# Patient Record
Sex: Male | Born: 1940 | Race: White | Hispanic: No | Marital: Married | Smoking: Former smoker
Health system: Southern US, Community
[De-identification: ages and names within clinical notes are randomized; demographics above are authoritative.]

## PROBLEM LIST (undated history)

## (undated) DIAGNOSIS — I469 Cardiac arrest, cause unspecified: Secondary | ICD-10-CM

## (undated) DIAGNOSIS — M1990 Unspecified osteoarthritis, unspecified site: Secondary | ICD-10-CM

## (undated) DIAGNOSIS — I10 Essential (primary) hypertension: Secondary | ICD-10-CM

## (undated) DIAGNOSIS — M25561 Pain in right knee: Secondary | ICD-10-CM

## (undated) DIAGNOSIS — B1920 Unspecified viral hepatitis C without hepatic coma: Secondary | ICD-10-CM

## (undated) DIAGNOSIS — Z205 Contact with and (suspected) exposure to viral hepatitis: Secondary | ICD-10-CM

## (undated) DIAGNOSIS — E119 Type 2 diabetes mellitus without complications: Secondary | ICD-10-CM

## (undated) DIAGNOSIS — Z9189 Other specified personal risk factors, not elsewhere classified: Secondary | ICD-10-CM

## (undated) DIAGNOSIS — R339 Retention of urine, unspecified: Secondary | ICD-10-CM

## (undated) DIAGNOSIS — K219 Gastro-esophageal reflux disease without esophagitis: Secondary | ICD-10-CM

## (undated) DIAGNOSIS — I2699 Other pulmonary embolism without acute cor pulmonale: Secondary | ICD-10-CM

## (undated) DIAGNOSIS — K7469 Other cirrhosis of liver: Secondary | ICD-10-CM

## (undated) DIAGNOSIS — K759 Inflammatory liver disease, unspecified: Secondary | ICD-10-CM

## (undated) DIAGNOSIS — M1712 Unilateral primary osteoarthritis, left knee: Secondary | ICD-10-CM

## (undated) DIAGNOSIS — I421 Obstructive hypertrophic cardiomyopathy: Secondary | ICD-10-CM

## (undated) DIAGNOSIS — M25562 Pain in left knee: Secondary | ICD-10-CM

## (undated) DIAGNOSIS — Z96659 Presence of unspecified artificial knee joint: Secondary | ICD-10-CM

## (undated) DIAGNOSIS — Z7901 Long term (current) use of anticoagulants: Secondary | ICD-10-CM

## (undated) DIAGNOSIS — I2510 Atherosclerotic heart disease of native coronary artery without angina pectoris: Secondary | ICD-10-CM

## (undated) DIAGNOSIS — N179 Acute kidney failure, unspecified: Secondary | ICD-10-CM

## (undated) HISTORY — DX: Contact with and (suspected) exposure to viral hepatitis: Z20.5

## (undated) HISTORY — PX: TONSILLECTOMY: SUR1361

## (undated) HISTORY — DX: Pain in right knee: M25.561

## (undated) HISTORY — DX: Pain in left knee: M25.562

## (undated) HISTORY — DX: Other specified personal risk factors, not elsewhere classified: Z91.89

## (undated) HISTORY — DX: Essential (primary) hypertension: I10

## (undated) HISTORY — DX: Unilateral primary osteoarthritis, left knee: M17.12

## (undated) HISTORY — DX: Atherosclerotic heart disease of native coronary artery without angina pectoris: I25.10

## (undated) HISTORY — DX: Presence of unspecified artificial knee joint: Z96.659

## (undated) HISTORY — DX: Other cirrhosis of liver: K74.69

## (undated) HISTORY — DX: Obstructive hypertrophic cardiomyopathy: I42.1

## (undated) HISTORY — PX: CARDIAC CATHETERIZATION: SHX172

## (undated) HISTORY — DX: Unspecified viral hepatitis C without hepatic coma: B19.20

## (undated) HISTORY — DX: Acute kidney failure, unspecified: N17.9

## (undated) HISTORY — DX: Cardiac arrest, cause unspecified: I46.9

## (undated) HISTORY — DX: Long term (current) use of anticoagulants: Z79.01

## (undated) HISTORY — DX: Other pulmonary embolism without acute cor pulmonale: I26.99

---

## 2011-02-19 DIAGNOSIS — K2270 Barrett's esophagus without dysplasia: Secondary | ICD-10-CM | POA: Diagnosis not present

## 2011-03-06 DIAGNOSIS — B182 Chronic viral hepatitis C: Secondary | ICD-10-CM | POA: Diagnosis not present

## 2011-03-06 DIAGNOSIS — K449 Diaphragmatic hernia without obstruction or gangrene: Secondary | ICD-10-CM | POA: Diagnosis not present

## 2011-03-06 DIAGNOSIS — Z79899 Other long term (current) drug therapy: Secondary | ICD-10-CM | POA: Diagnosis not present

## 2011-03-06 DIAGNOSIS — Z7982 Long term (current) use of aspirin: Secondary | ICD-10-CM | POA: Diagnosis not present

## 2011-03-06 DIAGNOSIS — K219 Gastro-esophageal reflux disease without esophagitis: Secondary | ICD-10-CM | POA: Diagnosis not present

## 2011-03-06 DIAGNOSIS — Z9861 Coronary angioplasty status: Secondary | ICD-10-CM | POA: Diagnosis not present

## 2011-03-06 DIAGNOSIS — Z7902 Long term (current) use of antithrombotics/antiplatelets: Secondary | ICD-10-CM | POA: Diagnosis not present

## 2011-03-06 DIAGNOSIS — E119 Type 2 diabetes mellitus without complications: Secondary | ICD-10-CM | POA: Diagnosis not present

## 2011-03-06 DIAGNOSIS — I10 Essential (primary) hypertension: Secondary | ICD-10-CM | POA: Diagnosis not present

## 2011-03-06 DIAGNOSIS — K2970 Gastritis, unspecified, without bleeding: Secondary | ICD-10-CM | POA: Diagnosis not present

## 2011-05-07 DIAGNOSIS — E1159 Type 2 diabetes mellitus with other circulatory complications: Secondary | ICD-10-CM | POA: Diagnosis not present

## 2011-05-07 DIAGNOSIS — Z1212 Encounter for screening for malignant neoplasm of rectum: Secondary | ICD-10-CM | POA: Diagnosis not present

## 2011-05-07 DIAGNOSIS — Z125 Encounter for screening for malignant neoplasm of prostate: Secondary | ICD-10-CM | POA: Diagnosis not present

## 2011-05-07 DIAGNOSIS — Z0000 Encounter for general adult medical examination without abnormal findings: Secondary | ICD-10-CM | POA: Diagnosis not present

## 2011-05-07 DIAGNOSIS — Z23 Encounter for immunization: Secondary | ICD-10-CM | POA: Diagnosis not present

## 2011-05-07 DIAGNOSIS — E785 Hyperlipidemia, unspecified: Secondary | ICD-10-CM | POA: Diagnosis not present

## 2011-06-22 DIAGNOSIS — R5383 Other fatigue: Secondary | ICD-10-CM | POA: Diagnosis not present

## 2011-06-22 DIAGNOSIS — R3919 Other difficulties with micturition: Secondary | ICD-10-CM | POA: Diagnosis not present

## 2011-06-22 DIAGNOSIS — E7800 Pure hypercholesterolemia, unspecified: Secondary | ICD-10-CM | POA: Diagnosis not present

## 2011-06-22 DIAGNOSIS — I10 Essential (primary) hypertension: Secondary | ICD-10-CM | POA: Diagnosis not present

## 2011-06-22 DIAGNOSIS — R5381 Other malaise: Secondary | ICD-10-CM | POA: Diagnosis not present

## 2011-06-22 DIAGNOSIS — E119 Type 2 diabetes mellitus without complications: Secondary | ICD-10-CM | POA: Diagnosis not present

## 2011-06-22 DIAGNOSIS — I6789 Other cerebrovascular disease: Secondary | ICD-10-CM | POA: Diagnosis not present

## 2011-06-22 DIAGNOSIS — I2510 Atherosclerotic heart disease of native coronary artery without angina pectoris: Secondary | ICD-10-CM | POA: Diagnosis not present

## 2011-06-22 DIAGNOSIS — R55 Syncope and collapse: Secondary | ICD-10-CM | POA: Diagnosis not present

## 2011-06-22 DIAGNOSIS — R6889 Other general symptoms and signs: Secondary | ICD-10-CM | POA: Diagnosis not present

## 2011-06-22 DIAGNOSIS — I252 Old myocardial infarction: Secondary | ICD-10-CM | POA: Diagnosis not present

## 2011-06-23 DIAGNOSIS — Z6827 Body mass index (BMI) 27.0-27.9, adult: Secondary | ICD-10-CM | POA: Diagnosis not present

## 2011-06-23 DIAGNOSIS — E119 Type 2 diabetes mellitus without complications: Secondary | ICD-10-CM | POA: Diagnosis not present

## 2011-06-23 DIAGNOSIS — R55 Syncope and collapse: Secondary | ICD-10-CM | POA: Diagnosis not present

## 2011-06-23 DIAGNOSIS — I10 Essential (primary) hypertension: Secondary | ICD-10-CM | POA: Diagnosis not present

## 2011-06-25 DIAGNOSIS — B1920 Unspecified viral hepatitis C without hepatic coma: Secondary | ICD-10-CM | POA: Diagnosis not present

## 2011-06-25 DIAGNOSIS — B182 Chronic viral hepatitis C: Secondary | ICD-10-CM | POA: Diagnosis not present

## 2011-06-26 DIAGNOSIS — D539 Nutritional anemia, unspecified: Secondary | ICD-10-CM | POA: Diagnosis not present

## 2011-06-26 DIAGNOSIS — R0609 Other forms of dyspnea: Secondary | ICD-10-CM | POA: Diagnosis not present

## 2011-06-26 DIAGNOSIS — R55 Syncope and collapse: Secondary | ICD-10-CM | POA: Diagnosis not present

## 2011-06-27 DIAGNOSIS — I421 Obstructive hypertrophic cardiomyopathy: Secondary | ICD-10-CM | POA: Diagnosis not present

## 2011-06-27 DIAGNOSIS — R55 Syncope and collapse: Secondary | ICD-10-CM | POA: Diagnosis not present

## 2011-06-27 DIAGNOSIS — I209 Angina pectoris, unspecified: Secondary | ICD-10-CM | POA: Diagnosis not present

## 2011-06-27 DIAGNOSIS — E119 Type 2 diabetes mellitus without complications: Secondary | ICD-10-CM | POA: Diagnosis not present

## 2011-06-30 DIAGNOSIS — R55 Syncope and collapse: Secondary | ICD-10-CM | POA: Diagnosis not present

## 2011-06-30 DIAGNOSIS — R0989 Other specified symptoms and signs involving the circulatory and respiratory systems: Secondary | ICD-10-CM | POA: Diagnosis not present

## 2011-06-30 DIAGNOSIS — I059 Rheumatic mitral valve disease, unspecified: Secondary | ICD-10-CM | POA: Diagnosis not present

## 2011-06-30 DIAGNOSIS — R0609 Other forms of dyspnea: Secondary | ICD-10-CM | POA: Diagnosis not present

## 2011-06-30 DIAGNOSIS — Z8249 Family history of ischemic heart disease and other diseases of the circulatory system: Secondary | ICD-10-CM | POA: Diagnosis not present

## 2011-06-30 DIAGNOSIS — E119 Type 2 diabetes mellitus without complications: Secondary | ICD-10-CM | POA: Diagnosis not present

## 2011-06-30 DIAGNOSIS — I421 Obstructive hypertrophic cardiomyopathy: Secondary | ICD-10-CM | POA: Diagnosis not present

## 2011-06-30 DIAGNOSIS — I10 Essential (primary) hypertension: Secondary | ICD-10-CM | POA: Diagnosis not present

## 2011-06-30 DIAGNOSIS — Z9861 Coronary angioplasty status: Secondary | ICD-10-CM | POA: Diagnosis not present

## 2011-06-30 DIAGNOSIS — Z79899 Other long term (current) drug therapy: Secondary | ICD-10-CM | POA: Diagnosis not present

## 2011-06-30 DIAGNOSIS — D649 Anemia, unspecified: Secondary | ICD-10-CM | POA: Diagnosis not present

## 2011-06-30 DIAGNOSIS — E785 Hyperlipidemia, unspecified: Secondary | ICD-10-CM | POA: Diagnosis not present

## 2011-06-30 DIAGNOSIS — Z7982 Long term (current) use of aspirin: Secondary | ICD-10-CM | POA: Diagnosis not present

## 2011-06-30 DIAGNOSIS — I2510 Atherosclerotic heart disease of native coronary artery without angina pectoris: Secondary | ICD-10-CM | POA: Diagnosis not present

## 2011-06-30 DIAGNOSIS — I209 Angina pectoris, unspecified: Secondary | ICD-10-CM | POA: Diagnosis not present

## 2011-07-02 DIAGNOSIS — R55 Syncope and collapse: Secondary | ICD-10-CM | POA: Diagnosis not present

## 2011-07-03 DIAGNOSIS — R55 Syncope and collapse: Secondary | ICD-10-CM | POA: Diagnosis not present

## 2011-07-03 DIAGNOSIS — I2510 Atherosclerotic heart disease of native coronary artery without angina pectoris: Secondary | ICD-10-CM | POA: Diagnosis not present

## 2011-07-03 DIAGNOSIS — I421 Obstructive hypertrophic cardiomyopathy: Secondary | ICD-10-CM | POA: Diagnosis not present

## 2011-07-03 DIAGNOSIS — E785 Hyperlipidemia, unspecified: Secondary | ICD-10-CM | POA: Diagnosis not present

## 2011-07-08 DIAGNOSIS — Z6826 Body mass index (BMI) 26.0-26.9, adult: Secondary | ICD-10-CM | POA: Diagnosis not present

## 2011-07-08 DIAGNOSIS — I10 Essential (primary) hypertension: Secondary | ICD-10-CM | POA: Diagnosis not present

## 2011-07-18 DIAGNOSIS — A419 Sepsis, unspecified organism: Secondary | ICD-10-CM | POA: Diagnosis not present

## 2011-07-18 DIAGNOSIS — R6520 Severe sepsis without septic shock: Secondary | ICD-10-CM | POA: Diagnosis not present

## 2011-07-18 DIAGNOSIS — D649 Anemia, unspecified: Secondary | ICD-10-CM | POA: Diagnosis not present

## 2011-07-18 DIAGNOSIS — I369 Nonrheumatic tricuspid valve disorder, unspecified: Secondary | ICD-10-CM | POA: Diagnosis not present

## 2011-07-18 DIAGNOSIS — D61818 Other pancytopenia: Secondary | ICD-10-CM | POA: Diagnosis not present

## 2011-07-18 DIAGNOSIS — E1159 Type 2 diabetes mellitus with other circulatory complications: Secondary | ICD-10-CM | POA: Diagnosis not present

## 2011-07-18 DIAGNOSIS — I059 Rheumatic mitral valve disease, unspecified: Secondary | ICD-10-CM | POA: Diagnosis not present

## 2011-07-18 DIAGNOSIS — E785 Hyperlipidemia, unspecified: Secondary | ICD-10-CM | POA: Diagnosis present

## 2011-07-18 DIAGNOSIS — J9620 Acute and chronic respiratory failure, unspecified whether with hypoxia or hypercapnia: Secondary | ICD-10-CM | POA: Diagnosis not present

## 2011-07-18 DIAGNOSIS — B182 Chronic viral hepatitis C: Secondary | ICD-10-CM | POA: Diagnosis not present

## 2011-07-18 DIAGNOSIS — E119 Type 2 diabetes mellitus without complications: Secondary | ICD-10-CM | POA: Diagnosis not present

## 2011-07-18 DIAGNOSIS — N289 Disorder of kidney and ureter, unspecified: Secondary | ICD-10-CM | POA: Diagnosis not present

## 2011-07-18 DIAGNOSIS — R079 Chest pain, unspecified: Secondary | ICD-10-CM | POA: Diagnosis not present

## 2011-07-18 DIAGNOSIS — J9610 Chronic respiratory failure, unspecified whether with hypoxia or hypercapnia: Secondary | ICD-10-CM | POA: Diagnosis not present

## 2011-07-18 DIAGNOSIS — J449 Chronic obstructive pulmonary disease, unspecified: Secondary | ICD-10-CM | POA: Diagnosis not present

## 2011-07-18 DIAGNOSIS — D509 Iron deficiency anemia, unspecified: Secondary | ICD-10-CM | POA: Diagnosis not present

## 2011-07-18 DIAGNOSIS — I359 Nonrheumatic aortic valve disorder, unspecified: Secondary | ICD-10-CM | POA: Diagnosis not present

## 2011-07-18 DIAGNOSIS — I10 Essential (primary) hypertension: Secondary | ICD-10-CM | POA: Diagnosis present

## 2011-07-18 DIAGNOSIS — A779 Spotted fever, unspecified: Secondary | ICD-10-CM | POA: Diagnosis not present

## 2011-07-18 DIAGNOSIS — Z9861 Coronary angioplasty status: Secondary | ICD-10-CM | POA: Diagnosis not present

## 2011-07-18 DIAGNOSIS — I2510 Atherosclerotic heart disease of native coronary artery without angina pectoris: Secondary | ICD-10-CM | POA: Diagnosis not present

## 2011-07-18 DIAGNOSIS — D61810 Antineoplastic chemotherapy induced pancytopenia: Secondary | ICD-10-CM | POA: Diagnosis not present

## 2011-07-18 DIAGNOSIS — R509 Fever, unspecified: Secondary | ICD-10-CM | POA: Diagnosis not present

## 2011-07-18 DIAGNOSIS — J4489 Other specified chronic obstructive pulmonary disease: Secondary | ICD-10-CM | POA: Diagnosis not present

## 2011-07-18 DIAGNOSIS — D696 Thrombocytopenia, unspecified: Secondary | ICD-10-CM | POA: Diagnosis not present

## 2011-07-18 DIAGNOSIS — D469 Myelodysplastic syndrome, unspecified: Secondary | ICD-10-CM | POA: Diagnosis not present

## 2011-07-21 ENCOUNTER — Other Ambulatory Visit (HOSPITAL_COMMUNITY): Admission: RE | Admit: 2011-07-21 | Payer: Self-pay | Source: Ambulatory Visit | Admitting: Oncology

## 2011-07-21 DIAGNOSIS — D649 Anemia, unspecified: Secondary | ICD-10-CM | POA: Diagnosis not present

## 2011-07-30 DIAGNOSIS — A779 Spotted fever, unspecified: Secondary | ICD-10-CM | POA: Diagnosis not present

## 2011-07-30 DIAGNOSIS — Z6825 Body mass index (BMI) 25.0-25.9, adult: Secondary | ICD-10-CM | POA: Diagnosis not present

## 2011-07-31 DIAGNOSIS — R55 Syncope and collapse: Secondary | ICD-10-CM | POA: Diagnosis not present

## 2011-08-05 DIAGNOSIS — I2510 Atherosclerotic heart disease of native coronary artery without angina pectoris: Secondary | ICD-10-CM | POA: Diagnosis not present

## 2011-08-05 DIAGNOSIS — Q068 Other specified congenital malformations of spinal cord: Secondary | ICD-10-CM | POA: Diagnosis not present

## 2011-08-05 DIAGNOSIS — D61818 Other pancytopenia: Secondary | ICD-10-CM | POA: Diagnosis not present

## 2011-08-05 DIAGNOSIS — I421 Obstructive hypertrophic cardiomyopathy: Secondary | ICD-10-CM | POA: Diagnosis not present

## 2011-08-07 DIAGNOSIS — D61818 Other pancytopenia: Secondary | ICD-10-CM | POA: Diagnosis not present

## 2011-08-07 DIAGNOSIS — D649 Anemia, unspecified: Secondary | ICD-10-CM | POA: Diagnosis not present

## 2011-08-22 DIAGNOSIS — E785 Hyperlipidemia, unspecified: Secondary | ICD-10-CM | POA: Diagnosis not present

## 2011-08-22 DIAGNOSIS — I10 Essential (primary) hypertension: Secondary | ICD-10-CM | POA: Diagnosis not present

## 2011-08-22 DIAGNOSIS — Z6825 Body mass index (BMI) 25.0-25.9, adult: Secondary | ICD-10-CM | POA: Diagnosis not present

## 2011-08-22 DIAGNOSIS — E119 Type 2 diabetes mellitus without complications: Secondary | ICD-10-CM | POA: Diagnosis not present

## 2011-10-16 DIAGNOSIS — D61818 Other pancytopenia: Secondary | ICD-10-CM | POA: Diagnosis not present

## 2011-10-16 DIAGNOSIS — B1920 Unspecified viral hepatitis C without hepatic coma: Secondary | ICD-10-CM | POA: Diagnosis not present

## 2011-10-28 DIAGNOSIS — H35379 Puckering of macula, unspecified eye: Secondary | ICD-10-CM | POA: Diagnosis not present

## 2011-10-28 DIAGNOSIS — H538 Other visual disturbances: Secondary | ICD-10-CM | POA: Diagnosis not present

## 2011-11-18 DIAGNOSIS — H35379 Puckering of macula, unspecified eye: Secondary | ICD-10-CM | POA: Diagnosis not present

## 2011-11-24 DIAGNOSIS — I10 Essential (primary) hypertension: Secondary | ICD-10-CM | POA: Diagnosis not present

## 2011-11-24 DIAGNOSIS — D539 Nutritional anemia, unspecified: Secondary | ICD-10-CM | POA: Diagnosis not present

## 2011-11-24 DIAGNOSIS — E785 Hyperlipidemia, unspecified: Secondary | ICD-10-CM | POA: Diagnosis not present

## 2011-11-24 DIAGNOSIS — E119 Type 2 diabetes mellitus without complications: Secondary | ICD-10-CM | POA: Diagnosis not present

## 2011-11-25 DIAGNOSIS — I2510 Atherosclerotic heart disease of native coronary artery without angina pectoris: Secondary | ICD-10-CM | POA: Diagnosis not present

## 2011-11-25 DIAGNOSIS — I10 Essential (primary) hypertension: Secondary | ICD-10-CM | POA: Diagnosis not present

## 2011-11-25 DIAGNOSIS — I421 Obstructive hypertrophic cardiomyopathy: Secondary | ICD-10-CM | POA: Diagnosis not present

## 2011-12-23 DIAGNOSIS — Z23 Encounter for immunization: Secondary | ICD-10-CM | POA: Diagnosis not present

## 2011-12-23 DIAGNOSIS — B1920 Unspecified viral hepatitis C without hepatic coma: Secondary | ICD-10-CM | POA: Diagnosis not present

## 2011-12-23 DIAGNOSIS — Z79899 Other long term (current) drug therapy: Secondary | ICD-10-CM | POA: Diagnosis not present

## 2012-01-06 DIAGNOSIS — H348392 Tributary (branch) retinal vein occlusion, unspecified eye, stable: Secondary | ICD-10-CM | POA: Diagnosis not present

## 2012-02-26 DIAGNOSIS — E119 Type 2 diabetes mellitus without complications: Secondary | ICD-10-CM | POA: Diagnosis not present

## 2012-02-26 DIAGNOSIS — I10 Essential (primary) hypertension: Secondary | ICD-10-CM | POA: Diagnosis not present

## 2012-02-26 DIAGNOSIS — E785 Hyperlipidemia, unspecified: Secondary | ICD-10-CM | POA: Diagnosis not present

## 2012-02-26 DIAGNOSIS — Z6826 Body mass index (BMI) 26.0-26.9, adult: Secondary | ICD-10-CM | POA: Diagnosis not present

## 2012-03-04 DIAGNOSIS — I10 Essential (primary) hypertension: Secondary | ICD-10-CM | POA: Diagnosis not present

## 2012-03-10 DIAGNOSIS — K219 Gastro-esophageal reflux disease without esophagitis: Secondary | ICD-10-CM | POA: Diagnosis not present

## 2012-04-07 DIAGNOSIS — H35379 Puckering of macula, unspecified eye: Secondary | ICD-10-CM | POA: Diagnosis not present

## 2012-05-10 DIAGNOSIS — T148 Other injury of unspecified body region: Secondary | ICD-10-CM | POA: Diagnosis not present

## 2012-05-10 DIAGNOSIS — Z6827 Body mass index (BMI) 27.0-27.9, adult: Secondary | ICD-10-CM | POA: Diagnosis not present

## 2012-05-10 DIAGNOSIS — W57XXXA Bitten or stung by nonvenomous insect and other nonvenomous arthropods, initial encounter: Secondary | ICD-10-CM | POA: Diagnosis not present

## 2012-06-02 DIAGNOSIS — I10 Essential (primary) hypertension: Secondary | ICD-10-CM | POA: Diagnosis not present

## 2012-06-02 DIAGNOSIS — E785 Hyperlipidemia, unspecified: Secondary | ICD-10-CM | POA: Diagnosis not present

## 2012-06-02 DIAGNOSIS — E119 Type 2 diabetes mellitus without complications: Secondary | ICD-10-CM | POA: Diagnosis not present

## 2012-06-02 DIAGNOSIS — Z1331 Encounter for screening for depression: Secondary | ICD-10-CM | POA: Diagnosis not present

## 2012-06-02 DIAGNOSIS — Z6827 Body mass index (BMI) 27.0-27.9, adult: Secondary | ICD-10-CM | POA: Diagnosis not present

## 2012-06-02 DIAGNOSIS — Z9181 History of falling: Secondary | ICD-10-CM | POA: Diagnosis not present

## 2012-06-15 DIAGNOSIS — L570 Actinic keratosis: Secondary | ICD-10-CM | POA: Diagnosis not present

## 2012-06-15 DIAGNOSIS — L821 Other seborrheic keratosis: Secondary | ICD-10-CM | POA: Diagnosis not present

## 2012-06-22 DIAGNOSIS — B182 Chronic viral hepatitis C: Secondary | ICD-10-CM | POA: Diagnosis not present

## 2012-06-22 DIAGNOSIS — Z79899 Other long term (current) drug therapy: Secondary | ICD-10-CM | POA: Diagnosis not present

## 2012-06-22 DIAGNOSIS — Z5181 Encounter for therapeutic drug level monitoring: Secondary | ICD-10-CM | POA: Diagnosis not present

## 2012-06-22 DIAGNOSIS — B1920 Unspecified viral hepatitis C without hepatic coma: Secondary | ICD-10-CM | POA: Diagnosis not present

## 2012-06-22 DIAGNOSIS — E119 Type 2 diabetes mellitus without complications: Secondary | ICD-10-CM | POA: Diagnosis not present

## 2012-06-22 DIAGNOSIS — I2510 Atherosclerotic heart disease of native coronary artery without angina pectoris: Secondary | ICD-10-CM | POA: Diagnosis not present

## 2012-07-06 DIAGNOSIS — H35379 Puckering of macula, unspecified eye: Secondary | ICD-10-CM | POA: Diagnosis not present

## 2012-09-10 DIAGNOSIS — I10 Essential (primary) hypertension: Secondary | ICD-10-CM | POA: Diagnosis not present

## 2012-09-10 DIAGNOSIS — D539 Nutritional anemia, unspecified: Secondary | ICD-10-CM | POA: Diagnosis not present

## 2012-09-10 DIAGNOSIS — E785 Hyperlipidemia, unspecified: Secondary | ICD-10-CM | POA: Diagnosis not present

## 2012-09-10 DIAGNOSIS — E119 Type 2 diabetes mellitus without complications: Secondary | ICD-10-CM | POA: Diagnosis not present

## 2012-10-13 DIAGNOSIS — H35379 Puckering of macula, unspecified eye: Secondary | ICD-10-CM | POA: Diagnosis not present

## 2012-11-23 DIAGNOSIS — I10 Essential (primary) hypertension: Secondary | ICD-10-CM | POA: Diagnosis not present

## 2012-11-23 DIAGNOSIS — E785 Hyperlipidemia, unspecified: Secondary | ICD-10-CM | POA: Diagnosis not present

## 2012-11-23 DIAGNOSIS — I2510 Atherosclerotic heart disease of native coronary artery without angina pectoris: Secondary | ICD-10-CM | POA: Diagnosis not present

## 2012-11-23 DIAGNOSIS — E119 Type 2 diabetes mellitus without complications: Secondary | ICD-10-CM | POA: Diagnosis not present

## 2012-11-23 DIAGNOSIS — I421 Obstructive hypertrophic cardiomyopathy: Secondary | ICD-10-CM | POA: Diagnosis not present

## 2012-12-16 DIAGNOSIS — I10 Essential (primary) hypertension: Secondary | ICD-10-CM | POA: Diagnosis not present

## 2012-12-16 DIAGNOSIS — E785 Hyperlipidemia, unspecified: Secondary | ICD-10-CM | POA: Diagnosis not present

## 2012-12-16 DIAGNOSIS — Z6831 Body mass index (BMI) 31.0-31.9, adult: Secondary | ICD-10-CM | POA: Diagnosis not present

## 2012-12-16 DIAGNOSIS — E119 Type 2 diabetes mellitus without complications: Secondary | ICD-10-CM | POA: Diagnosis not present

## 2012-12-16 DIAGNOSIS — D539 Nutritional anemia, unspecified: Secondary | ICD-10-CM | POA: Diagnosis not present

## 2013-01-11 DIAGNOSIS — B1920 Unspecified viral hepatitis C without hepatic coma: Secondary | ICD-10-CM | POA: Diagnosis not present

## 2013-01-11 DIAGNOSIS — B182 Chronic viral hepatitis C: Secondary | ICD-10-CM | POA: Diagnosis not present

## 2013-01-13 DIAGNOSIS — H35379 Puckering of macula, unspecified eye: Secondary | ICD-10-CM | POA: Diagnosis not present

## 2013-03-10 DIAGNOSIS — K219 Gastro-esophageal reflux disease without esophagitis: Secondary | ICD-10-CM | POA: Diagnosis not present

## 2013-03-10 DIAGNOSIS — B182 Chronic viral hepatitis C: Secondary | ICD-10-CM | POA: Diagnosis not present

## 2013-03-10 DIAGNOSIS — K2270 Barrett's esophagus without dysplasia: Secondary | ICD-10-CM | POA: Diagnosis not present

## 2013-03-24 DIAGNOSIS — D539 Nutritional anemia, unspecified: Secondary | ICD-10-CM | POA: Diagnosis not present

## 2013-03-24 DIAGNOSIS — E119 Type 2 diabetes mellitus without complications: Secondary | ICD-10-CM | POA: Diagnosis not present

## 2013-03-24 DIAGNOSIS — Z6832 Body mass index (BMI) 32.0-32.9, adult: Secondary | ICD-10-CM | POA: Diagnosis not present

## 2013-03-24 DIAGNOSIS — E785 Hyperlipidemia, unspecified: Secondary | ICD-10-CM | POA: Diagnosis not present

## 2013-03-24 DIAGNOSIS — I10 Essential (primary) hypertension: Secondary | ICD-10-CM | POA: Diagnosis not present

## 2013-05-18 DIAGNOSIS — H35379 Puckering of macula, unspecified eye: Secondary | ICD-10-CM | POA: Diagnosis not present

## 2013-05-18 DIAGNOSIS — H524 Presbyopia: Secondary | ICD-10-CM | POA: Diagnosis not present

## 2013-06-15 DIAGNOSIS — B182 Chronic viral hepatitis C: Secondary | ICD-10-CM | POA: Diagnosis not present

## 2013-06-22 DIAGNOSIS — T148 Other injury of unspecified body region: Secondary | ICD-10-CM | POA: Diagnosis not present

## 2013-06-22 DIAGNOSIS — Z6831 Body mass index (BMI) 31.0-31.9, adult: Secondary | ICD-10-CM | POA: Diagnosis not present

## 2013-06-22 DIAGNOSIS — W57XXXA Bitten or stung by nonvenomous insect and other nonvenomous arthropods, initial encounter: Secondary | ICD-10-CM | POA: Diagnosis not present

## 2013-07-05 DIAGNOSIS — I2510 Atherosclerotic heart disease of native coronary artery without angina pectoris: Secondary | ICD-10-CM | POA: Diagnosis not present

## 2013-07-05 DIAGNOSIS — E119 Type 2 diabetes mellitus without complications: Secondary | ICD-10-CM | POA: Diagnosis not present

## 2013-07-05 DIAGNOSIS — F17200 Nicotine dependence, unspecified, uncomplicated: Secondary | ICD-10-CM | POA: Diagnosis not present

## 2013-07-05 DIAGNOSIS — Z7982 Long term (current) use of aspirin: Secondary | ICD-10-CM | POA: Diagnosis not present

## 2013-07-05 DIAGNOSIS — I10 Essential (primary) hypertension: Secondary | ICD-10-CM | POA: Diagnosis not present

## 2013-07-05 DIAGNOSIS — Z79899 Other long term (current) drug therapy: Secondary | ICD-10-CM | POA: Diagnosis not present

## 2013-07-05 DIAGNOSIS — Z7902 Long term (current) use of antithrombotics/antiplatelets: Secondary | ICD-10-CM | POA: Diagnosis not present

## 2013-07-05 DIAGNOSIS — K219 Gastro-esophageal reflux disease without esophagitis: Secondary | ICD-10-CM | POA: Diagnosis not present

## 2013-07-05 DIAGNOSIS — R040 Epistaxis: Secondary | ICD-10-CM | POA: Diagnosis not present

## 2013-07-05 DIAGNOSIS — E7800 Pure hypercholesterolemia, unspecified: Secondary | ICD-10-CM | POA: Diagnosis not present

## 2013-07-06 DIAGNOSIS — D649 Anemia, unspecified: Secondary | ICD-10-CM | POA: Diagnosis not present

## 2013-07-06 DIAGNOSIS — I2510 Atherosclerotic heart disease of native coronary artery without angina pectoris: Secondary | ICD-10-CM | POA: Diagnosis not present

## 2013-07-06 DIAGNOSIS — R040 Epistaxis: Secondary | ICD-10-CM | POA: Diagnosis not present

## 2013-07-08 DIAGNOSIS — J342 Deviated nasal septum: Secondary | ICD-10-CM | POA: Diagnosis not present

## 2013-07-08 DIAGNOSIS — Z7982 Long term (current) use of aspirin: Secondary | ICD-10-CM | POA: Diagnosis not present

## 2013-07-08 DIAGNOSIS — R040 Epistaxis: Secondary | ICD-10-CM | POA: Diagnosis not present

## 2013-07-08 DIAGNOSIS — Z7901 Long term (current) use of anticoagulants: Secondary | ICD-10-CM | POA: Diagnosis not present

## 2013-07-11 DIAGNOSIS — Z8709 Personal history of other diseases of the respiratory system: Secondary | ICD-10-CM | POA: Diagnosis not present

## 2013-07-11 DIAGNOSIS — I10 Essential (primary) hypertension: Secondary | ICD-10-CM | POA: Diagnosis not present

## 2013-07-11 DIAGNOSIS — R040 Epistaxis: Secondary | ICD-10-CM | POA: Diagnosis not present

## 2013-07-11 DIAGNOSIS — I2510 Atherosclerotic heart disease of native coronary artery without angina pectoris: Secondary | ICD-10-CM | POA: Diagnosis not present

## 2013-07-11 DIAGNOSIS — D649 Anemia, unspecified: Secondary | ICD-10-CM | POA: Diagnosis not present

## 2013-07-13 DIAGNOSIS — B182 Chronic viral hepatitis C: Secondary | ICD-10-CM | POA: Diagnosis not present

## 2013-07-14 DIAGNOSIS — I359 Nonrheumatic aortic valve disorder, unspecified: Secondary | ICD-10-CM | POA: Diagnosis not present

## 2013-07-14 DIAGNOSIS — I421 Obstructive hypertrophic cardiomyopathy: Secondary | ICD-10-CM | POA: Diagnosis not present

## 2013-07-14 DIAGNOSIS — I2510 Atherosclerotic heart disease of native coronary artery without angina pectoris: Secondary | ICD-10-CM | POA: Diagnosis not present

## 2013-09-01 DIAGNOSIS — I10 Essential (primary) hypertension: Secondary | ICD-10-CM | POA: Diagnosis not present

## 2013-09-01 DIAGNOSIS — D539 Nutritional anemia, unspecified: Secondary | ICD-10-CM | POA: Diagnosis not present

## 2013-09-01 DIAGNOSIS — E785 Hyperlipidemia, unspecified: Secondary | ICD-10-CM | POA: Diagnosis not present

## 2013-09-01 DIAGNOSIS — E119 Type 2 diabetes mellitus without complications: Secondary | ICD-10-CM | POA: Diagnosis not present

## 2013-09-01 DIAGNOSIS — Z6831 Body mass index (BMI) 31.0-31.9, adult: Secondary | ICD-10-CM | POA: Diagnosis not present

## 2013-09-07 DIAGNOSIS — B182 Chronic viral hepatitis C: Secondary | ICD-10-CM | POA: Diagnosis not present

## 2013-09-26 DIAGNOSIS — I421 Obstructive hypertrophic cardiomyopathy: Secondary | ICD-10-CM | POA: Diagnosis not present

## 2013-10-10 DIAGNOSIS — Z6832 Body mass index (BMI) 32.0-32.9, adult: Secondary | ICD-10-CM | POA: Diagnosis not present

## 2013-10-10 DIAGNOSIS — W57XXXA Bitten or stung by nonvenomous insect and other nonvenomous arthropods, initial encounter: Secondary | ICD-10-CM | POA: Diagnosis not present

## 2013-10-10 DIAGNOSIS — T148 Other injury of unspecified body region: Secondary | ICD-10-CM | POA: Diagnosis not present

## 2013-10-27 DIAGNOSIS — L570 Actinic keratosis: Secondary | ICD-10-CM | POA: Diagnosis not present

## 2013-10-27 DIAGNOSIS — L821 Other seborrheic keratosis: Secondary | ICD-10-CM | POA: Diagnosis not present

## 2013-11-02 DIAGNOSIS — Z23 Encounter for immunization: Secondary | ICD-10-CM | POA: Diagnosis not present

## 2013-11-02 DIAGNOSIS — B182 Chronic viral hepatitis C: Secondary | ICD-10-CM | POA: Diagnosis not present

## 2013-11-24 DIAGNOSIS — E119 Type 2 diabetes mellitus without complications: Secondary | ICD-10-CM | POA: Diagnosis not present

## 2013-11-24 DIAGNOSIS — H35372 Puckering of macula, left eye: Secondary | ICD-10-CM | POA: Diagnosis not present

## 2013-11-24 DIAGNOSIS — H25813 Combined forms of age-related cataract, bilateral: Secondary | ICD-10-CM | POA: Diagnosis not present

## 2014-01-12 DIAGNOSIS — I10 Essential (primary) hypertension: Secondary | ICD-10-CM | POA: Diagnosis not present

## 2014-01-12 DIAGNOSIS — E119 Type 2 diabetes mellitus without complications: Secondary | ICD-10-CM | POA: Diagnosis not present

## 2014-01-12 DIAGNOSIS — Z6832 Body mass index (BMI) 32.0-32.9, adult: Secondary | ICD-10-CM | POA: Diagnosis not present

## 2014-01-12 DIAGNOSIS — D539 Nutritional anemia, unspecified: Secondary | ICD-10-CM | POA: Diagnosis not present

## 2014-01-12 DIAGNOSIS — E785 Hyperlipidemia, unspecified: Secondary | ICD-10-CM | POA: Diagnosis not present

## 2014-01-12 DIAGNOSIS — Z1389 Encounter for screening for other disorder: Secondary | ICD-10-CM | POA: Diagnosis not present

## 2014-01-12 DIAGNOSIS — Z9181 History of falling: Secondary | ICD-10-CM | POA: Diagnosis not present

## 2014-01-24 DIAGNOSIS — B182 Chronic viral hepatitis C: Secondary | ICD-10-CM | POA: Diagnosis not present

## 2014-01-26 DIAGNOSIS — L300 Nummular dermatitis: Secondary | ICD-10-CM | POA: Diagnosis not present

## 2014-01-26 DIAGNOSIS — L570 Actinic keratosis: Secondary | ICD-10-CM | POA: Diagnosis not present

## 2014-02-21 DIAGNOSIS — L570 Actinic keratosis: Secondary | ICD-10-CM | POA: Diagnosis not present

## 2014-03-02 DIAGNOSIS — B182 Chronic viral hepatitis C: Secondary | ICD-10-CM | POA: Diagnosis not present

## 2014-03-02 DIAGNOSIS — R161 Splenomegaly, not elsewhere classified: Secondary | ICD-10-CM | POA: Diagnosis not present

## 2014-03-02 DIAGNOSIS — K7460 Unspecified cirrhosis of liver: Secondary | ICD-10-CM | POA: Diagnosis not present

## 2014-03-06 DIAGNOSIS — H01003 Unspecified blepharitis right eye, unspecified eyelid: Secondary | ICD-10-CM | POA: Diagnosis not present

## 2014-03-09 DIAGNOSIS — K219 Gastro-esophageal reflux disease without esophagitis: Secondary | ICD-10-CM | POA: Diagnosis not present

## 2014-03-09 DIAGNOSIS — K5730 Diverticulosis of large intestine without perforation or abscess without bleeding: Secondary | ICD-10-CM | POA: Diagnosis not present

## 2014-03-09 DIAGNOSIS — B182 Chronic viral hepatitis C: Secondary | ICD-10-CM | POA: Diagnosis not present

## 2014-03-09 DIAGNOSIS — K2270 Barrett's esophagus without dysplasia: Secondary | ICD-10-CM | POA: Diagnosis not present

## 2014-03-21 DIAGNOSIS — K209 Esophagitis, unspecified: Secondary | ICD-10-CM | POA: Diagnosis not present

## 2014-03-21 DIAGNOSIS — K449 Diaphragmatic hernia without obstruction or gangrene: Secondary | ICD-10-CM | POA: Diagnosis not present

## 2014-03-21 DIAGNOSIS — I10 Essential (primary) hypertension: Secondary | ICD-10-CM | POA: Diagnosis not present

## 2014-03-21 DIAGNOSIS — B1920 Unspecified viral hepatitis C without hepatic coma: Secondary | ICD-10-CM | POA: Diagnosis not present

## 2014-03-21 DIAGNOSIS — K769 Liver disease, unspecified: Secondary | ICD-10-CM | POA: Diagnosis not present

## 2014-03-21 DIAGNOSIS — Z9861 Coronary angioplasty status: Secondary | ICD-10-CM | POA: Diagnosis not present

## 2014-03-21 DIAGNOSIS — I519 Heart disease, unspecified: Secondary | ICD-10-CM | POA: Diagnosis not present

## 2014-03-21 DIAGNOSIS — E119 Type 2 diabetes mellitus without complications: Secondary | ICD-10-CM | POA: Diagnosis not present

## 2014-03-21 DIAGNOSIS — K2270 Barrett's esophagus without dysplasia: Secondary | ICD-10-CM | POA: Diagnosis not present

## 2014-03-21 DIAGNOSIS — K219 Gastro-esophageal reflux disease without esophagitis: Secondary | ICD-10-CM | POA: Diagnosis not present

## 2014-04-14 DIAGNOSIS — H9313 Tinnitus, bilateral: Secondary | ICD-10-CM | POA: Diagnosis not present

## 2014-04-14 DIAGNOSIS — J342 Deviated nasal septum: Secondary | ICD-10-CM | POA: Diagnosis not present

## 2014-04-14 DIAGNOSIS — H6123 Impacted cerumen, bilateral: Secondary | ICD-10-CM | POA: Diagnosis not present

## 2014-05-18 DIAGNOSIS — Z125 Encounter for screening for malignant neoplasm of prostate: Secondary | ICD-10-CM | POA: Diagnosis not present

## 2014-05-18 DIAGNOSIS — Z6831 Body mass index (BMI) 31.0-31.9, adult: Secondary | ICD-10-CM | POA: Diagnosis not present

## 2014-05-18 DIAGNOSIS — E785 Hyperlipidemia, unspecified: Secondary | ICD-10-CM | POA: Diagnosis not present

## 2014-05-18 DIAGNOSIS — D539 Nutritional anemia, unspecified: Secondary | ICD-10-CM | POA: Diagnosis not present

## 2014-05-18 DIAGNOSIS — E119 Type 2 diabetes mellitus without complications: Secondary | ICD-10-CM | POA: Diagnosis not present

## 2014-05-18 DIAGNOSIS — R413 Other amnesia: Secondary | ICD-10-CM | POA: Diagnosis not present

## 2014-05-25 DIAGNOSIS — R413 Other amnesia: Secondary | ICD-10-CM | POA: Diagnosis not present

## 2014-06-07 DIAGNOSIS — I10 Essential (primary) hypertension: Secondary | ICD-10-CM | POA: Diagnosis not present

## 2014-06-07 DIAGNOSIS — I2510 Atherosclerotic heart disease of native coronary artery without angina pectoris: Secondary | ICD-10-CM | POA: Diagnosis not present

## 2014-06-07 DIAGNOSIS — E785 Hyperlipidemia, unspecified: Secondary | ICD-10-CM | POA: Diagnosis not present

## 2014-06-15 DIAGNOSIS — M1712 Unilateral primary osteoarthritis, left knee: Secondary | ICD-10-CM | POA: Diagnosis not present

## 2014-06-15 DIAGNOSIS — M1711 Unilateral primary osteoarthritis, right knee: Secondary | ICD-10-CM | POA: Diagnosis not present

## 2014-06-15 DIAGNOSIS — M25561 Pain in right knee: Secondary | ICD-10-CM

## 2014-06-15 DIAGNOSIS — M25461 Effusion, right knee: Secondary | ICD-10-CM | POA: Diagnosis not present

## 2014-06-15 DIAGNOSIS — M25462 Effusion, left knee: Secondary | ICD-10-CM | POA: Diagnosis not present

## 2014-06-15 DIAGNOSIS — M25562 Pain in left knee: Secondary | ICD-10-CM

## 2014-06-15 HISTORY — DX: Pain in right knee: M25.562

## 2014-06-15 HISTORY — DX: Pain in right knee: M25.561

## 2014-06-19 DIAGNOSIS — Z0000 Encounter for general adult medical examination without abnormal findings: Secondary | ICD-10-CM | POA: Diagnosis not present

## 2014-06-19 DIAGNOSIS — Z1212 Encounter for screening for malignant neoplasm of rectum: Secondary | ICD-10-CM | POA: Diagnosis not present

## 2014-06-19 DIAGNOSIS — M179 Osteoarthritis of knee, unspecified: Secondary | ICD-10-CM | POA: Diagnosis not present

## 2014-06-19 DIAGNOSIS — Z6831 Body mass index (BMI) 31.0-31.9, adult: Secondary | ICD-10-CM | POA: Diagnosis not present

## 2014-06-19 DIAGNOSIS — Z9181 History of falling: Secondary | ICD-10-CM | POA: Diagnosis not present

## 2014-06-19 DIAGNOSIS — E119 Type 2 diabetes mellitus without complications: Secondary | ICD-10-CM | POA: Diagnosis not present

## 2014-06-19 DIAGNOSIS — Z1389 Encounter for screening for other disorder: Secondary | ICD-10-CM | POA: Diagnosis not present

## 2014-06-23 ENCOUNTER — Other Ambulatory Visit: Payer: Self-pay | Admitting: Orthopedic Surgery

## 2014-06-23 ENCOUNTER — Encounter (HOSPITAL_COMMUNITY): Payer: Self-pay

## 2014-06-23 ENCOUNTER — Encounter (HOSPITAL_COMMUNITY)
Admission: RE | Admit: 2014-06-23 | Discharge: 2014-06-23 | Disposition: A | Discharge status: Home / Self Care | Payer: Medicare Other | Source: Ambulatory Visit | Attending: Orthopedic Surgery | Admitting: Orthopedic Surgery

## 2014-06-23 ENCOUNTER — Ambulatory Visit (HOSPITAL_COMMUNITY)
Admission: RE | Admit: 2014-06-23 | Discharge: 2014-06-23 | Disposition: A | Discharge status: Home / Self Care | Payer: Medicare Other | Source: Ambulatory Visit | Attending: Orthopedic Surgery | Admitting: Orthopedic Surgery

## 2014-06-23 DIAGNOSIS — Z01818 Encounter for other preprocedural examination: Secondary | ICD-10-CM

## 2014-06-23 DIAGNOSIS — M1712 Unilateral primary osteoarthritis, left knee: Secondary | ICD-10-CM

## 2014-06-23 HISTORY — DX: Type 2 diabetes mellitus without complications: E11.9

## 2014-06-23 HISTORY — DX: Gastro-esophageal reflux disease without esophagitis: K21.9

## 2014-06-23 HISTORY — DX: Unspecified osteoarthritis, unspecified site: M19.90

## 2014-06-23 HISTORY — DX: Atherosclerotic heart disease of native coronary artery without angina pectoris: I25.10

## 2014-06-23 HISTORY — DX: Unilateral primary osteoarthritis, left knee: M17.12

## 2014-06-23 HISTORY — DX: Inflammatory liver disease, unspecified: K75.9

## 2014-06-23 LAB — COMPREHENSIVE METABOLIC PANEL
ALBUMIN: 4.3 g/dL (ref 3.5–5.0)
ALK PHOS: 50 U/L (ref 38–126)
ALT: 19 U/L (ref 17–63)
ANION GAP: 10 (ref 5–15)
AST: 24 U/L (ref 15–41)
BILIRUBIN TOTAL: 0.4 mg/dL (ref 0.3–1.2)
BUN: 19 mg/dL (ref 6–20)
CO2: 25 mmol/L (ref 22–32)
Calcium: 9.4 mg/dL (ref 8.9–10.3)
Chloride: 103 mmol/L (ref 101–111)
Creatinine, Ser: 1.17 mg/dL (ref 0.61–1.24)
GFR calc Af Amer: >60 mL/min (ref >60)
Glucose, Bld: 108 mg/dL — ABNORMAL HIGH (ref 65–99)
Potassium: 3.8 mmol/L (ref 3.5–5.1)
SODIUM: 138 mmol/L (ref 135–145)
Total Protein: 7.3 g/dL (ref 6.5–8.1)

## 2014-06-23 LAB — URINALYSIS, ROUTINE W REFLEX MICROSCOPIC
BILIRUBIN URINE: NEGATIVE
GLUCOSE, UA: NEGATIVE mg/dL
HGB URINE DIPSTICK: NEGATIVE
Ketones, ur: NEGATIVE mg/dL
Leukocytes, UA: NEGATIVE
NITRITE: NEGATIVE
Protein, ur: NEGATIVE mg/dL
SPECIFIC GRAVITY, URINE: 1.01 (ref 1.005–1.030)
Urobilinogen, UA: 0.2 mg/dL (ref 0.0–1.0)
pH: 5.5 (ref 5.0–8.0)

## 2014-06-23 LAB — CBC WITH DIFFERENTIAL/PLATELET
BASOS ABS: 0 10*3/uL (ref 0.0–0.1)
BASOS PCT: 0 % (ref 0–1)
EOS ABS: 0.3 10*3/uL (ref 0.0–0.7)
Eosinophils Relative: 4 % (ref 0–5)
HCT: 41.3 % (ref 39.0–52.0)
Hemoglobin: 14.6 g/dL (ref 13.0–17.0)
Lymphocytes Relative: 29 % (ref 12–46)
Lymphs Abs: 2.6 10*3/uL (ref 0.7–4.0)
MCH: 29.9 pg (ref 26.0–34.0)
MCHC: 35.4 g/dL (ref 30.0–36.0)
MCV: 84.5 fL (ref 78.0–100.0)
Monocytes Absolute: 0.7 10*3/uL (ref 0.1–1.0)
Monocytes Relative: 8 % (ref 3–12)
NEUTROS PCT: 59 % (ref 43–77)
Neutro Abs: 5.3 10*3/uL (ref 1.7–7.7)
Platelets: 154 10*3/uL (ref 150–400)
RBC: 4.89 MIL/uL (ref 4.22–5.81)
RDW: 13.5 % (ref 11.5–15.5)
WBC: 8.9 10*3/uL (ref 4.0–10.5)

## 2014-06-23 LAB — PROTIME-INR
INR: 1.14 (ref 0.00–1.49)
Prothrombin Time: 14.7 seconds (ref 11.6–15.2)

## 2014-06-23 LAB — TYPE AND SCREEN
ABO/RH(D): O POS
Antibody Screen: NEGATIVE

## 2014-06-23 LAB — SURGICAL PCR SCREEN
MRSA, PCR: NEGATIVE
Staphylococcus aureus: POSITIVE — AB

## 2014-06-23 LAB — ABO/RH: ABO/RH(D): O POS

## 2014-06-23 LAB — APTT: APTT: 35 s (ref 24–37)

## 2014-06-23 NOTE — Pre-Procedure Instructions (Signed)
Christian Patton  06/23/2014   Your procedure is scheduled on:  May 23rd, Monday   Report to [facility] Admitting at 6:45 AM.  Call this number if you have problems the morning of surgery: [phone]   Remember:   Do not eat food or drink liquids after midnight Sunday.   Take these medicines the morning of surgery with A SIP OF WATER: Metoprolol, Protonix.             Do NOT take any diabetic medications the morning of surgery!   Do not wear jewelry - no rings or watches.  Do not wear lotions or colognes. You may NOT wear deodorant the day of surgery.  Men may shave face and neck.   Do not bring valuables to the hospital.  [facility] is not responsible for any belongings or valuables.               Contacts, dentures or bridgework may not be worn into surgery.  Leave suitcase in the car. After surgery it may be brought to your room.  For patients admitted to the hospital, discharge time is determined by your treatment team.    Name and phone number of your driver:    Special Instructions: "Preparing for Surgery" instruction sheet.   Please read over the following fact sheets that you were given: Pain Booklet, Coughing and Deep Breathing, Blood Transfusion Information, MRSA Information and Surgical Site Infection Prevention

## 2014-06-23 NOTE — Progress Notes (Signed)
I called a prescription for Mupirocin ointment to [facility], [address], [place], [place]

## 2014-06-23 NOTE — Progress Notes (Signed)
06/23/14 1529  OBSTRUCTIVE SLEEP APNEA  Have you ever been diagnosed with sleep apnea through a sleep study? No  Do you snore loudly (loud enough to be heard through closed doors)?  0  Do you often feel tired, fatigued, or sleepy during the daytime? 0  Has anyone observed you stop breathing during your sleep? 0  Do you have, or are you being treated for high blood pressure? 1  BMI more than 35 kg/m2? 0  Age over 74 years old? 1  Neck circumference greater than 40 cm/16 inches? 1  Gender: 1

## 2014-06-23 NOTE — Progress Notes (Addendum)
PCP is Dr. Nelda Bucks @ [facility] in [place].[phone]) Cardio is Dr. Shirlee More from [place]. [phone])  Went ahead and requested older EKG for comparison @ 1615. Mr. Gonyea has had 3 2-3 caths over at [facility] ( have requested that info).  The last cath he had to have stents placed.   A cardiac clearance note was supposedly faxed to Dr. Ruel Favors office--I requested that from their office. Patient states he's not had an echo or stress test.  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  Medical & Cardiac clearance notes inside chart.

## 2014-06-24 LAB — URINE CULTURE
COLONY COUNT: NO GROWTH
CULTURE: NO GROWTH

## 2014-06-30 MED ORDER — BUPIVACAINE LIPOSOME 1.3 % IJ SUSP
20.0000 mL | INTRAMUSCULAR | Status: DC
  Filled 2014-06-30: qty 20

## 2014-06-30 MED ORDER — TRANEXAMIC ACID 1000 MG/10ML IV SOLN
1000.0000 mg | INTRAVENOUS | Status: DC
  Filled 2014-06-30: qty 10

## 2014-07-02 MED ORDER — DEXTROSE 5 % IV SOLN
3.0000 g | INTRAVENOUS | Status: AC
  Administered 2014-07-03: 3 g via INTRAVENOUS
  Filled 2014-07-02: qty 3000

## 2014-07-03 ENCOUNTER — Encounter (HOSPITAL_COMMUNITY): Payer: Self-pay | Admitting: Certified Registered Nurse Anesthetist

## 2014-07-03 ENCOUNTER — Inpatient Hospital Stay (HOSPITAL_COMMUNITY): Payer: Medicare Other | Admitting: Certified Registered Nurse Anesthetist

## 2014-07-03 ENCOUNTER — Encounter (HOSPITAL_COMMUNITY)
Admission: RE | Disposition: A | Discharge status: Home Health | Payer: Self-pay | Source: Ambulatory Visit | Attending: Orthopedic Surgery

## 2014-07-03 ENCOUNTER — Inpatient Hospital Stay (HOSPITAL_COMMUNITY)
Admission: RE | Admit: 2014-07-03 | Discharge: 2014-07-04 | DRG: 470 | Disposition: A | Discharge status: Home Health | Payer: Medicare Other | Source: Ambulatory Visit | Attending: Orthopedic Surgery | Admitting: Orthopedic Surgery

## 2014-07-03 DIAGNOSIS — Z87891 Personal history of nicotine dependence: Secondary | ICD-10-CM | POA: Diagnosis not present

## 2014-07-03 DIAGNOSIS — I10 Essential (primary) hypertension: Secondary | ICD-10-CM | POA: Diagnosis present

## 2014-07-03 DIAGNOSIS — Z7982 Long term (current) use of aspirin: Secondary | ICD-10-CM | POA: Diagnosis not present

## 2014-07-03 DIAGNOSIS — K219 Gastro-esophageal reflux disease without esophagitis: Secondary | ICD-10-CM | POA: Diagnosis present

## 2014-07-03 DIAGNOSIS — M1712 Unilateral primary osteoarthritis, left knee: Secondary | ICD-10-CM | POA: Diagnosis not present

## 2014-07-03 DIAGNOSIS — Z96659 Presence of unspecified artificial knee joint: Secondary | ICD-10-CM

## 2014-07-03 DIAGNOSIS — Z79899 Other long term (current) drug therapy: Secondary | ICD-10-CM | POA: Diagnosis not present

## 2014-07-03 DIAGNOSIS — E119 Type 2 diabetes mellitus without complications: Secondary | ICD-10-CM | POA: Diagnosis not present

## 2014-07-03 DIAGNOSIS — I2510 Atherosclerotic heart disease of native coronary artery without angina pectoris: Secondary | ICD-10-CM | POA: Diagnosis present

## 2014-07-03 DIAGNOSIS — M179 Osteoarthritis of knee, unspecified: Secondary | ICD-10-CM | POA: Diagnosis not present

## 2014-07-03 DIAGNOSIS — G8918 Other acute postprocedural pain: Secondary | ICD-10-CM | POA: Diagnosis not present

## 2014-07-03 DIAGNOSIS — D62 Acute posthemorrhagic anemia: Secondary | ICD-10-CM | POA: Diagnosis not present

## 2014-07-03 DIAGNOSIS — M25562 Pain in left knee: Secondary | ICD-10-CM | POA: Diagnosis not present

## 2014-07-03 HISTORY — DX: Presence of unspecified artificial knee joint: Z96.659

## 2014-07-03 HISTORY — PX: TOTAL KNEE ARTHROPLASTY: SHX125

## 2014-07-03 LAB — GLUCOSE, CAPILLARY
GLUCOSE-CAPILLARY: 209 mg/dL — AB (ref 65–99)
GLUCOSE-CAPILLARY: 177 mg/dL — AB (ref 65–99)
Glucose-Capillary: 126 mg/dL — ABNORMAL HIGH (ref 65–99)
Glucose-Capillary: 201 mg/dL — ABNORMAL HIGH (ref 65–99)

## 2014-07-03 LAB — CBC
HCT: 40.9 % (ref 39.0–52.0)
Hemoglobin: 14.6 g/dL (ref 13.0–17.0)
MCH: 29.9 pg (ref 26.0–34.0)
MCHC: 35.7 g/dL (ref 30.0–36.0)
MCV: 83.6 fL (ref 78.0–100.0)
Platelets: 190 10*3/uL (ref 150–400)
RBC: 4.89 MIL/uL (ref 4.22–5.81)
RDW: 13.2 % (ref 11.5–15.5)
WBC: 15.5 10*3/uL — ABNORMAL HIGH (ref 4.0–10.5)

## 2014-07-03 LAB — CREATININE, SERUM
Creatinine, Ser: 1.21 mg/dL (ref 0.61–1.24)
GFR, EST NON AFRICAN AMERICAN: 58 mL/min — AB (ref >60)

## 2014-07-03 SURGERY — ARTHROPLASTY, KNEE, TOTAL
Anesthesia: Choice | Site: Knee | Laterality: Left

## 2014-07-03 MED ORDER — EPHEDRINE SULFATE 50 MG/ML IJ SOLN
INTRAMUSCULAR | Status: DC | PRN
  Administered 2014-07-03 (×4): 10 mg via INTRAVENOUS

## 2014-07-03 MED ORDER — GLYCOPYRROLATE 0.2 MG/ML IJ SOLN
INTRAMUSCULAR | Status: DC | PRN
  Administered 2014-07-03: 0.6 mg via INTRAVENOUS

## 2014-07-03 MED ORDER — FENTANYL CITRATE (PF) 100 MCG/2ML IJ SOLN
INTRAMUSCULAR | Status: AC
  Administered 2014-07-03: 100 ug
  Filled 2014-07-03: qty 2

## 2014-07-03 MED ORDER — ROCURONIUM BROMIDE 50 MG/5ML IV SOLN
INTRAVENOUS | Status: AC
  Filled 2014-07-03: qty 1

## 2014-07-03 MED ORDER — SODIUM CHLORIDE 0.9 % IV SOLN
INTRAVENOUS | Status: DC
  Administered 2014-07-03 – 2014-07-04 (×2): via INTRAVENOUS

## 2014-07-03 MED ORDER — HYDROCHLOROTHIAZIDE 25 MG PO TABS
25.0000 mg | ORAL_TABLET | Freq: Every day | ORAL | Status: DC
  Administered 2014-07-03 – 2014-07-04 (×2): 25 mg via ORAL
  Filled 2014-07-03 (×2): qty 1

## 2014-07-03 MED ORDER — 0.9 % SODIUM CHLORIDE (POUR BTL) OPTIME
TOPICAL | Status: DC | PRN
  Administered 2014-07-03: 1000 mL

## 2014-07-03 MED ORDER — OXYCODONE HCL ER 10 MG PO T12A
10.0000 mg | EXTENDED_RELEASE_TABLET | Freq: Two times a day (BID) | ORAL | Status: DC
  Administered 2014-07-03 – 2014-07-04 (×2): 10 mg via ORAL
  Filled 2014-07-03 (×2): qty 1

## 2014-07-03 MED ORDER — CHLORHEXIDINE GLUCONATE 4 % EX LIQD
60.0000 mL | Freq: Once | CUTANEOUS | Status: DC
  Filled 2014-07-03: qty 60

## 2014-07-03 MED ORDER — DOCUSATE SODIUM 100 MG PO CAPS
100.0000 mg | ORAL_CAPSULE | Freq: Two times a day (BID) | ORAL | Status: DC
  Administered 2014-07-03 – 2014-07-04 (×2): 100 mg via ORAL
  Filled 2014-07-03 (×2): qty 1

## 2014-07-03 MED ORDER — ONDANSETRON HCL 4 MG/2ML IJ SOLN
4.0000 mg | Freq: Four times a day (QID) | INTRAMUSCULAR | Status: DC | PRN
  Filled 2014-07-03: qty 2

## 2014-07-03 MED ORDER — BUPIVACAINE-EPINEPHRINE 0.5% -1:200000 IJ SOLN
INTRAMUSCULAR | Status: DC | PRN
  Administered 2014-07-03: 30 mL

## 2014-07-03 MED ORDER — ROCURONIUM BROMIDE 100 MG/10ML IV SOLN
INTRAVENOUS | Status: DC | PRN
  Administered 2014-07-03: 25 mg via INTRAVENOUS

## 2014-07-03 MED ORDER — ZOLPIDEM TARTRATE 5 MG PO TABS: 5.0000 mg | ORAL_TABLET | Freq: Every evening | ORAL | Status: DC | PRN

## 2014-07-03 MED ORDER — ONDANSETRON HCL 4 MG PO TABS
4.0000 mg | ORAL_TABLET | Freq: Four times a day (QID) | ORAL | Status: DC | PRN
  Filled 2014-07-03: qty 1

## 2014-07-03 MED ORDER — HYDROMORPHONE HCL 1 MG/ML IJ SOLN
1.0000 mg | INTRAMUSCULAR | Status: DC | PRN
  Administered 2014-07-03 – 2014-07-04 (×4): 1 mg via INTRAVENOUS
  Filled 2014-07-03 (×4): qty 1

## 2014-07-03 MED ORDER — METOCLOPRAMIDE HCL 5 MG PO TABS
5.0000 mg | ORAL_TABLET | Freq: Three times a day (TID) | ORAL | Status: DC | PRN
  Filled 2014-07-03: qty 2

## 2014-07-03 MED ORDER — PANTOPRAZOLE SODIUM 40 MG PO TBEC
40.0000 mg | DELAYED_RELEASE_TABLET | ORAL | Status: DC
  Administered 2014-07-03: 40 mg via ORAL
  Filled 2014-07-03: qty 1

## 2014-07-03 MED ORDER — SUCCINYLCHOLINE CHLORIDE 20 MG/ML IJ SOLN
INTRAMUSCULAR | Status: AC
  Filled 2014-07-03: qty 1

## 2014-07-03 MED ORDER — ALUM & MAG HYDROXIDE-SIMETH 200-200-20 MG/5ML PO SUSP: 30.0000 mL | ORAL | Status: DC | PRN

## 2014-07-03 MED ORDER — MENTHOL 3 MG MT LOZG
1.0000 | LOZENGE | OROMUCOSAL | Status: DC | PRN
  Filled 2014-07-03: qty 9

## 2014-07-03 MED ORDER — METOCLOPRAMIDE HCL 5 MG/ML IJ SOLN
5.0000 mg | Freq: Three times a day (TID) | INTRAMUSCULAR | Status: DC | PRN
  Filled 2014-07-03: qty 2

## 2014-07-03 MED ORDER — SUCCINYLCHOLINE CHLORIDE 20 MG/ML IJ SOLN
INTRAMUSCULAR | Status: DC | PRN
  Administered 2014-07-03: 100 mg via INTRAVENOUS

## 2014-07-03 MED ORDER — FENTANYL CITRATE (PF) 250 MCG/5ML IJ SOLN
INTRAMUSCULAR | Status: AC
  Filled 2014-07-03: qty 5

## 2014-07-03 MED ORDER — LACTATED RINGERS IV SOLN
INTRAVENOUS | Status: DC
  Administered 2014-07-03 (×2): via INTRAVENOUS

## 2014-07-03 MED ORDER — GLIPIZIDE ER 5 MG PO TB24
5.0000 mg | ORAL_TABLET | Freq: Every day | ORAL | Status: DC
  Administered 2014-07-04: 5 mg via ORAL
  Filled 2014-07-03 (×2): qty 1

## 2014-07-03 MED ORDER — DEXAMETHASONE SODIUM PHOSPHATE 4 MG/ML IJ SOLN
INTRAMUSCULAR | Status: DC | PRN
  Administered 2014-07-03: 4 mg via INTRAVENOUS

## 2014-07-03 MED ORDER — METHOCARBAMOL 1000 MG/10ML IJ SOLN
500.0000 mg | Freq: Four times a day (QID) | INTRAVENOUS | Status: DC | PRN
  Filled 2014-07-03: qty 5

## 2014-07-03 MED ORDER — PROPOFOL 10 MG/ML IV BOLUS
INTRAVENOUS | Status: DC | PRN
  Administered 2014-07-03: 160 mg via INTRAVENOUS

## 2014-07-03 MED ORDER — STERILE WATER FOR INJECTION IJ SOLN
INTRAMUSCULAR | Status: AC
  Filled 2014-07-03: qty 10

## 2014-07-03 MED ORDER — METOPROLOL SUCCINATE ER 25 MG PO TB24
25.0000 mg | ORAL_TABLET | Freq: Every day | ORAL | Status: DC
  Administered 2014-07-04: 25 mg via ORAL
  Filled 2014-07-03: qty 1

## 2014-07-03 MED ORDER — CELECOXIB 200 MG PO CAPS
200.0000 mg | ORAL_CAPSULE | Freq: Two times a day (BID) | ORAL | Status: DC
  Administered 2014-07-03 – 2014-07-04 (×2): 200 mg via ORAL
  Filled 2014-07-03 (×2): qty 1

## 2014-07-03 MED ORDER — OXYCODONE HCL 5 MG PO TABS
5.0000 mg | ORAL_TABLET | ORAL | Status: DC | PRN
  Administered 2014-07-03 – 2014-07-04 (×4): 10 mg via ORAL
  Filled 2014-07-03 (×4): qty 2

## 2014-07-03 MED ORDER — SODIUM CHLORIDE 0.9 % IV SOLN: INTRAVENOUS | Status: DC

## 2014-07-03 MED ORDER — INSULIN ASPART 100 UNIT/ML ~~LOC~~ SOLN
0.0000 [IU] | Freq: Three times a day (TID) | SUBCUTANEOUS | Status: DC
  Administered 2014-07-03: 5 [IU] via SUBCUTANEOUS

## 2014-07-03 MED ORDER — SENNOSIDES-DOCUSATE SODIUM 8.6-50 MG PO TABS: 1.0000 | ORAL_TABLET | Freq: Every evening | ORAL | Status: DC | PRN

## 2014-07-03 MED ORDER — NEOSTIGMINE METHYLSULFATE 10 MG/10ML IV SOLN
INTRAVENOUS | Status: DC | PRN
  Administered 2014-07-03: 5 mg via INTRAVENOUS

## 2014-07-03 MED ORDER — ONDANSETRON HCL 4 MG/2ML IJ SOLN
INTRAMUSCULAR | Status: AC
  Filled 2014-07-03: qty 2

## 2014-07-03 MED ORDER — VERAPAMIL HCL 120 MG PO TABS
120.0000 mg | ORAL_TABLET | Freq: Every day | ORAL | Status: DC
  Administered 2014-07-03 – 2014-07-04 (×2): 120 mg via ORAL
  Filled 2014-07-03 (×2): qty 1

## 2014-07-03 MED ORDER — LIDOCAINE HCL (CARDIAC) 20 MG/ML IV SOLN
INTRAVENOUS | Status: DC | PRN
  Administered 2014-07-03: 60 mg via INTRAVENOUS

## 2014-07-03 MED ORDER — BUPIVACAINE LIPOSOME 1.3 % IJ SUSP
INTRAMUSCULAR | Status: DC | PRN
  Administered 2014-07-03: 20 mL

## 2014-07-03 MED ORDER — TRANEXAMIC ACID 1000 MG/10ML IV SOLN: 1000.0000 mg | Freq: Once | INTRAVENOUS | Status: DC

## 2014-07-03 MED ORDER — PROPOFOL 10 MG/ML IV BOLUS
INTRAVENOUS | Status: AC
  Filled 2014-07-03: qty 20

## 2014-07-03 MED ORDER — QUETIAPINE FUMARATE 50 MG PO TABS
50.0000 mg | ORAL_TABLET | Freq: Every day | ORAL | Status: DC
  Administered 2014-07-03: 50 mg via ORAL
  Filled 2014-07-03 (×2): qty 1

## 2014-07-03 MED ORDER — DIPHENHYDRAMINE HCL 12.5 MG/5ML PO ELIX: 12.5000 mg | ORAL_SOLUTION | ORAL | Status: DC | PRN

## 2014-07-03 MED ORDER — ENOXAPARIN SODIUM 30 MG/0.3ML ~~LOC~~ SOLN
30.0000 mg | Freq: Two times a day (BID) | SUBCUTANEOUS | Status: DC
  Administered 2014-07-04: 30 mg via SUBCUTANEOUS
  Filled 2014-07-03: qty 0.3

## 2014-07-03 MED ORDER — FENTANYL CITRATE (PF) 100 MCG/2ML IJ SOLN
INTRAMUSCULAR | Status: DC | PRN
  Administered 2014-07-03: 50 ug via INTRAVENOUS

## 2014-07-03 MED ORDER — ONDANSETRON HCL 4 MG/2ML IJ SOLN
INTRAMUSCULAR | Status: DC | PRN
  Administered 2014-07-03: 4 mg via INTRAVENOUS

## 2014-07-03 MED ORDER — SODIUM CHLORIDE 0.9 % IR SOLN
Status: DC | PRN
  Administered 2014-07-03: 1000 mL

## 2014-07-03 MED ORDER — MIDAZOLAM HCL 2 MG/2ML IJ SOLN
INTRAMUSCULAR | Status: AC
  Filled 2014-07-03: qty 2

## 2014-07-03 MED ORDER — BISACODYL 5 MG PO TBEC: 5.0000 mg | DELAYED_RELEASE_TABLET | Freq: Every day | ORAL | Status: DC | PRN

## 2014-07-03 MED ORDER — EPHEDRINE SULFATE 50 MG/ML IJ SOLN
INTRAMUSCULAR | Status: AC
  Filled 2014-07-03: qty 1

## 2014-07-03 MED ORDER — ONDANSETRON HCL 4 MG/2ML IJ SOLN
4.0000 mg | Freq: Once | INTRAMUSCULAR | Status: AC | PRN
  Administered 2014-07-03: 4 mg via INTRAVENOUS

## 2014-07-03 MED ORDER — HYDROMORPHONE HCL 1 MG/ML IJ SOLN
INTRAMUSCULAR | Status: AC
  Filled 2014-07-03: qty 1

## 2014-07-03 MED ORDER — ROSUVASTATIN CALCIUM 5 MG PO TABS
5.0000 mg | ORAL_TABLET | Freq: Every day | ORAL | Status: DC
  Administered 2014-07-03 – 2014-07-04 (×2): 5 mg via ORAL
  Filled 2014-07-03 (×2): qty 1

## 2014-07-03 MED ORDER — PHENOL 1.4 % MT LIQD: 1.0000 | OROMUCOSAL | Status: DC | PRN

## 2014-07-03 MED ORDER — TRANEXAMIC ACID 1000 MG/10ML IV SOLN
2000.0000 mg | INTRAVENOUS | Status: DC | PRN
  Administered 2014-07-03: 2000 mg via TOPICAL

## 2014-07-03 MED ORDER — ACETAMINOPHEN 325 MG PO TABS: 650.0000 mg | ORAL_TABLET | Freq: Four times a day (QID) | ORAL | Status: DC | PRN

## 2014-07-03 MED ORDER — METHOCARBAMOL 500 MG PO TABS
500.0000 mg | ORAL_TABLET | Freq: Four times a day (QID) | ORAL | Status: DC | PRN
  Administered 2014-07-03 – 2014-07-04 (×2): 500 mg via ORAL
  Filled 2014-07-03 (×3): qty 1

## 2014-07-03 MED ORDER — FLEET ENEMA 7-19 GM/118ML RE ENEM: 1.0000 | ENEMA | Freq: Once | RECTAL | Status: AC | PRN

## 2014-07-03 MED ORDER — HYDROMORPHONE HCL 1 MG/ML IJ SOLN
0.5000 mg | INTRAMUSCULAR | Status: DC | PRN
  Administered 2014-07-03 (×2): 0.5 mg via INTRAVENOUS

## 2014-07-03 MED ORDER — ACETAMINOPHEN 650 MG RE SUPP: 650.0000 mg | Freq: Four times a day (QID) | RECTAL | Status: DC | PRN

## 2014-07-03 MED ORDER — TRANEXAMIC ACID 1000 MG/10ML IV SOLN
2000.0000 mg | Freq: Once | INTRAVENOUS | Status: DC
  Filled 2014-07-03 (×2): qty 20

## 2014-07-03 MED ORDER — CEFAZOLIN SODIUM 1-5 GM-% IV SOLN
1.0000 g | Freq: Four times a day (QID) | INTRAVENOUS | Status: AC
  Administered 2014-07-03 (×2): 1 g via INTRAVENOUS
  Filled 2014-07-03 (×2): qty 50

## 2014-07-03 SURGICAL SUPPLY — 58 items
BANDAGE ESMARK 6X9 LF (GAUZE/BANDAGES/DRESSINGS) ×1 IMPLANT
BLADE SAGITTAL 13X1.27X60 (BLADE) ×2 IMPLANT
BLADE SAGITTAL 13X1.27X60MM (BLADE) ×1
BLADE SAW SGTL 83.5X18.5 (BLADE) ×3 IMPLANT
BLADE SURG 10 STRL SS (BLADE) ×3 IMPLANT
BNDG ESMARK 6X9 LF (GAUZE/BANDAGES/DRESSINGS) ×3
BOWL SMART MIX CTS (DISPOSABLE) ×3 IMPLANT
CAPT KNEE TOTAL 3 ×3 IMPLANT
CEMENT BONE SIMPLEX SPEEDSET (Cement) ×6 IMPLANT
COVER SURGICAL LIGHT HANDLE (MISCELLANEOUS) ×3 IMPLANT
CUFF TOURNIQUET SINGLE 34IN LL (TOURNIQUET CUFF) ×3 IMPLANT
DRAPE EXTREMITY T 121X128X90 (DRAPE) ×3 IMPLANT
DRAPE IMP U-DRAPE 54X76 (DRAPES) ×3 IMPLANT
DRAPE INCISE IOBAN 66X45 STRL (DRAPES) ×6 IMPLANT
DRAPE PROXIMA HALF (DRAPES) IMPLANT
DRAPE U-SHAPE 47X51 STRL (DRAPES) ×3 IMPLANT
DRSG ADAPTIC 3X8 NADH LF (GAUZE/BANDAGES/DRESSINGS) ×3 IMPLANT
DRSG PAD ABDOMINAL 8X10 ST (GAUZE/BANDAGES/DRESSINGS) ×3 IMPLANT
DURAPREP 26ML APPLICATOR (WOUND CARE) ×6 IMPLANT
ELECT REM PT RETURN 9FT ADLT (ELECTROSURGICAL) ×3
ELECTRODE REM PT RTRN 9FT ADLT (ELECTROSURGICAL) ×1 IMPLANT
GAUZE SPONGE 4X4 12PLY STRL (GAUZE/BANDAGES/DRESSINGS) ×3 IMPLANT
GLOVE BIOGEL M 7.0 STRL (GLOVE) IMPLANT
GLOVE BIOGEL PI IND STRL 7.5 (GLOVE) IMPLANT
GLOVE BIOGEL PI IND STRL 8.5 (GLOVE) ×2 IMPLANT
GLOVE BIOGEL PI INDICATOR 7.5 (GLOVE)
GLOVE BIOGEL PI INDICATOR 8.5 (GLOVE) ×4
GLOVE SURG ORTHO 8.0 STRL STRW (GLOVE) ×6 IMPLANT
GOWN STRL REUS W/ TWL LRG LVL3 (GOWN DISPOSABLE) ×1 IMPLANT
GOWN STRL REUS W/ TWL XL LVL3 (GOWN DISPOSABLE) ×2 IMPLANT
GOWN STRL REUS W/TWL LRG LVL3 (GOWN DISPOSABLE) ×2
GOWN STRL REUS W/TWL XL LVL3 (GOWN DISPOSABLE) ×4
HANDPIECE INTERPULSE COAX TIP (DISPOSABLE) ×2
HOOD PEEL AWAY FACE SHEILD DIS (HOOD) ×9 IMPLANT
KIT BASIN OR (CUSTOM PROCEDURE TRAY) ×3 IMPLANT
KIT ROOM TURNOVER OR (KITS) ×3 IMPLANT
KNEE CAPITATED TOTAL 3 ×1 IMPLANT
MANIFOLD NEPTUNE II (INSTRUMENTS) ×3 IMPLANT
NEEDLE 22X1 1/2 (OR ONLY) (NEEDLE) ×6 IMPLANT
NS IRRIG 1000ML POUR BTL (IV SOLUTION) ×3 IMPLANT
PACK TOTAL JOINT (CUSTOM PROCEDURE TRAY) ×3 IMPLANT
PACK UNIVERSAL I (CUSTOM PROCEDURE TRAY) ×3 IMPLANT
PAD ARMBOARD 7.5X6 YLW CONV (MISCELLANEOUS) ×6 IMPLANT
PADDING CAST COTTON 6X4 STRL (CAST SUPPLIES) ×3 IMPLANT
SET HNDPC FAN SPRY TIP SCT (DISPOSABLE) ×1 IMPLANT
SPONGE GAUZE 4X4 12PLY STER LF (GAUZE/BANDAGES/DRESSINGS) ×3 IMPLANT
STAPLER VISISTAT 35W (STAPLE) ×3 IMPLANT
SUCTION FRAZIER TIP 10 FR DISP (SUCTIONS) ×3 IMPLANT
SUT BONE WAX W31G (SUTURE) ×3 IMPLANT
SUT VIC AB 0 CTB1 27 (SUTURE) ×6 IMPLANT
SUT VIC AB 1 CT1 27 (SUTURE) ×4
SUT VIC AB 1 CT1 27XBRD ANBCTR (SUTURE) ×2 IMPLANT
SUT VIC AB 2-0 CT1 27 (SUTURE) ×6
SUT VIC AB 2-0 CT1 TAPERPNT 27 (SUTURE) ×3 IMPLANT
SYR 20CC LL (SYRINGE) ×6 IMPLANT
TOWEL OR 17X24 6PK STRL BLUE (TOWEL DISPOSABLE) ×3 IMPLANT
TOWEL OR 17X26 10 PK STRL BLUE (TOWEL DISPOSABLE) ×3 IMPLANT
WATER STERILE IRR 1000ML POUR (IV SOLUTION) ×6 IMPLANT

## 2014-07-03 NOTE — Progress Notes (Signed)
Orthopedic Tech Progress Note Patient Details:  Christian Patton 1940/06/02 [id number]  CPM Left Knee CPM Left Knee: On Left Knee Flexion (Degrees): 90 Left Knee Extension (Degrees): 0 Additional Comments: trapeze bar patient helper Viewed order from doctor's order list  Hildred Priest 07/03/2014, 11:11 AM

## 2014-07-03 NOTE — H&P (Signed)
Christian Patton MRN:  [id number] DOB/SEX:  November 14, 1940/male  CHIEF COMPLAINT:  Painful left Knee  HISTORY: Patient is a 74 y.o. male presented with a history of pain in the left knee. Onset of symptoms was gradual starting several years ago with gradually worsening course since that time. Prior procedures on the knee include arthroscopy. Patient has been treated conservatively with over-the-counter NSAIDs and activity modification. Patient currently rates pain in the knee at 10 out of 10 with activity. There is pain at night.  PAST MEDICAL HISTORY: There are no active problems to display for this patient.  Past Medical History  Diagnosis Date  . Coronary artery disease   . Diabetes mellitus without complication   . 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 Hepatitis     C       "Has taken Harvoni"  . Arthritis    Past Surgical History  Procedure Laterality Date  . Cardiac catheterization      x 3    Last one was in 2011 @ [facility]  . Tonsillectomy       MEDICATIONS:   Prescriptions prior to admission  Medication Sig Dispense Refill Last Dose  . aspirin 81 MG chewable tablet Chew 81 mg by mouth daily.   Past Month at Unknown time  . CALCIUM PO Take 800 mg by mouth daily.   Past Week at Unknown time  . glipiZIDE (GLUCOTROL XL) 5 MG 24 hr tablet Take 5 mg by mouth daily as needed. If BS >120  0 07/02/2014 at Unknown time  . hydrochlorothiazide (HYDRODIURIL) 25 MG tablet Take 25 mg by mouth daily.  0 07/02/2014 at Unknown time  . MAGNESIUM GLYCINATE PLUS PO Take 1 tablet by mouth daily.     . metoprolol succinate (TOPROL-XL) 25 MG 24 hr tablet Take 25 mg by mouth daily.   07/03/2014 at Unknown time  . Multiple Vitamin (MULTIVITAMIN) tablet Take 1 tablet by mouth daily.   Past Week at Unknown time  . pantoprazole (PROTONIX) 40 MG tablet Take 40 mg by mouth 3 (three) times a week.   07/03/2014 at Unknown time  . QUEtiapine (SEROQUEL) 25 MG tablet Take 50  mg by mouth at bedtime.   07/02/2014 at Unknown time  . rosuvastatin (CRESTOR) 5 MG tablet Take 5 mg by mouth daily.   07/02/2014 at Unknown time  . verapamil (CALAN) 120 MG tablet Take 120 mg by mouth daily.   07/02/2014 at Unknown time  . zaleplon (SONATA) 10 MG capsule Take 10 mg by mouth at bedtime as needed for sleep.   07/02/2014 at Unknown time    ALLERGIES:  Not on File  REVIEW OF SYSTEMS:  A comprehensive review of systems was negative.   FAMILY HISTORY:  No family history on file.  SOCIAL HISTORY:   History  Substance Use Topics  . Smoking status: Former Smoker -- 1.00 packs/day for 50 years    Types: Cigarettes  . Smokeless tobacco: Not on file     Comment: quit 30 yrs  . Alcohol Use: No     Comment: quit 30 yrs ago     EXAMINATION:  Vital signs in last 24 hours:    General appearance: alert, cooperative and no distress Lungs: clear to auscultation bilaterally Heart: regular rate and rhythm, S1, S2 normal, no murmur, click, rub or gallop Abdomen: soft, non-tender; bowel sounds normal; no masses,  no organomegaly Extremities: extremities normal, atraumatic, no cyanosis or  edema and Homans sign is negative, no sign of DVT Pulses: 2+ and symmetric Skin: Skin color, texture, turgor normal. No rashes or lesions Neurologic: Alert and oriented X 3, normal strength and tone. Normal symmetric reflexes. Normal coordination and gait  Musculoskeletal:  ROM 0-100, Ligaments intact,  Imaging Review Plain radiographs demonstrate severe degenerative joint disease of the left knee. The overall alignment is significant varus. The bone quality appears to be good for age and reported activity level.  Assessment/Plan: Primary osteoarthritis, left knee   The patient history, physical examination and imaging studies are consistent with advanced degenerative joint disease of the left knee. The patient has failed conservative treatment.  The clearance notes were reviewed.  After  discussion with the patient it was felt that Total Knee Replacement was indicated. The procedure,  risks, and benefits of total knee arthroplasty were presented and reviewed. The risks including but not limited to aseptic loosening, infection, blood clots, vascular injury, stiffness, patella tracking problems complications among others were discussed. The patient acknowledged the explanation, agreed to proceed with the plan.  Christian Patton 07/03/2014, 6:56 AM

## 2014-07-03 NOTE — Anesthesia Postprocedure Evaluation (Signed)
  Anesthesia Post-op Note  Patient: Christian Patton  Procedure(s) Performed: Procedure(s): LEFT TOTAL KNEE ARTHOPLASTY (Left)  Patient Location: PACU  Anesthesia Type:Regional and Spinal  Level of Consciousness: awake, alert , oriented and patient cooperative  Airway and Oxygen Therapy: Patient Spontanous Breathing  Post-op Pain: none  Post-op Assessment: Post-op Vital signs reviewed, Patient's Cardiovascular Status Stable, Respiratory Function Stable, Patent Airway, No signs of Nausea or vomiting and Pain level controlled  Post-op Vital Signs: stable  Last Vitals:  Filed Vitals:   07/03/14 1216  BP: 170/87  Pulse:   Temp:   Resp:     Complications: No apparent anesthesia complications

## 2014-07-03 NOTE — Plan of Care (Signed)
Problem: Consults Goal: Diagnosis- Total Joint Replacement Primary Total Knee Left

## 2014-07-03 NOTE — Anesthesia Procedure Notes (Addendum)
Anesthesia Regional Block:  Adductor canal block  Pre-Anesthetic Checklist: ,, timeout performed, Correct Patient, Correct Site, Correct Laterality, Correct Procedure, Correct Position, site marked, Risks and benefits discussed,  Surgical consent,  Pre-op evaluation,  At surgeon's request and post-op pain management  Laterality: Left  Prep: Maximum Sterile Barrier Precautions used, chloraprep and alcohol swabs       Needles:  Injection technique: Single-shot  Needle Type: Stimulator Needle - 80          Additional Needles:  Procedures: Doppler guided Adductor canal block Narrative:  Start time: 07/03/2014 8:20 AM End time: 07/03/2014 8:30 AM Injection made incrementally with aspirations every 5 mL.  Performed by: Personally  Anesthesiologist: Kate Sable  Additional Notes: Pt accepts procedure w/ risks. 20cc 0.5% Marcane w/ epi w/o difficulty or discomfort  GES   Procedure Name: Intubation Date/Time: 07/03/2014 8:48 AM Performed by: Raphael Gibney T Pre-anesthesia Checklist: Patient identified, Emergency Drugs available, Suction available, Patient being monitored and Timeout performed Patient Re-evaluated:Patient Re-evaluated prior to inductionOxygen Delivery Method: Circle system utilized and Simple face mask Preoxygenation: Pre-oxygenation with 100% oxygen Intubation Type: IV induction Ventilation: Mask ventilation without difficulty Laryngoscope Size: Miller and 3 Grade View: Grade I Tube type: Oral Tube size: 7.5 mm Number of attempts: 1 Airway Equipment and Method: Patient positioned with wedge pillow and Stylet Placement Confirmation: ETT inserted through vocal cords under direct vision,  positive ETCO2 and breath sounds checked- equal and bilateral Secured at: 22 cm Tube secured with: Tape Dental Injury: Teeth and Oropharynx as per pre-operative assessment

## 2014-07-03 NOTE — Anesthesia Preprocedure Evaluation (Addendum)
Anesthesia Evaluation  Patient identified by MRN, date of birth, ID band Patient awake    Reviewed: Allergy & Precautions, NPO status , Patient's Chart, lab work & pertinent test results  Airway Mallampati: I       Dental  (+) Edentulous Upper, Edentulous Lower, Dental Advisory Given   Pulmonary former smoker,    Pulmonary exam normal       Cardiovascular + CAD and + Cardiac Stents Rhythm:Regular Rate:Normal     Neuro/Psych    GI/Hepatic (+) Hepatitis -  Endo/Other  diabetes, Type 2, Oral Hypoglycemic Agents  Renal/GU      Musculoskeletal  (+) Arthritis -,   Abdominal   Peds  Hematology   Anesthesia Other Findings   Reproductive/Obstetrics                            Anesthesia Physical Anesthesia Plan  ASA: III  Anesthesia Plan: General   Post-op Pain Management:    Induction: Intravenous  Airway Management Planned: LMA and Oral ETT  Additional Equipment:   Intra-op Plan:   Post-operative Plan: Extubation in OR  Informed Consent: I have reviewed the patients History and Physical, chart, labs and discussed the procedure including the risks, benefits and alternatives for the proposed anesthesia with the patient or authorized representative who has indicated his/her understanding and acceptance.     Plan Discussed with: CRNA, Anesthesiologist and Surgeon  Anesthesia Plan Comments:         Anesthesia Quick Evaluation

## 2014-07-03 NOTE — Progress Notes (Signed)
Dr. Tamala Julian notified of BP. Orders received to monitor pt for another 15-30 min to see if BP decreases w/o treatment and if not may give Labetalol IV PRN. Pt states pain is very tolerable.

## 2014-07-03 NOTE — Transfer of Care (Signed)
Immediate Anesthesia Transfer of Care Note  Patient: Christian Patton  Procedure(s) Performed: Procedure(s): LEFT TOTAL KNEE ARTHOPLASTY (Left)  Patient Location: PACU  Anesthesia Type:GA combined with regional for post-op pain  Level of Consciousness: lethargic and responds to stimulation  Airway & Oxygen Therapy: Patient Spontanous Breathing and Patient connected to nasal cannula oxygen  Post-op Assessment: Report given to RN, Post -op Vital signs reviewed and stable and Patient moving all extremities X 4  Post vital signs: stable  Last Vitals:  Filed Vitals:   07/03/14 0834  BP: 192/84  Pulse: 62  Temp:   Resp: 17    Complications: No apparent anesthesia complications

## 2014-07-03 NOTE — Plan of Care (Deleted)
Problem: Consults Goal: Diagnosis- Total Joint Replacement Primary Total Knee Left     

## 2014-07-04 ENCOUNTER — Encounter (HOSPITAL_COMMUNITY): Payer: Self-pay | Admitting: Orthopedic Surgery

## 2014-07-04 LAB — BASIC METABOLIC PANEL
ANION GAP: 8 (ref 5–15)
BUN: 14 mg/dL (ref 6–20)
CHLORIDE: 100 mmol/L — AB (ref 101–111)
CO2: 25 mmol/L (ref 22–32)
Calcium: 8.4 mg/dL — ABNORMAL LOW (ref 8.9–10.3)
Creatinine, Ser: 1.03 mg/dL (ref 0.61–1.24)
GFR calc Af Amer: >60 mL/min (ref >60)
GFR calc non Af Amer: >60 mL/min (ref >60)
Glucose, Bld: 163 mg/dL — ABNORMAL HIGH (ref 65–99)
POTASSIUM: 3.6 mmol/L (ref 3.5–5.1)
Sodium: 133 mmol/L — ABNORMAL LOW (ref 135–145)

## 2014-07-04 LAB — GLUCOSE, CAPILLARY
GLUCOSE-CAPILLARY: 168 mg/dL — AB (ref 65–99)
GLUCOSE-CAPILLARY: 174 mg/dL — AB (ref 65–99)
Glucose-Capillary: 167 mg/dL — ABNORMAL HIGH (ref 65–99)

## 2014-07-04 LAB — CBC
HCT: 32.2 % — ABNORMAL LOW (ref 39.0–52.0)
HEMOGLOBIN: 11.4 g/dL — AB (ref 13.0–17.0)
MCH: 29.5 pg (ref 26.0–34.0)
MCHC: 35.4 g/dL (ref 30.0–36.0)
MCV: 83.2 fL (ref 78.0–100.0)
PLATELETS: 150 10*3/uL (ref 150–400)
RBC: 3.87 MIL/uL — AB (ref 4.22–5.81)
RDW: 13.4 % (ref 11.5–15.5)
WBC: 14 10*3/uL — ABNORMAL HIGH (ref 4.0–10.5)

## 2014-07-04 LAB — HEMOGLOBIN A1C
Hgb A1c MFr Bld: 6.4 % — ABNORMAL HIGH (ref 4.8–5.6)
Mean Plasma Glucose: 137 mg/dL

## 2014-07-04 MED ORDER — ENOXAPARIN SODIUM 40 MG/0.4ML ~~LOC~~ SOLN: 40.0000 mg | SUBCUTANEOUS | Status: DC

## 2014-07-04 MED ORDER — METHOCARBAMOL 500 MG PO TABS: 500.0000 mg | ORAL_TABLET | Freq: Four times a day (QID) | ORAL | Status: DC | PRN

## 2014-07-04 MED ORDER — OXYCODONE HCL 5 MG PO TABS: 5.0000 mg | ORAL_TABLET | ORAL | Status: DC | PRN

## 2014-07-04 MED ORDER — OXYCODONE HCL ER 10 MG PO T12A: 10.0000 mg | EXTENDED_RELEASE_TABLET | Freq: Two times a day (BID) | ORAL | Status: DC

## 2014-07-04 NOTE — Progress Notes (Signed)
SPORTS MEDICINE AND JOINT REPLACEMENT  Lara Mulch, MD   Carlynn Spry, PA-C [address], [place], [place]  [address]                             [phone]   PROGRESS NOTE  Subjective:  negative for Chest Pain  negative for Shortness of Breath  negative for Nausea/Vomiting   negative for Calf Pain  negative for Bowel Movement   Tolerating Diet: yes         Patient reports pain as 5 on 0-10 scale.    Objective: Vital signs in last 24 hours:   Patient Vitals for the past 24 hrs:  BP Temp Temp src Pulse Resp SpO2  07/04/14 0500 135/65 mmHg 98.5 F (36.9 C) Oral 80 19 97 %  07/04/14 0022 (!) 121/58 mmHg 98 F (36.7 C) - 78 17 91 %  07/03/14 1943 139/75 mmHg 97.6 F (36.4 C) Oral 79 18 95 %  07/03/14 1413 (!) 163/79 mmHg 97.6 F (36.4 C) Oral 80 20 99 %  07/03/14 1322 - 97.8 F (36.6 C) - - - -  07/03/14 1316 (!) 170/86 mmHg - - 69 19 100 %  07/03/14 1301 (!) 171/79 mmHg - - 61 15 100 %  07/03/14 1246 (!) 168/77 mmHg - - (!) 59 15 100 %  07/03/14 1243 (!) 173/77 mmHg - - 66 17 99 %  07/03/14 1231 (!) 175/78 mmHg - - 62 16 100 %  07/03/14 1216 (!) 170/87 mmHg - - - - -  07/03/14 1212 (!) 181/86 mmHg - - 70 14 100 %    _0 {1959:LAST@   Intake/Output from previous day:   05/23 0701 - 05/24 0700 In: 2617.5 [P.O.:1280; I.V.:1337.5] Out: 1050 [Urine:1000]   Intake/Output this shift:   05/24 0701 - 05/24 1900 In: 480 [P.O.:480] Out: -    Intake/Output      05/23 0701 - 05/24 0700 05/24 0701 - 05/25 0700   P.O. 1280 480   I.V. 1337.5    Total Intake 2617.5 480   Urine 1000    Blood 50    Total Output 1050     Net +1567.5 +480           LABORATORY DATA:  Recent Labs  07/03/14 1635 07/04/14 0545  WBC 15.5* 14.0*  HGB 14.6 11.4*  HCT 40.9 32.2*  PLT 190 150    Recent Labs  07/03/14 1635 07/04/14 0545  NA  --  133*  K  --  3.6  CL  --  100*  CO2  --  25  BUN  --  14  CREATININE 1.21 1.03  GLUCOSE  --  163*  CALCIUM  --  8.4*    Lab Results  Component Value Date   INR 1.14 06/23/2014    Examination:  General appearance: alert, cooperative and no distress Extremities: Homans sign is negative, no sign of DVT  Wound Exam: clean, dry, intact   Drainage:  None: wound tissue dry  Motor Exam: EHL and FHL Intact  Sensory Exam: Deep Peroneal normal   Assessment:    1 Day Post-Op  Procedure(s) (LRB): LEFT TOTAL KNEE ARTHOPLASTY (Left)  ADDITIONAL DIAGNOSIS:  Active Problems:   S/P total knee arthroplasty  Acute Blood Loss Anemia   Plan: Physical Therapy as ordered Weight Bearing as Tolerated (WBAT)  DVT Prophylaxis:  Lovenox  DISCHARGE PLAN: Home  DISCHARGE NEEDS: HHPT, CPM, Walker and  3-in-1 comode seat         Briona Korpela 07/04/2014, 11:54 AM

## 2014-07-04 NOTE — Op Note (Signed)
TOTAL KNEE REPLACEMENT OPERATIVE NOTE:  07/03/2014  12:33 PM  PATIENT:  Christian Patton  74 y.o. male  PRE-OPERATIVE DIAGNOSIS:  Primary osteoarthritis left knee  POST-OPERATIVE DIAGNOSIS:  Primary osteoarthritis left knee  PROCEDURE:  Procedure(s): LEFT TOTAL KNEE ARTHOPLASTY  SURGEON:  Surgeon(s): Vickey Huger, MD  PHYSICIAN ASSISTANT: Carlynn Spry, [facility]  ANESTHESIA:   spinal  DRAINS: Hemovac  SPECIMEN: None  COUNTS:  Correct  TOURNIQUET:   Total Tourniquet Time Documented: Thigh (Left) - 47 minutes Total: Thigh (Left) - 47 minutes   DICTATION:  Indication for procedure:    The patient is a 74 y.o. male who has failed conservative treatment for Primary osteoarthritis left knee.  Informed consent was obtained prior to anesthesia. The risks versus benefits of the operation were explain and in a way the patient can, and did, understand.   On the implant demand matching protocol, this patient scored 10.  Therefore, this patient did" "did not receive a polyethylene insert with vitamin E which is a high demand implant.  Description of procedure:     The patient was taken to the operating room and placed under anesthesia.  The patient was positioned in the usual fashion taking care that all body parts were adequately padded and/or protected.  I foley catheter was not placed.  A tourniquet was applied and the leg prepped and draped in the usual sterile fashion.  The extremity was exsanguinated with the esmarch and tourniquet inflated to 350 mmHg.  Pre-operative range of motion was normal.  The knee was in 5 degree of mild varus.  A midline incision approximately 6-7 inches long was made with a #10 blade.  A new blade was used to make a parapatellar arthrotomy going 2-3 cm into the quadriceps tendon, over the patella, and alongside the medial aspect of the patellar tendon.  A synovectomy was then performed with the #10 blade and forceps. I then elevated the deep MCL off the medial  tibial metaphysis subperiosteally around to the semimembranosus attachment.    I everted the patella and used calipers to measure patellar thickness.  I used the reamer to ream down to appropriate thickness to recreate the native thickness.  I then removed excess bone with the rongeur and sagittal saw.  I used the appropriately sized template and drilled the three lug holes.  I then put the trial in place and measured the thickness with the calipers to ensure recreation of the native thickness.  The trial was then removed and the patella subluxed and the knee brought into flexion.  A homan retractor was place to retract and protect the patella and lateral structures.  A Z-retractor was place medially to protect the medial structures.  The extra-medullary alignment system was used to make cut the tibial articular surface perpendicular to the anamotic axis of the tibia and in 3 degrees of posterior slope.  The cut surface and alignment jig was removed.  I then used the intramedullary alignment guide to make a 6 valgus cut on the distal femur.  I then marked out the epicondylar axis on the distal femur.  The posterior condylar axis measured 3 degrees.  I then used the anterior referencing sizer and measured the femur to be a size 9.  The 4-In-1 cutting block was screwed into place in external rotation matching the posterior condylar angle, making our cuts perpendicular to the epicondylar axis.  Anterior, posterior and chamfer cuts were made with the sagittal saw.  The cutting block and cut  pieces were removed.  A lamina spreader was placed in 90 degrees of flexion.  The ACL, PCL, menisci, and posterior condylar osteophytes were removed.  A 10 mm spacer blocked was found to offer good flexion and extension gap balance after mild in degree releasing.   The scoop retractor was then placed and the femoral finishing block was pinned in place.  The small sagittal saw was used as well as the lug drill to finish the  femur.  The block and cut surfaces were removed and the medullary canal hole filled with autograft bone from the cut pieces.  The tibia was delivered forward in deep flexion and external rotation.  A size E tray was selected and pinned into place centered on the medial 1/3 of the tibial tubercle.  The reamer and keel was used to prepare the tibia through the tray.    I then trialed with the size 9 femur, size E tibia, a 10 mm insert and the 32 patella.  I had excellent flexion/extension gap balance, excellent patella tracking.  Flexion was full and beyond 120 degrees; extension was zero.  These components were chosen and the staff opened them to me on the back table while the knee was lavaged copiously and the cement mixed.  The soft tissue was infiltrated with 60cc of exparel 1.3% through a 21 gauge needle.  I cemented in the components and removed all excess cement.  The polyethylene tibial component was snapped into place and the knee placed in extension while cement was hardening.  The capsule was infilltrated with 30cc of .25% Marcaine with epinephrine.  A hemovac was place in the joint exiting superolaterally.  A pain pump was place superomedially superficial to the arthrotomy.  Once the cement was hard, the tourniquet was let down.  Hemostasis was obtained.  The arthrotomy was closed with figure-8 #1 vicryl sutures.  The deep soft tissues were closed with #0 vicryls and the subcuticular layer closed with a running #2-0 vicryl.  The skin was reapproximated and closed with skin staples.  The wound was dressed with xeroform, 4 x4's, 2 ABD sponges, a single layer of webril and a TED stocking.   The patient was then awakened, extubated, and taken to the recovery room in stable condition.  BLOOD LOSS:  300cc DRAINS: 1 hemovac, 1 pain catheter COMPLICATIONS:  None.  PLAN OF CARE: Admit to inpatient   PATIENT DISPOSITION:  PACU - hemodynamically stable.   Delay start of Pharmacological VTE agent  (>24hrs) due to surgical blood loss or risk of bleeding:  not applicable  Please fax a copy of this op note to my office at [phone] (please only include page 1 and 2 of the Case Information op note)

## 2014-07-04 NOTE — Evaluation (Signed)
Physical Therapy Evaluation Patient Details Name: Christian Patton MRN: [id number] DOB: 19-Mar-1940 Today's Date: 07/04/2014   History of Present Illness  Patient is a 74 y/o male s/p L TKA. PMH of CAD, DM, Hepatitis and arthritis.  Clinical Impression  Patient presents with post surgical deficits LLE s/p above surgery and pain impacting mobility. Pt with left foot drop putting pt at increased risk for falls. Pt very active and independent PTA and will have 24/7 S from wife at home. Would benefit from skilled PT to improve transfers, gait, balance and mobility so pt can maximize independence and return to PLOF.     Follow Up Recommendations Home health PT;Supervision/Assistance - 24 hour    Equipment Recommendations  None recommended by PT    Recommendations for Other Services       Precautions / Restrictions Precautions Precautions: Knee Precaution Booklet Issued: Yes (comment) Precaution Comments: Reviewed no pillow under knee and HEP. Restrictions Weight Bearing Restrictions: Yes LLE Weight Bearing: Weight bearing as tolerated      Mobility  Bed Mobility Overal bed mobility: Modified Independent             General bed mobility comments: HOB elevated, use of rails.   Transfers Overall transfer level: Needs assistance Equipment used: Rolling walker (2 wheeled) Transfers: Sit to/from Stand Sit to Stand: Min guard         General transfer comment: Min guard to stand from EOB.   Ambulation/Gait Ambulation/Gait assistance: Min guard Ambulation Distance (Feet): 60 Feet Assistive device: Rolling walker (2 wheeled) Gait Pattern/deviations: Step-to pattern;Decreased stance time - left;Decreased step length - right;Decreased dorsiflexion - left;Trunk flexed;Steppage;Decreased stride length   Gait velocity interpretation: Below normal speed for age/gender General Gait Details: Steppage gait due to decreased DF LLE during swing phase. Dyspnea present. Not able to obtain  Sa02.   Stairs            Wheelchair Mobility    Modified Rankin (Stroke Patients Only)       Balance Overall balance assessment: Needs assistance Sitting-balance support: Feet supported;No upper extremity supported Sitting balance-Leahy Scale: Good     Standing balance support: During functional activity Standing balance-Leahy Scale: Fair                               Pertinent Vitals/Pain Pain Assessment: Faces Faces Pain Scale: Hurts a little bit Pain Location: left knee Pain Descriptors / Indicators: Sore;Aching Pain Intervention(s): Limited activity within patient's tolerance;Repositioned;Premedicated before session;Monitored during session    [place] expects to be discharged to:: Private residence Living Arrangements: Spouse/significant other Available Help at Discharge: Family;Available 24 hours/day Type of Home: House Home Access: Stairs to enter Entrance Stairs-Rails: Right Entrance Stairs-Number of Steps: 3 Home Layout: One level Home Equipment: Walker - 2 wheels;Cane - single point;Bedside commode      Prior Function Level of Independence: Independent         Comments: Pt used SPC PRN. 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     Hand Dominance        Extremity/Trunk Assessment   Upper Extremity Assessment: Defer to OT evaluation           Lower Extremity Assessment: LLE deficits/detail   LLE Deficits / Details: Limited AROM hip/knee due to surgery. 0/5 DF.     Communication   Communication: No difficulties  Cognition Arousal/Alertness: Awake/alert Behavior During Therapy: WFL for tasks assessed/performed Overall Cognitive Status: Within  Functional Limits for tasks assessed                      General Comments      Exercises Total Joint Exercises Ankle Circles/Pumps: Both;15 reps;Supine Quad Sets: Left;10 reps;Supine Heel Slides: Left;5 reps;Seated Goniometric ROM: -10-78 degrees knee AROM.       Assessment/Plan    PT Assessment Patient needs continued PT services  PT Diagnosis Difficulty walking;Abnormality of gait   PT Problem List Decreased strength;Cardiopulmonary status limiting activity;Decreased range of motion;Decreased activity tolerance;Decreased balance;Decreased mobility  PT Treatment Interventions Balance training;Gait training;Stair training;Functional mobility training;Therapeutic activities;Therapeutic exercise;Patient/family education   PT Goals (Current goals can be found in the Care Plan section) Acute Rehab PT Goals Patient Stated Goal: to go home later today PT Goal Formulation: With patient Time For Goal Achievement: 07/18/14 Potential to Achieve Goals: Good    Frequency 7X/week   Barriers to discharge        Co-evaluation               End of Session Equipment Utilized During Treatment: Gait belt Activity Tolerance: Patient tolerated treatment well Patient left: in chair;with call bell/phone within reach (with zero degress knee foam under ankle.) Nurse Communication: Mobility status         Time: 7900-9200 PT Time Calculation (min) (ACUTE ONLY): 23 min   Charges:   PT Evaluation $Initial PT Evaluation Tier I: 1 Procedure PT Treatments $Therapeutic Activity: 8-22 mins   PT G Codes:        Toribio Seiber A Orvile Corona 07/04/2014, 10:22 AM Wray Kearns, [place], DPT [phone]

## 2014-07-04 NOTE — Discharge Instructions (Signed)
INSTRUCTIONS AFTER JOINT REPLACEMENT   o Remove items at home which could result in a fall. This includes throw rugs or furniture in walking pathways o ICE to the affected joint every three hours while awake for 30 minutes at a time, for at least the first 3-5 days, and then as needed for pain and swelling.  Continue to use ice for pain and swelling. You may notice swelling that will progress down to the foot and ankle.  This is normal after surgery.  Elevate your leg when you are not up walking on it.   o Continue to use the breathing machine you got in the hospital (incentive spirometer) which will help keep your temperature down.  It is common for your temperature to cycle up and down following surgery, especially at night when you are not up moving around and exerting yourself.  The breathing machine keeps your lungs expanded and your temperature down.   DIET:  As you were doing prior to hospitalization, we recommend a well-balanced diet.  DRESSING / WOUND CARE / SHOWERING  May change dressing wednesday  ACTIVITY  o Increase activity slowly as tolerated, but follow the weight bearing instructions below.   o No driving for 6 weeks or until further direction given by your physician.  You cannot drive while taking narcotics.  o No lifting or carrying greater than 10 lbs. until further directed by your surgeon. o Avoid periods of inactivity such as sitting longer than an hour when not asleep. This helps prevent blood clots.  o You may return to work once you are authorized by your doctor.     WEIGHT BEARING   Weight bearing as tolerated with assist device (walker, cane, etc) as directed, use it as long as suggested by your surgeon or therapist, typically at least 4-6 weeks.   EXERCISES  Results after joint replacement surgery are often greatly improved when you follow the exercise, range of motion and muscle strengthening exercises prescribed by your doctor. Safety measures are also  important to protect the joint from further injury. Any time any of these exercises cause you to have increased pain or swelling, decrease what you are doing until you are comfortable again and then slowly increase them. If you have problems or questions, call your caregiver or physical therapist for advice.   Rehabilitation is important following a joint replacement. After just a few days of immobilization, the muscles of the leg can become weakened and shrink (atrophy).  These exercises are designed to build up the tone and strength of the thigh and leg muscles and to improve motion. Often times heat used for twenty to thirty minutes before working out will loosen up your tissues and help with improving the range of motion but do not use heat for the first two weeks following surgery (sometimes heat can increase post-operative swelling).   These exercises can be done on a training (exercise) mat, on the floor, on a table or on a bed. Use whatever works the best and is most comfortable for you.    Use music or television while you are exercising so that the exercises are a pleasant break in your day. This will make your life better with the exercises acting as a break in your routine that you can look forward to.   Perform all exercises about fifteen times, three times per day or as directed.  You should exercise both the operative leg and the other leg as well.  Exercises include:  Quad Sets - Tighten up the muscle on the front of the thigh (Quad) and hold for 5-10 seconds.    Straight Leg Raises - With your knee straight (if you were given a brace, keep it on), lift the leg to 60 degrees, hold for 3 seconds, and slowly lower the leg.  Perform this exercise against resistance later as your leg gets stronger.   Leg Slides: Lying on your back, slowly slide your foot toward your buttocks, bending your knee up off the floor (only go as far as is comfortable). Then slowly slide your foot back down until  your leg is flat on the floor again.   Angel Wings: Lying on your back spread your legs to the side as far apart as you can without causing discomfort.   Hamstring Strength:  Lying on your back, push your heel against the floor with your leg straight by tightening up the muscles of your buttocks.  Repeat, but this time bend your knee to a comfortable angle, and push your heel against the floor.  You may put a pillow under the heel to make it more comfortable if necessary.   A rehabilitation program following joint replacement surgery can speed recovery and prevent re-injury in the future due to weakened muscles. Contact your doctor or a physical therapist for more information on knee rehabilitation.    CONSTIPATION  Constipation is defined medically as fewer than three stools per week and severe constipation as less than one stool per week.  Even if you have a regular bowel pattern at home, your normal regimen is likely to be disrupted due to multiple reasons following surgery.  Combination of anesthesia, postoperative narcotics, change in appetite and fluid intake all can affect your bowels.   YOU MUST use at least one of the following options; they are listed in order of increasing strength to get the job done.  They are all available over the counter, and you may need to use some, POSSIBLY even all of these options:    Drink plenty of fluids (prune juice may be helpful) and high fiber foods Colace 100 mg by mouth twice a day  Senokot for constipation as directed and as needed Dulcolax (bisacodyl), take with full glass of water  Miralax (polyethylene glycol) once or twice a day as needed.  If you have tried all these things and are unable to have a bowel movement in the first 3-4 days after surgery call either your surgeon or your primary doctor.    If you experience loose stools or diarrhea, hold the medications until you stool forms back up.  If your symptoms do not get better within 1 week  or if they get worse, check with your doctor.  If you experience "the worst abdominal pain ever" or develop nausea or vomiting, please contact the office immediately for further recommendations for treatment.   ITCHING:  If you experience itching with your medications, try taking only a single pain pill, or even half a pain pill at a time.  You can also use Benadryl over the counter for itching or also to help with sleep.   TED HOSE STOCKINGS:  Use stockings on both legs until for at least 2 weeks or as directed by physician office. They may be removed at night for sleeping.  MEDICATIONS:  See your medication summary on the After Visit Summary that nursing will review with you.  You may have some home medications which will be placed on hold until  you complete the course of blood thinner medication.  It is important for you to complete the blood thinner medication as prescribed.  PRECAUTIONS:  If you experience chest pain or shortness of breath - call 911 immediately for transfer to the hospital emergency department.   If you develop a fever greater that 101 F, purulent drainage from wound, increased redness or drainage from wound, foul odor from the wound/dressing, or calf pain - CONTACT YOUR SURGEON.                                                   FOLLOW-UP APPOINTMENTS:  If you do not already have a post-op appointment, please call the office for an appointment to be seen by your surgeon.  Guidelines for how soon to be seen are listed in your After Visit Summary, but are typically between 1-4 weeks after surgery.  OTHER INSTRUCTIONS:   Knee Replacement:  Do not place pillow under knee, focus on keeping the knee straight while resting. CPM instructions: 0-90 degrees, 2 hours in the morning, 2 hours in the afternoon, and 2 hours in the evening. Place foam block, curve side up under heel at all times except when in CPM or when walking.  DO NOT modify, tear, cut, or change the foam block in any  way.  MAKE SURE YOU:   Understand these instructions.   Get help right away if you are not doing well or get worse.    Thank you for letting us be a part of your medical care team.  It is a privilege we respect greatly.  We hope these instructions will help you stay on track for a fast and full recovery!

## 2014-07-04 NOTE — Care Management Note (Signed)
Case Management Note  Patient Details  Name: Christian Patton MRN: [id number] Date of Birth: 02-May-1940  Subjective/Objective:  Patient admitted with left knee arthritis. Patient underwent a left total knee arthroplasty.                   Action/Plan: Patient was preoperatively setup with [facility], no changes.   Expected Discharge Date: 07/04/14                  Expected Discharge Plan:Home with [facility]     In-House Referral:  NA  Discharge planning Services  CM Consult  Post Acute Care Choice:  Durable Medical Equipment, Home Health Choice offered to:  Patient  DME Arranged:  3-N-1, CPM DME Agency:  [employer]  HH Arranged:  PT HH Agency:  [facility]  Status of Service:  Completed, signed off  Medicare Important Message Given:    Date Medicare IM Given:    Medicare IM give by:    Date Additional Medicare IM Given:    Additional Medicare Important Message give by:     If discussed at [place] of Stay Meetings, dates discussed:    Additional Comments:  Ninfa Meeker, RN 07/04/2014, 4:12 PM

## 2014-07-04 NOTE — Progress Notes (Signed)
Physical Therapy Treatment Patient Details Name: Christian Patton MRN: [id number] DOB: 05-24-1940 Today's Date: 07/04/2014    History of Present Illness Patient is a 74 y/o male s/p L TKA. PMH of CAD, DM, Hepatitis and arthritis.    PT Comments    Patient progressing well towards PT goals. Performed stair training with spouse assist to prepare pt for discharge home. Reviewed HEP with wife and pt and demonstrated exercises. Education provided on pursed lip breathing to improve ventilation during mobility/activity. Pt safe to discharge from a mobility standpoint. Will continue to follow if still in hospital tomorrow to maximize independence.   Follow Up Recommendations  Home health PT;Supervision/Assistance - 24 hour     Equipment Recommendations  None recommended by PT    Recommendations for Other Services       Precautions / Restrictions Precautions Precautions: Knee Precaution Booklet Issued: Yes (comment) Precaution Comments: Reviewed no pillow under knee and HEP. Restrictions Weight Bearing Restrictions: Yes LLE Weight Bearing: Weight bearing as tolerated    Mobility  Bed Mobility Overal bed mobility: Modified Independent;Needs Assistance Bed Mobility: Sit to Supine       Sit to supine: Modified independent (Device/Increase time)   General bed mobility comments: HOB flat, no use of rails.  Transfers Overall transfer level: Needs assistance Equipment used: Rolling walker (2 wheeled) Transfers: Sit to/from Stand Sit to Stand: Supervision         General transfer comment: Supervision to rise from chair x4.  Ambulation/Gait Ambulation/Gait assistance: Min guard Ambulation Distance (Feet): 150 Feet Assistive device: Rolling walker (2 wheeled) Gait Pattern/deviations: Step-to pattern;Decreased stance time - left;Decreased step length - right;Decreased dorsiflexion - left;Trunk flexed   Gait velocity interpretation: Below normal speed for age/gender General Gait  Details: Steppage gait due to decreased DF LLE during swing phase. Sa02 dropped to 87% but quickly resolved with cues for pursed lip breathing. Ambulated to/from bathroom at beginning and end of session.    Stairs Stairs: Yes Stairs assistance: Min assist Stair Management: One rail Right;Forwards;Two rails Number of Stairs: 2 (x2 bouts) General stair comments: First bout, using 2 HRs for support. ON second bout, used wife for hand rail on RUE. Cues for technique.   Wheelchair Mobility    Modified Rankin (Stroke Patients Only)       Balance Overall balance assessment: Needs assistance Sitting-balance support: Feet supported;No upper extremity supported Sitting balance-Leahy Scale: Good     Standing balance support: During functional activity Standing balance-Leahy Scale: Fair Standing balance comment: Able to perform hand washing at sink x2 without UE support for short periods.                     Cognition Arousal/Alertness: Awake/alert Behavior During Therapy: WFL for tasks assessed/performed Overall Cognitive Status: Within Functional Limits for tasks assessed                      Exercises Total Joint Exercises Ankle Circles/Pumps: Both;15 reps;Supine Quad Sets: Left;10 reps;Supine Towel Squeeze: Both;5 reps;Supine    General Comments General comments (skin integrity, edema, etc.): pt with some urinary retention since surgery. Some drop in Sa02 noted initially during PT session however improved to high 90s towards end.      Pertinent Vitals/Pain Pain Assessment: Faces Faces Pain Scale: Hurts a little bit Pain Location: left knee Pain Descriptors / Indicators: Sore Pain Intervention(s): Monitored during session;Repositioned    Home Living  Prior Function            PT Goals (current goals can now be found in the care plan section) Progress towards PT goals: Progressing toward goals    Frequency  7X/week     PT Plan Current plan remains appropriate    Co-evaluation             End of Session Equipment Utilized During Treatment: Gait belt Activity Tolerance: Patient tolerated treatment well Patient left: in bed;with call bell/phone within reach;with family/visitor present     Time: 0370-9643 PT Time Calculation (min) (ACUTE ONLY): 53 min  Charges:  $Gait Training: 23-37 mins $Therapeutic Exercise: 8-22 mins                    G Codes:      Casee Knepp A Sharifa Bucholz 07/04/2014, 3:34 PM  Wray Kearns, [place], DPT [phone]

## 2014-07-05 DIAGNOSIS — I2510 Atherosclerotic heart disease of native coronary artery without angina pectoris: Secondary | ICD-10-CM | POA: Diagnosis not present

## 2014-07-05 DIAGNOSIS — Z96652 Presence of left artificial knee joint: Secondary | ICD-10-CM | POA: Diagnosis not present

## 2014-07-05 DIAGNOSIS — Z471 Aftercare following joint replacement surgery: Secondary | ICD-10-CM | POA: Diagnosis not present

## 2014-07-05 DIAGNOSIS — Z87891 Personal history of nicotine dependence: Secondary | ICD-10-CM | POA: Diagnosis not present

## 2014-07-05 DIAGNOSIS — Z7901 Long term (current) use of anticoagulants: Secondary | ICD-10-CM | POA: Diagnosis not present

## 2014-07-05 DIAGNOSIS — E119 Type 2 diabetes mellitus without complications: Secondary | ICD-10-CM | POA: Diagnosis not present

## 2014-07-07 DIAGNOSIS — Z96652 Presence of left artificial knee joint: Secondary | ICD-10-CM | POA: Diagnosis not present

## 2014-07-07 DIAGNOSIS — Z87891 Personal history of nicotine dependence: Secondary | ICD-10-CM | POA: Diagnosis not present

## 2014-07-07 DIAGNOSIS — E119 Type 2 diabetes mellitus without complications: Secondary | ICD-10-CM | POA: Diagnosis not present

## 2014-07-07 DIAGNOSIS — I2510 Atherosclerotic heart disease of native coronary artery without angina pectoris: Secondary | ICD-10-CM | POA: Diagnosis not present

## 2014-07-07 DIAGNOSIS — Z471 Aftercare following joint replacement surgery: Secondary | ICD-10-CM | POA: Diagnosis not present

## 2014-07-07 DIAGNOSIS — Z7901 Long term (current) use of anticoagulants: Secondary | ICD-10-CM | POA: Diagnosis not present

## 2014-07-08 DIAGNOSIS — I2510 Atherosclerotic heart disease of native coronary artery without angina pectoris: Secondary | ICD-10-CM | POA: Diagnosis not present

## 2014-07-08 DIAGNOSIS — E119 Type 2 diabetes mellitus without complications: Secondary | ICD-10-CM | POA: Diagnosis not present

## 2014-07-08 DIAGNOSIS — Z87891 Personal history of nicotine dependence: Secondary | ICD-10-CM | POA: Diagnosis not present

## 2014-07-08 DIAGNOSIS — Z471 Aftercare following joint replacement surgery: Secondary | ICD-10-CM | POA: Diagnosis not present

## 2014-07-08 DIAGNOSIS — Z7901 Long term (current) use of anticoagulants: Secondary | ICD-10-CM | POA: Diagnosis not present

## 2014-07-08 DIAGNOSIS — Z96652 Presence of left artificial knee joint: Secondary | ICD-10-CM | POA: Diagnosis not present

## 2014-07-10 DIAGNOSIS — Z7901 Long term (current) use of anticoagulants: Secondary | ICD-10-CM | POA: Diagnosis not present

## 2014-07-10 DIAGNOSIS — I2510 Atherosclerotic heart disease of native coronary artery without angina pectoris: Secondary | ICD-10-CM | POA: Diagnosis not present

## 2014-07-10 DIAGNOSIS — E119 Type 2 diabetes mellitus without complications: Secondary | ICD-10-CM | POA: Diagnosis not present

## 2014-07-10 DIAGNOSIS — Z87891 Personal history of nicotine dependence: Secondary | ICD-10-CM | POA: Diagnosis not present

## 2014-07-10 DIAGNOSIS — Z96652 Presence of left artificial knee joint: Secondary | ICD-10-CM | POA: Diagnosis not present

## 2014-07-10 DIAGNOSIS — Z471 Aftercare following joint replacement surgery: Secondary | ICD-10-CM | POA: Diagnosis not present

## 2014-07-12 ENCOUNTER — Encounter (HOSPITAL_COMMUNITY): Payer: Self-pay | Admitting: Emergency Medicine

## 2014-07-12 ENCOUNTER — Inpatient Hospital Stay (HOSPITAL_COMMUNITY): Payer: Medicare Other

## 2014-07-12 ENCOUNTER — Emergency Department (HOSPITAL_COMMUNITY): Payer: Medicare Other

## 2014-07-12 ENCOUNTER — Inpatient Hospital Stay (HOSPITAL_COMMUNITY)
Admission: EM | Admit: 2014-07-12 | Discharge: 2014-07-14 | DRG: 176 | Disposition: A | Discharge status: Home / Self Care | Payer: Medicare Other | Attending: Internal Medicine | Admitting: Internal Medicine

## 2014-07-12 DIAGNOSIS — R0902 Hypoxemia: Secondary | ICD-10-CM | POA: Diagnosis present

## 2014-07-12 DIAGNOSIS — R072 Precordial pain: Secondary | ICD-10-CM | POA: Diagnosis not present

## 2014-07-12 DIAGNOSIS — R0602 Shortness of breath: Secondary | ICD-10-CM | POA: Diagnosis not present

## 2014-07-12 DIAGNOSIS — Z87891 Personal history of nicotine dependence: Secondary | ICD-10-CM

## 2014-07-12 DIAGNOSIS — R339 Retention of urine, unspecified: Secondary | ICD-10-CM | POA: Diagnosis not present

## 2014-07-12 DIAGNOSIS — Z96653 Presence of artificial knee joint, bilateral: Secondary | ICD-10-CM | POA: Diagnosis present

## 2014-07-12 DIAGNOSIS — I2510 Atherosclerotic heart disease of native coronary artery without angina pectoris: Secondary | ICD-10-CM | POA: Diagnosis present

## 2014-07-12 DIAGNOSIS — Z7902 Long term (current) use of antithrombotics/antiplatelets: Secondary | ICD-10-CM | POA: Diagnosis not present

## 2014-07-12 DIAGNOSIS — E119 Type 2 diabetes mellitus without complications: Secondary | ICD-10-CM | POA: Diagnosis present

## 2014-07-12 DIAGNOSIS — Z7901 Long term (current) use of anticoagulants: Secondary | ICD-10-CM

## 2014-07-12 DIAGNOSIS — E871 Hypo-osmolality and hyponatremia: Secondary | ICD-10-CM | POA: Diagnosis not present

## 2014-07-12 DIAGNOSIS — K2270 Barrett's esophagus without dysplasia: Secondary | ICD-10-CM | POA: Diagnosis present

## 2014-07-12 DIAGNOSIS — I2699 Other pulmonary embolism without acute cor pulmonale: Principal | ICD-10-CM | POA: Diagnosis present

## 2014-07-12 DIAGNOSIS — N179 Acute kidney failure, unspecified: Secondary | ICD-10-CM

## 2014-07-12 DIAGNOSIS — K219 Gastro-esophageal reflux disease without esophagitis: Secondary | ICD-10-CM | POA: Diagnosis present

## 2014-07-12 DIAGNOSIS — Z79899 Other long term (current) drug therapy: Secondary | ICD-10-CM

## 2014-07-12 DIAGNOSIS — R079 Chest pain, unspecified: Secondary | ICD-10-CM

## 2014-07-12 DIAGNOSIS — I519 Heart disease, unspecified: Secondary | ICD-10-CM | POA: Diagnosis not present

## 2014-07-12 DIAGNOSIS — R0600 Dyspnea, unspecified: Secondary | ICD-10-CM

## 2014-07-12 DIAGNOSIS — I10 Essential (primary) hypertension: Secondary | ICD-10-CM | POA: Diagnosis present

## 2014-07-12 DIAGNOSIS — J9811 Atelectasis: Secondary | ICD-10-CM | POA: Diagnosis not present

## 2014-07-12 DIAGNOSIS — N281 Cyst of kidney, acquired: Secondary | ICD-10-CM | POA: Diagnosis not present

## 2014-07-12 HISTORY — DX: Acute kidney failure, unspecified: N17.9

## 2014-07-12 HISTORY — DX: Retention of urine, unspecified: R33.9

## 2014-07-12 HISTORY — DX: Essential (primary) hypertension: I10

## 2014-07-12 LAB — GLUCOSE, CAPILLARY
GLUCOSE-CAPILLARY: 262 mg/dL — AB (ref 65–99)
Glucose-Capillary: 201 mg/dL — ABNORMAL HIGH (ref 65–99)

## 2014-07-12 LAB — BASIC METABOLIC PANEL
ANION GAP: 16 — AB (ref 5–15)
ANION GAP: 10 (ref 5–15)
ANION GAP: 11 (ref 5–15)
Anion gap: 8 (ref 5–15)
BUN: 37 mg/dL — AB (ref 6–20)
BUN: 33 mg/dL — ABNORMAL HIGH (ref 6–20)
BUN: 31 mg/dL — AB (ref 6–20)
BUN: 29 mg/dL — ABNORMAL HIGH (ref 6–20)
CALCIUM: 8.6 mg/dL — AB (ref 8.9–10.3)
CHLORIDE: 88 mmol/L — AB (ref 101–111)
CHLORIDE: 87 mmol/L — AB (ref 101–111)
CO2: 23 mmol/L (ref 22–32)
CO2: 27 mmol/L (ref 22–32)
CO2: 27 mmol/L (ref 22–32)
CO2: 27 mmol/L (ref 22–32)
CREATININE: 3.28 mg/dL — AB (ref 0.61–1.24)
CREATININE: 2.03 mg/dL — AB (ref 0.61–1.24)
Calcium: 8.4 mg/dL — ABNORMAL LOW (ref 8.9–10.3)
Calcium: 8.7 mg/dL — ABNORMAL LOW (ref 8.9–10.3)
Calcium: 8.7 mg/dL — ABNORMAL LOW (ref 8.9–10.3)
Chloride: 80 mmol/L — ABNORMAL LOW (ref 101–111)
Chloride: 87 mmol/L — ABNORMAL LOW (ref 101–111)
Creatinine, Ser: 2.26 mg/dL — ABNORMAL HIGH (ref 0.61–1.24)
Creatinine, Ser: 1.85 mg/dL — ABNORMAL HIGH (ref 0.61–1.24)
GFR calc Af Amer: 20 mL/min — ABNORMAL LOW (ref >60)
GFR calc Af Amer: 36 mL/min — ABNORMAL LOW (ref >60)
GFR calc non Af Amer: 17 mL/min — ABNORMAL LOW (ref >60)
GFR calc non Af Amer: 31 mL/min — ABNORMAL LOW (ref >60)
GFR calc non Af Amer: 34 mL/min — ABNORMAL LOW (ref >60)
GFR, EST AFRICAN AMERICAN: 31 mL/min — AB (ref >60)
GFR, EST AFRICAN AMERICAN: 40 mL/min — AB (ref >60)
GFR, EST NON AFRICAN AMERICAN: 27 mL/min — AB (ref >60)
GLUCOSE: 276 mg/dL — AB (ref 65–99)
GLUCOSE: 276 mg/dL — AB (ref 65–99)
Glucose, Bld: 262 mg/dL — ABNORMAL HIGH (ref 65–99)
Glucose, Bld: 227 mg/dL — ABNORMAL HIGH (ref 65–99)
POTASSIUM: 4.5 mmol/L (ref 3.5–5.1)
Potassium: 4.1 mmol/L (ref 3.5–5.1)
Potassium: 4.6 mmol/L (ref 3.5–5.1)
Potassium: 4.3 mmol/L (ref 3.5–5.1)
SODIUM: 123 mmol/L — AB (ref 135–145)
SODIUM: 125 mmol/L — AB (ref 135–145)
Sodium: 119 mmol/L — CL (ref 135–145)
Sodium: 124 mmol/L — ABNORMAL LOW (ref 135–145)

## 2014-07-12 LAB — URINALYSIS, ROUTINE W REFLEX MICROSCOPIC
Bilirubin Urine: NEGATIVE
Glucose, UA: NEGATIVE mg/dL
Ketones, ur: NEGATIVE mg/dL
Leukocytes, UA: NEGATIVE
NITRITE: NEGATIVE
PROTEIN: NEGATIVE mg/dL
SPECIFIC GRAVITY, URINE: 1.006 (ref 1.005–1.030)
UROBILINOGEN UA: 0.2 mg/dL (ref 0.0–1.0)
pH: 5.5 (ref 5.0–8.0)

## 2014-07-12 LAB — I-STAT TROPONIN, ED: TROPONIN I, POC: 0.03 ng/mL (ref 0.00–0.08)

## 2014-07-12 LAB — CBC WITH DIFFERENTIAL/PLATELET
BASOS PCT: 0 % (ref 0–1)
Basophils Absolute: 0 10*3/uL (ref 0.0–0.1)
EOS ABS: 0.2 10*3/uL (ref 0.0–0.7)
Eosinophils Relative: 1 % (ref 0–5)
HCT: 28.2 % — ABNORMAL LOW (ref 39.0–52.0)
Hemoglobin: 10.1 g/dL — ABNORMAL LOW (ref 13.0–17.0)
LYMPHS PCT: 8 % — AB (ref 12–46)
Lymphs Abs: 1.2 10*3/uL (ref 0.7–4.0)
MCH: 29.4 pg (ref 26.0–34.0)
MCHC: 35.8 g/dL (ref 30.0–36.0)
MCV: 82 fL (ref 78.0–100.0)
MONOS PCT: 10 % (ref 3–12)
Monocytes Absolute: 1.6 10*3/uL — ABNORMAL HIGH (ref 0.1–1.0)
Neutro Abs: 12.9 10*3/uL — ABNORMAL HIGH (ref 1.7–7.7)
Neutrophils Relative %: 81 % — ABNORMAL HIGH (ref 43–77)
PLATELETS: 242 10*3/uL (ref 150–400)
RBC: 3.44 MIL/uL — AB (ref 4.22–5.81)
RDW: 13.5 % (ref 11.5–15.5)
WBC: 16 10*3/uL — AB (ref 4.0–10.5)

## 2014-07-12 LAB — D-DIMER, QUANTITATIVE (NOT AT ARMC)

## 2014-07-12 LAB — URINE MICROSCOPIC-ADD ON

## 2014-07-12 LAB — OSMOLALITY: Osmolality: 277 mOsm/kg (ref 275–300)

## 2014-07-12 LAB — BRAIN NATRIURETIC PEPTIDE: B Natriuretic Peptide: 531.8 pg/mL — ABNORMAL HIGH (ref 0.0–100.0)

## 2014-07-12 LAB — CREATININE, URINE, RANDOM: Creatinine, Urine: 51.73 mg/dL

## 2014-07-12 MED ORDER — SODIUM CHLORIDE 0.9 % IJ SOLN
3.0000 mL | Freq: Two times a day (BID) | INTRAMUSCULAR | Status: DC
  Administered 2014-07-12 – 2014-07-14 (×5): 3 mL via INTRAVENOUS

## 2014-07-12 MED ORDER — ONDANSETRON HCL 4 MG/2ML IJ SOLN: 4.0000 mg | Freq: Four times a day (QID) | INTRAMUSCULAR | Status: DC | PRN

## 2014-07-12 MED ORDER — ASPIRIN EC 81 MG PO TBEC
81.0000 mg | DELAYED_RELEASE_TABLET | Freq: Every day | ORAL | Status: DC
  Administered 2014-07-12 – 2014-07-14 (×3): 81 mg via ORAL
  Filled 2014-07-12 (×3): qty 1

## 2014-07-12 MED ORDER — ENSURE ENLIVE PO LIQD
237.0000 mL | Freq: Two times a day (BID) | ORAL | Status: DC
  Administered 2014-07-12 – 2014-07-14 (×4): 237 mL via ORAL

## 2014-07-12 MED ORDER — QUETIAPINE FUMARATE 50 MG PO TABS
50.0000 mg | ORAL_TABLET | Freq: Every day | ORAL | Status: DC
  Administered 2014-07-12 – 2014-07-13 (×2): 50 mg via ORAL
  Filled 2014-07-12 (×2): qty 1

## 2014-07-12 MED ORDER — ENOXAPARIN SODIUM 80 MG/0.8ML ~~LOC~~ SOLN
70.0000 mg | Freq: Two times a day (BID) | SUBCUTANEOUS | Status: DC
  Administered 2014-07-12 – 2014-07-13 (×3): 70 mg via SUBCUTANEOUS
  Filled 2014-07-12 (×5): qty 0.8

## 2014-07-12 MED ORDER — INSULIN ASPART 100 UNIT/ML ~~LOC~~ SOLN
0.0000 [IU] | Freq: Three times a day (TID) | SUBCUTANEOUS | Status: DC
  Administered 2014-07-12: 5 [IU] via SUBCUTANEOUS
  Administered 2014-07-13 (×2): 2 [IU] via SUBCUTANEOUS
  Administered 2014-07-13: 1 [IU] via SUBCUTANEOUS
  Administered 2014-07-14: 5 [IU] via SUBCUTANEOUS
  Administered 2014-07-14: 1 [IU] via SUBCUTANEOUS

## 2014-07-12 MED ORDER — ONDANSETRON HCL 4 MG PO TABS: 4.0000 mg | ORAL_TABLET | Freq: Four times a day (QID) | ORAL | Status: DC | PRN

## 2014-07-12 NOTE — ED Notes (Signed)
Admitting phy at bedside

## 2014-07-12 NOTE — ED Provider Notes (Signed)
CSN: [id number]     Arrival date & time 07/12/14  0443 History   First MD Initiated Contact with Patient 07/12/14 0448     Chief Complaint  Patient presents with  . Chest Pain     (Consider location/radiation/quality/duration/timing/severity/associated sxs/prior Treatment) Patient is a 74 y.o. male presenting with chest pain. The history is provided by the patient. No language interpreter was used.  Chest Pain Pain location:  Substernal area Pain quality: pressure   Pain radiates to:  Does not radiate Pain radiates to the back: no   Pain severity:  Moderate Onset quality:  Sudden Duration:  1 hour Timing:  Constant Progression:  Resolved Chronicity:  New Context comment:  Walking to the bathroom Relieved by:  Rest Worsened by:  Nothing tried Ineffective treatments:  None tried Associated symptoms: abdominal pain, diaphoresis, lower extremity edema, nausea and shortness of breath   Associated symptoms: no back pain, no cough, no dizziness, no dysphagia, no fatigue, no fever, no headache, no numbness, not vomiting and no weakness   Abdominal pain:    Location:  Generalized   Quality:  Aching and pressure   Severity:  Severe   Onset quality:  Gradual   Timing:  Intermittent   Progression:  Waxing and waning   Chronicity:  New   Past Medical History  Diagnosis Date  . Coronary artery disease   . Diabetes mellitus without complication   . 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 Hepatitis     C       "Has taken Harvoni"  . Arthritis    Past Surgical History  Procedure Laterality Date  . Cardiac catheterization      x 3    Last one was in 2011 @ [facility]  . Tonsillectomy    . 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 History  Substance Use Topics  . Smoking status: Former  Smoker -- 1.00 packs/day for 50 years    Types: Cigarettes  . Smokeless tobacco: Not on file     Comment: quit 30 yrs  . Alcohol Use: No     Comment: quit 30 yrs ago    Review of Systems  Constitutional: Positive for diaphoresis. Negative for fever, activity change, appetite change and fatigue.  HENT: Negative for congestion, facial swelling, rhinorrhea and trouble swallowing.   Eyes: Negative for photophobia and pain.  Respiratory: Positive for shortness of breath. Negative for cough and chest tightness.   Cardiovascular: Positive for chest pain. Negative for leg swelling.  Gastrointestinal: Positive for nausea and abdominal pain. Negative for vomiting, diarrhea and constipation.  Endocrine: Negative for polydipsia and polyuria.  Genitourinary: Positive for decreased urine volume. Negative for dysuria, urgency and difficulty urinating.  Musculoskeletal: Negative for back pain and gait problem.  Skin: Negative for color change, rash and wound.  Allergic/Immunologic: Negative for immunocompromised state.  Neurological: Negative for dizziness, facial asymmetry, speech difficulty, weakness, numbness and headaches.  Psychiatric/Behavioral: Negative for confusion, decreased concentration and agitation.      Allergies  Review of patient's allergies indicates no known allergies.  Home Medications   Prior to Admission medications   Medication Sig Start Date End Date Taking? Authorizing Provider  CALCIUM PO Take 800 mg by mouth daily.    Historical Provider, MD  enoxaparin (LOVENOX)  40 MG/0.4ML injection Inject 0.4 mLs (40 mg total) into the skin daily. 07/04/14   Carlynn Spry, PA-C  glipiZIDE (GLUCOTROL XL) 5 MG 24 hr tablet Take 5 mg by mouth daily as needed. If BS >120 04/21/14   Historical Provider, MD  hydrochlorothiazide (HYDRODIURIL) 25 MG tablet Take 25 mg by mouth daily. 05/25/14   Historical Provider, MD  MAGNESIUM GLYCINATE PLUS PO Take 1 tablet by mouth daily.    Historical  Provider, MD  methocarbamol (ROBAXIN) 500 MG tablet Take 1-2 tablets (500-1,000 mg total) by mouth every 6 (six) hours as needed for muscle spasms. 07/04/14   Carlynn Spry, PA-C  metoprolol succinate (TOPROL-XL) 25 MG 24 hr tablet Take 25 mg by mouth daily.    Historical Provider, MD  Multiple Vitamin (MULTIVITAMIN) tablet Take 1 tablet by mouth daily.    Historical Provider, MD  oxyCODONE (OXY IR/ROXICODONE) 5 MG immediate release tablet Take 1-2 tablets (5-10 mg total) by mouth every 4 (four) hours as needed for breakthrough pain. 07/04/14   Carlynn Spry, PA-C  OxyCODONE (OXYCONTIN) 10 mg T12A 12 hr tablet Take 1 tablet (10 mg total) by mouth every 12 (twelve) hours. 07/04/14   Carlynn Spry, PA-C  pantoprazole (PROTONIX) 40 MG tablet Take 40 mg by mouth 3 (three) times a week.    Historical Provider, MD  QUEtiapine (SEROQUEL) 25 MG tablet Take 50 mg by mouth at bedtime.    Historical Provider, MD  rosuvastatin (CRESTOR) 5 MG tablet Take 5 mg by mouth daily.    Historical Provider, MD  verapamil (CALAN) 120 MG tablet Take 120 mg by mouth daily.    Historical Provider, MD  zaleplon (SONATA) 10 MG capsule Take 10 mg by mouth at bedtime as needed for sleep.    Historical Provider, MD   BP 108/61 mmHg  Pulse 96  Temp(Src) 98.3 F (36.8 C) (Oral)  Resp 18  SpO2 97% Physical Exam  Constitutional: He is oriented to person, place, and time. He appears well-developed and well-nourished. No distress.  HENT:  Head: Normocephalic and atraumatic.  Mouth/Throat: No oropharyngeal exudate.  Eyes: Pupils are equal, round, and reactive to light.  Neck: Normal range of motion. Neck supple.  Cardiovascular: Regular rhythm and normal heart sounds.  Tachycardia present.  Exam reveals no gallop and no friction rub.   No murmur heard. Pulmonary/Chest: Effort normal and breath sounds normal. No respiratory distress. He has no wheezes. He has no rales.  Abdominal: Soft. Bowel sounds are normal. He exhibits  distension. He exhibits no mass. There is generalized tenderness. There is no rebound and no guarding.  Firm abdomen with greatly distended bladder  Musculoskeletal: Normal range of motion. He exhibits no edema or tenderness.  Neurological: He is alert and oriented to person, place, and time.  Skin: Skin is warm and dry.  Psychiatric: He has a normal mood and affect.    ED Course  Procedures (including critical care time) Labs Review Labs Reviewed  CBC WITH DIFFERENTIAL/PLATELET - Abnormal; Notable for the following:    WBC 16.0 (*)    RBC 3.44 (*)    Hemoglobin 10.1 (*)    HCT 28.2 (*)    Neutrophils Relative % 81 (*)    Neutro Abs 12.9 (*)    Lymphocytes Relative 8 (*)    Monocytes Absolute 1.6 (*)    All other components within normal limits  BRAIN NATRIURETIC PEPTIDE - Abnormal; Notable for the following:    B Natriuretic Peptide 531.8 (*)  All other components within normal limits  BASIC METABOLIC PANEL - Abnormal; Notable for the following:    Sodium 119 (*)    Chloride 80 (*)    Glucose, Bld 276 (*)    BUN 37 (*)    Creatinine, Ser 3.28 (*)    Calcium 8.6 (*)    GFR calc non Af Amer 17 (*)    GFR calc Af Amer 20 (*)    Anion gap 16 (*)    All other components within normal limits  D-DIMER, QUANTITATIVE (NOT AT [facility]) - Abnormal; Notable for the following:    D-Dimer, Quant >20.00 (*)    All other components within normal limits  URINALYSIS, ROUTINE W REFLEX MICROSCOPIC (NOT AT [facility]) - Abnormal; Notable for the following:    Hgb urine dipstick MODERATE (*)    All other components within normal limits  URINE MICROSCOPIC-ADD ON  I-STAT TROPOININ, ED    Imaging Review Dg Chest Portable 1 View  07/12/2014   CLINICAL DATA:  Chest tightness and shortness of breath since this morning. Recent knee replacement 1 week prior.  EXAM: PORTABLE CHEST - 1 VIEW  COMPARISON:  06/23/2014  FINDINGS: Lower lung volumes from prior exam. The heart size and mediastinal contours are  unchanged allowing for differences in technique. There is linear atelectasis in the left lower lung zone. No consolidation, large pleural effusion or pneumothorax. No pulmonary edema. No acute osseous abnormalities are seen.  IMPRESSION: Linear atelectasis at the left lung base.   Electronically Signed   By: Jeb Levering M.D.   On: 07/12/2014 06:39     EKG Interpretation   Date/Time:  Wednesday July 12 2014 04:53:54 EDT Ventricular Rate:  105 PR Interval:  140 QRS Duration: 126 QT Interval:  350 QTC Calculation: 463 R Axis:   -35 Text Interpretation:  Sinus tachycardia LVH with IVCD and secondary repol  abnrm Probable lateral infarct, age indeterminate Anterior ST elevation,  probably due to LVH Confirmed by [place]  MD, [place] [phone]) on 07/12/2014  4:57:20 AM      MDM   Final diagnoses:  Acute renal failure, unspecified acute renal failure type  Hyponatremia  Chest pain, unspecified chest pain type  Dyspnea    Pt is a 74 y.o. male with Pmhx as above who presents with onset of CP/SOB while walking to the bathroom to try to urinate after onset of abdominal cramping. Pt had recent R total knee replacement, has had some difficulty with incomplete emptying this week as well as post-op scrotal/penile edema. Pt reports assoc diaphoresis/nasuea with onset or pain, currently CP, SOB improved though abdomen still painful/distended. Also has not had BM since prior to surgery. Bladder scan shows >1L, EKG with unchanged LVH, tachycardia. Os sat 88% on RA. Lungs clear, +edema in post-op L leg. Catheter placed with >2200cc urine out, improvement of abdominal pain. Labs show tripling of Cr, Na 119, D dimer >20, however cannot get CTA due to Cr. Pt has been receiving lovenox SQ at home. I spoke w/ pt's Surgeon, Dr. Lorre Nick, I feel pt best served on a medical service, teaching service to admit, will decide on VQ in morning or hold on CTA until Cr resolved.       Ernestina Patches, MD 07/13/14 [phone]

## 2014-07-12 NOTE — ED Notes (Addendum)
Sodium 119 reported to Dr Tawnya Crook

## 2014-07-12 NOTE — ED Notes (Signed)
Pt arrives via [facility] from home c/o CP and scrotal pain.  Pt reports CP woke him from sleep, reported dizziness, lightheadedness, nausea, diaphoresis.  Pt reports swollen, painful scrotum.  EMS reports pt at 88% O2 sats, placed pt on 4L O2, satting at 94%.  Pt appears pale, diaphoretic.

## 2014-07-12 NOTE — Progress Notes (Signed)
ANTICOAGULATION CONSULT NOTE - Initial Consult  Pharmacy Consult for Lovenox Indication: r/o PE  No Known Allergies  Patient Measurements:    Vital Signs: Temp: 98.3 F (36.8 C) (06/01 0503) Temp Source: Oral (06/01 0503) BP: 108/61 mmHg (06/01 0700) Pulse Rate: 96 (06/01 0700)  Labs:  Recent Labs  07/12/14 0501  HGB 10.1*  HCT 28.2*  PLT 242  CREATININE 3.28*    CrCl cannot be calculated (Unknown ideal weight.).   Medical History: Past Medical History  Diagnosis Date  . Coronary artery disease   . Diabetes mellitus without complication   . 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 Hepatitis     C       "Has taken Harvoni"  . Arthritis     Medications:  Oscal  Glucotrol  HCTZ  Toprol XL  MVI  Protonix  Seroquel  Crestor  Verapamil  Sonata    Assessment: 74 y.o. male with chest pain/SOB, s/p recent TKR, for Lovenox  Goal of Therapy:  Full anticoagulation for Lovenox Monitor platelets by anticoagulation protocol: Yes   Plan:  Lovenox 70 mg SQ q12h  Caryl Pina 07/12/2014,7:29 AM

## 2014-07-12 NOTE — H&P (Signed)
Date: 07/12/2014               Patient Name:  Christian Patton MRN: [id number]  DOB: 09/13/1940 Age / Sex: 74 y.o., male   PCP: Laverna Peace, NP         Medical Service: Internal Medicine Teaching Service         Attending Physician: Dr. Ernestina Patches, MD    First Contact: Dr. Genene Churn Pager: [phone]  Second Contact: Dr. Naaman Plummer Pager: [phone]       After Hours (After 5p/  First Contact Pager: [phone]  weekends / holidays): Second Contact Pager: [phone]   Chief Complaint: Abdominal pain and SOB  History of Present Illness: Christian Patton 74 yo man pmh DM, CAD, recent left knee arthroplasty 07/03/14 presents with acute worsening abdominal pain. Pt along with his wife give the history. Pt states that even before discharge from the hospital post-op he was having some problem initiating urination. But had a "small trickle" on the day of discharge. He continued to have trouble initiating urination and some increasing abdominal pain that he describes as lower from his penis to his lower abdomen. He then began having some nausea and significant anorexia. The patient has noticed some weight gain and LE edema. He also had swelling of his scrotum and penis but that is improving. He has been ambulating no significant pain in the knee and his wife has been giving him the lovenox shots and opiates (scheduled). This AM the pt went to go to the bathroom and then felt clammy and had central chest pain and dyspnea before calling EMS. Pt denied any HA, fevers/chills, vomiting, areas of bleeding, syncope, or hematuria.   Meds: No current facility-administered medications for this encounter.   Current Outpatient Prescriptions  Medication Sig Dispense Refill  . CALCIUM PO Take 800 mg by mouth daily.    Marland Kitchen enoxaparin (LOVENOX) 40 MG/0.4ML injection Inject 0.4 mLs (40 mg total) into the skin daily. 13 Syringe 0  . glipiZIDE (GLUCOTROL XL) 5 MG 24 hr tablet Take 5 mg by mouth daily as needed. If BS >120  0  .  hydrochlorothiazide (HYDRODIURIL) 25 MG tablet Take 25 mg by mouth daily.  0  . MAGNESIUM GLYCINATE PLUS PO Take 1 tablet by mouth daily.    . methocarbamol (ROBAXIN) 500 MG tablet Take 1-2 tablets (500-1,000 mg total) by mouth every 6 (six) hours as needed for muscle spasms. 60 tablet 2  . metoprolol succinate (TOPROL-XL) 25 MG 24 hr tablet Take 25 mg by mouth daily.    . Multiple Vitamin (MULTIVITAMIN) tablet Take 1 tablet by mouth daily.    Marland Kitchen oxyCODONE (OXY IR/ROXICODONE) 5 MG immediate release tablet Take 1-2 tablets (5-10 mg total) by mouth every 4 (four) hours as needed for breakthrough pain. 90 tablet 0  . OxyCODONE (OXYCONTIN) 10 mg T12A 12 hr tablet Take 1 tablet (10 mg total) by mouth every 12 (twelve) hours. 30 tablet 0  . pantoprazole (PROTONIX) 40 MG tablet Take 40 mg by mouth 3 (three) times a week.    Marland Kitchen QUEtiapine (SEROQUEL) 25 MG tablet Take 50 mg by mouth at bedtime.    . rosuvastatin (CRESTOR) 5 MG tablet Take 5 mg by mouth daily.    . verapamil (CALAN) 120 MG tablet Take 120 mg by mouth daily.    . zaleplon (SONATA) 10 MG capsule Take 10 mg by mouth at bedtime as needed for sleep.     Allergies: Allergies as of 07/12/2014  . (  No Known Allergies)   Past Medical History  Diagnosis Date  . Coronary artery disease   . Diabetes mellitus without complication   . 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 Hepatitis     C       "Has taken Harvoni"  . Arthritis    Past Surgical History  Procedure Laterality Date  . Cardiac catheterization      x 3    Last one was in 2011 @ [facility]  . Tonsillectomy    . 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 History   Social History  . Marital Status: Married    Spouse Name: N/A  . Number of Children: N/A  . Years of Education: N/A    Occupational History  . Not on file.   Social History Main Topics  . Smoking status: Former Smoker -- 1.00 packs/day for 50 years    Types: Cigarettes  . Smokeless tobacco: Not on file     Comment: quit 30 yrs  . Alcohol Use: No     Comment: quit 30 yrs ago  . Drug Use: No  . Sexual Activity: Not on file   Other Topics Concern  . Not on file   Social History Narrative  Review of Systems: Pertinent items are noted in HPI.  Physical Exam: Blood pressure 108/61, pulse 96, temperature 98.3 F (36.8 C), temperature source Oral, resp. rate 18, SpO2 97 %. General: resting in bed, NAD HEENT: PERRL, EOMI, no scleral icterus Cardiac: tachy with regular rhythm, no rubs, 2/6 systolic murmur LSB w/o radiation, no gallops Pulm: clear to auscultation bilaterally,no crackles, wheezes or rhonchi, moving normal volumes of air Abd: soft, nontender, nondistended, BS present, some ecchymosis at injection sites GU: foley in place, large diffuse ecchymosis of scrotum and penis Ext: warm and well perfused, 1+ pedal edema bilaterally, left knee in compression stocking, 5/5 LE strength bilaterally, large ecchymosis of left thigh Neuro: alert and oriented X3, cranial nerves II-XII grossly intact  Lab results: Basic Metabolic Panel:  Recent Labs  07/12/14 0501  NA 119*  K 4.1  CL 80*  CO2 23  GLUCOSE 276*  BUN 37*  CREATININE 3.28*  CALCIUM 8.6*   CBC:  Recent Labs  07/12/14 0501  WBC 16.0*  NEUTROABS 12.9*  HGB 10.1*  HCT 28.2*  MCV 82.0  PLT 242   D-Dimer:  Recent Labs  07/12/14 0501  DDIMER >20.00*   Urinalysis:  Recent Labs  07/12/14 0556  COLORURINE YELLOW  LABSPEC 1.006  PHURINE 5.5  GLUCOSEU NEGATIVE  HGBUR MODERATE*  BILIRUBINUR NEGATIVE  KETONESUR NEGATIVE  PROTEINUR NEGATIVE  UROBILINOGEN 0.2  NITRITE NEGATIVE  LEUKOCYTESUR NEGATIVE   Imaging results:  Dg Chest Portable 1 View  07/12/2014   CLINICAL DATA:  Chest tightness and shortness of breath  since this morning. Recent knee replacement 1 week prior.  EXAM: PORTABLE CHEST - 1 VIEW  COMPARISON:  06/23/2014  FINDINGS: Lower lung volumes from prior exam. The heart size and mediastinal contours are unchanged allowing for differences in technique. There is linear atelectasis in the left lower lung zone. No consolidation, large pleural effusion or pneumothorax. No pulmonary edema. No acute osseous abnormalities are seen.  IMPRESSION: Linear atelectasis at the left lung base.   Electronically Signed   By:  Jeb Levering M.D.   On: 07/12/2014 06:39    Other results: EKG: nonspecific ST and T waves changes, sinus tachycardia.  Assessment & Plan by Problem: 1. Abdominal pain 2/2 acute urinary retention: Pt abdominal pain in suprapubic distribution and 2200 cc of urine drained with foley insertion in ED. This is likely in the setting of previous anesthesia and continued opiate use since pt describes having difficulty up until discharge. Pt has no physical exam findings to suggest stroke of spinal pathology.  -cont foley  2. Acute renal failure 2/2 acute urinary retention: Pt Cr baseline 1.03 at discharge on 07/03/14. Today Cr 3.28. AG of 16 likely uremia in setting of ARF. This is likely obstructive in nature given large volume of urine. Pt previously on diuretics therefore can't calculate FeNa. Pt also fluid up with some pitting edema on exam, elevated BNP (531) and weight gain + -renal US -urine urea, UCr -cont foley  3. Acute/subacute Hyponatremia in the setting of ARF 2/2 urinary retention: Pt Na 119 today (138 8 days ago) no signs of seizure activity or AMS therefore no need for urgent correction. Will not use NS in edematous state. If etiology is acute urinary retention  -repeat Bmet q4h  -sOsm -may need to consider Lasix -continue fluid restriction  4. Acute dyspnea: CXR w/o infiltrate to suggest acute heart failure, EKG with tachycardia and nonspecific ST changes therefore less likely  ischemic cardiac pathology. Elevated BNP likely in the setting of ARF. Pt modified geneva score 8+ elevated D-dimer >20 and initial presentation of tachy (>100) and hypoxia (80s%) gives high probability for PE. Unable to get CTA in the setting of ARF.  -therapeutic dose lovenox -daily weight, I&O -can consider V/Q scan   5. Acute Knee replacement: Dr. Lorre Nick is surgeon and can be reached at [phone] for questions. Was already informed of pt admittance and stated that intraoperatively pt received femoral nerve block and anesthesia nicked femoral vein to cause large scrotal and left thigh ecchymosis. Hgb stable  -repeat CBC in AM  6. DM type 2: last HgbA1c 6.4 on 06/2014 on oral medications at home.  -SSI  7. Hypertension: pt normotensive today. Hold all BP meds. Continue to monitor.   Dispo: Disposition is deferred at this time, awaiting improvement of current medical problems. Anticipated discharge in approximately 2-3 day(s).   The patient does have a current PCP (Laverna Peace, NP) with [facility] and does need an [facility] hospital follow-up appointment after discharge.  The patient does not have transportation limitations that hinder transportation to clinic appointments.  Signed: Jerrye Noble, MD 07/12/2014, 7:29 AM

## 2014-07-12 NOTE — Progress Notes (Signed)
UR Completed Delford Wingert Graves-Bigelow, RN,BSN [phone]  

## 2014-07-13 ENCOUNTER — Encounter (HOSPITAL_COMMUNITY): Payer: Self-pay

## 2014-07-13 ENCOUNTER — Inpatient Hospital Stay (HOSPITAL_COMMUNITY): Payer: Medicare Other

## 2014-07-13 ENCOUNTER — Other Ambulatory Visit (HOSPITAL_COMMUNITY): Payer: Medicare Other

## 2014-07-13 DIAGNOSIS — R0789 Other chest pain: Secondary | ICD-10-CM

## 2014-07-13 DIAGNOSIS — R339 Retention of urine, unspecified: Secondary | ICD-10-CM

## 2014-07-13 DIAGNOSIS — I2699 Other pulmonary embolism without acute cor pulmonale: Principal | ICD-10-CM

## 2014-07-13 DIAGNOSIS — E871 Hypo-osmolality and hyponatremia: Secondary | ICD-10-CM

## 2014-07-13 DIAGNOSIS — I519 Heart disease, unspecified: Secondary | ICD-10-CM

## 2014-07-13 DIAGNOSIS — R0902 Hypoxemia: Secondary | ICD-10-CM

## 2014-07-13 DIAGNOSIS — Z96652 Presence of left artificial knee joint: Secondary | ICD-10-CM

## 2014-07-13 DIAGNOSIS — N179 Acute kidney failure, unspecified: Secondary | ICD-10-CM

## 2014-07-13 DIAGNOSIS — E119 Type 2 diabetes mellitus without complications: Secondary | ICD-10-CM

## 2014-07-13 LAB — CBC
HCT: 26.7 % — ABNORMAL LOW (ref 39.0–52.0)
Hemoglobin: 9.4 g/dL — ABNORMAL LOW (ref 13.0–17.0)
MCH: 29.9 pg (ref 26.0–34.0)
MCHC: 35.2 g/dL (ref 30.0–36.0)
MCV: 85 fL (ref 78.0–100.0)
Platelets: 165 10*3/uL (ref 150–400)
RBC: 3.14 MIL/uL — ABNORMAL LOW (ref 4.22–5.81)
RDW: 13.8 % (ref 11.5–15.5)
WBC: 9 10*3/uL (ref 4.0–10.5)

## 2014-07-13 LAB — UREA NITROGEN, URINE: Urea Nitrogen, Ur: 248 mg/dL

## 2014-07-13 LAB — BASIC METABOLIC PANEL
Anion gap: 9 (ref 5–15)
BUN: 18 mg/dL (ref 6–20)
CALCIUM: 8.7 mg/dL — AB (ref 8.9–10.3)
CO2: 28 mmol/L (ref 22–32)
Chloride: 93 mmol/L — ABNORMAL LOW (ref 101–111)
Creatinine, Ser: 0.96 mg/dL (ref 0.61–1.24)
GFR calc non Af Amer: >60 mL/min (ref >60)
Glucose, Bld: 201 mg/dL — ABNORMAL HIGH (ref 65–99)
Potassium: 4.8 mmol/L (ref 3.5–5.1)
Sodium: 130 mmol/L — ABNORMAL LOW (ref 135–145)

## 2014-07-13 LAB — COMPREHENSIVE METABOLIC PANEL
ALT: 15 U/L — ABNORMAL LOW (ref 17–63)
AST: 18 U/L (ref 15–41)
Albumin: 2.8 g/dL — ABNORMAL LOW (ref 3.5–5.0)
Alkaline Phosphatase: 48 U/L (ref 38–126)
Anion gap: 11 (ref 5–15)
BUN: 24 mg/dL — ABNORMAL HIGH (ref 6–20)
CO2: 27 mmol/L (ref 22–32)
Calcium: 8.6 mg/dL — ABNORMAL LOW (ref 8.9–10.3)
Chloride: 90 mmol/L — ABNORMAL LOW (ref 101–111)
Creatinine, Ser: 1.24 mg/dL (ref 0.61–1.24)
GFR calc Af Amer: >60 mL/min (ref >60)
GFR calc non Af Amer: 56 mL/min — ABNORMAL LOW (ref >60)
Glucose, Bld: 188 mg/dL — ABNORMAL HIGH (ref 65–99)
Potassium: 4.5 mmol/L (ref 3.5–5.1)
Sodium: 128 mmol/L — ABNORMAL LOW (ref 135–145)
Total Bilirubin: 1.1 mg/dL (ref 0.3–1.2)
Total Protein: 6 g/dL — ABNORMAL LOW (ref 6.5–8.1)

## 2014-07-13 LAB — GLUCOSE, CAPILLARY
GLUCOSE-CAPILLARY: 196 mg/dL — AB (ref 65–99)
Glucose-Capillary: 178 mg/dL — ABNORMAL HIGH (ref 65–99)
Glucose-Capillary: 134 mg/dL — ABNORMAL HIGH (ref 65–99)
Glucose-Capillary: 277 mg/dL — ABNORMAL HIGH (ref 65–99)

## 2014-07-13 LAB — MAGNESIUM: Magnesium: 1.8 mg/dL (ref 1.7–2.4)

## 2014-07-13 LAB — PHOSPHORUS: Phosphorus: 3.5 mg/dL (ref 2.5–4.6)

## 2014-07-13 LAB — MRSA PCR SCREENING: MRSA by PCR: NEGATIVE

## 2014-07-13 LAB — HIV ANTIBODY (ROUTINE TESTING W REFLEX): HIV Screen 4th Generation wRfx: NONREACTIVE

## 2014-07-13 MED ORDER — ENOXAPARIN SODIUM 80 MG/0.8ML ~~LOC~~ SOLN
80.0000 mg | Freq: Two times a day (BID) | SUBCUTANEOUS | Status: DC
  Administered 2014-07-13: 80 mg via SUBCUTANEOUS
  Filled 2014-07-13: qty 0.8

## 2014-07-13 MED ORDER — CALCIUM CARBONATE 1250 (500 CA) MG PO TABS
1.0000 | ORAL_TABLET | Freq: Every day | ORAL | Status: DC
  Administered 2014-07-14: 500 mg via ORAL
  Filled 2014-07-13: qty 1

## 2014-07-13 MED ORDER — METOPROLOL SUCCINATE ER 25 MG PO TB24
25.0000 mg | ORAL_TABLET | Freq: Every day | ORAL | Status: DC
  Administered 2014-07-14: 25 mg via ORAL
  Filled 2014-07-13: qty 1

## 2014-07-13 MED ORDER — PANTOPRAZOLE SODIUM 40 MG PO TBEC: 40.0000 mg | DELAYED_RELEASE_TABLET | Freq: Every day | ORAL | Status: DC | PRN

## 2014-07-13 MED ORDER — DEXTROSE 5 % IV SOLN
INTRAVENOUS | Status: DC
  Administered 2014-07-13: 10:00:00 via INTRAVENOUS

## 2014-07-13 MED ORDER — IOHEXOL 350 MG/ML SOLN
80.0000 mL | Freq: Once | INTRAVENOUS | Status: AC | PRN
  Administered 2014-07-13: 80 mL via INTRAVENOUS

## 2014-07-13 MED ORDER — DEXTROSE 5 % IV SOLN
INTRAVENOUS | Status: DC
  Administered 2014-07-13: 23:00:00 via INTRAVENOUS

## 2014-07-13 MED ORDER — ACETAMINOPHEN 325 MG PO TABS
650.0000 mg | ORAL_TABLET | Freq: Four times a day (QID) | ORAL | Status: DC | PRN
  Administered 2014-07-13 – 2014-07-14 (×2): 650 mg via ORAL
  Filled 2014-07-13 (×2): qty 2

## 2014-07-13 MED ORDER — ROSUVASTATIN CALCIUM 10 MG PO TABS
5.0000 mg | ORAL_TABLET | Freq: Every day | ORAL | Status: DC
  Administered 2014-07-14: 5 mg via ORAL
  Filled 2014-07-13: qty 1

## 2014-07-13 NOTE — Progress Notes (Signed)
Subjective:  Mr. Christian Patton is in no acute distress. He denies any acute chest pain, or SOB while lying in bed. He is currently feeling well and saturating well on his 2L [place] O2. He states, however, that when he get [place] to move he becomes acutely dyspneic.    Objective: Vital signs in last 24 hours: Filed Vitals:   07/12/14 1352 07/12/14 1943 07/13/14 0500 07/13/14 1355  BP:  134/54 122/65 141/59  Pulse:  101 82 88  Temp:  98.3 F (36.8 C) 98.1 F (36.7 C) 99.2 F (37.3 C)  TempSrc:  Oral Oral Oral  Resp:      Height:   _0  (1.651 m)   Weight:   77.61 kg (171 lb 1.6 oz)   SpO2: 91% 94% 98% 100%   Weight change:   Intake/Output Summary (Last 24 hours) at 07/13/14 1406 Last data filed at 07/13/14 0547  Gross per 24 hour  Intake      0 ml  Output   2650 ml  Net  -2650 ml    General: Resting in bed in NAD Cardiac: RRR, no rubs, murmurs or gallops Pulm: clear to auscultation bilaterally, moving normal volumes of air Abd: soft, nontender, nondistended, NABS Ext: warm and well perfused, no pedal edema. Severe bruising/ecchymoses on his left thigh. GU: His scrotum and penis are dark blue/purple in coloration and appear to be swollen, foley catheter in place.  Neuro: alert and oriented X3, cranial nerves II-XII grossly intact  Lab Results:  Admission on 07/12/2014  Component Date Value Ref Range Status  . WBC 07/12/2014 16.0* 4.0 - 10.5 K/uL Final  . RBC 07/12/2014 3.44* 4.22 - 5.81 MIL/uL Final  . Hemoglobin 07/12/2014 10.1* 13.0 - 17.0 g/dL Final  . HCT 07/12/2014 28.2* 39.0 - 52.0 % Final  . MCV 07/12/2014 82.0  78.0 - 100.0 fL Final  . MCH 07/12/2014 29.4  26.0 - 34.0 pg Final  . MCHC 07/12/2014 35.8  30.0 - 36.0 g/dL Final  . RDW 07/12/2014 13.5  11.5 - 15.5 % Final  . Platelets 07/12/2014 242  150 - 400 K/uL Final  . Neutrophils Relative % 07/12/2014 81* 43 - 77 % Final  . Neutro Abs 07/12/2014 12.9* 1.7 - 7.7 K/uL Final  . Lymphocytes Relative 07/12/2014 8* 12 - 46 %  Final  . Lymphs Abs 07/12/2014 1.2  0.7 - 4.0 K/uL Final  . Monocytes Relative 07/12/2014 10  3 - 12 % Final  . Monocytes Absolute 07/12/2014 1.6* 0.1 - 1.0 K/uL Final  . Eosinophils Relative 07/12/2014 1  0 - 5 % Final  . Eosinophils Absolute 07/12/2014 0.2  0.0 - 0.7 K/uL Final  . Basophils Relative 07/12/2014 0  0 - 1 % Final  . Basophils Absolute 07/12/2014 0.0  0.0 - 0.1 K/uL Final  . B Natriuretic Peptide 07/12/2014 531.8* 0.0 - 100.0 pg/mL Final  . Troponin i, poc 07/12/2014 0.03  0.00 - 0.08 ng/mL Final  . Comment 3 07/12/2014          Final  . Sodium 07/12/2014 119* 135 - 145 mmol/L Final  . Potassium 07/12/2014 4.1  3.5 - 5.1 mmol/L Final  . Chloride 07/12/2014 80* 101 - 111 mmol/L Final  . CO2 07/12/2014 23  22 - 32 mmol/L Final  . Glucose, Bld 07/12/2014 276* 65 - 99 mg/dL Final  . BUN 07/12/2014 37* 6 - 20 mg/dL Final  . Creatinine, Ser 07/12/2014 3.28* 0.61 - 1.24 mg/dL Final  . Calcium  07/12/2014 8.6* 8.9 - 10.3 mg/dL Final  . GFR calc non Af Amer 07/12/2014 17* >60 mL/min Final  . GFR calc Af Amer 07/12/2014 20* >60 mL/min Final  . Anion gap 07/12/2014 16* 5 - 15 Final  . D-Dimer, Quant 07/12/2014 >20.00* 0.00 - 0.48 ug/mL-FEU Final  . Color, Urine 07/12/2014 YELLOW  YELLOW Final  . APPearance 07/12/2014 CLEAR  CLEAR Final  . Specific Gravity, Urine 07/12/2014 1.006  1.005 - 1.030 Final  . pH 07/12/2014 5.5  5.0 - 8.0 Final  . Glucose, UA 07/12/2014 NEGATIVE  NEGATIVE mg/dL Final  . Hgb urine dipstick 07/12/2014 MODERATE* NEGATIVE Final  . Bilirubin Urine 07/12/2014 NEGATIVE  NEGATIVE Final  . Ketones, ur 07/12/2014 NEGATIVE  NEGATIVE mg/dL Final  . Protein, ur 07/12/2014 NEGATIVE  NEGATIVE mg/dL Final  . Urobilinogen, UA 07/12/2014 0.2  0.0 - 1.0 mg/dL Final  . Nitrite 07/12/2014 NEGATIVE  NEGATIVE Final  . Leukocytes, UA 07/12/2014 NEGATIVE  NEGATIVE Final  . Squamous Epithelial / LPF 07/12/2014 RARE  RARE Final  . RBC / HPF 07/12/2014 3-6  <3 RBC/hpf Final  .  Bacteria, UA 07/12/2014 RARE  RARE Final  . Sodium 07/12/2014 123* 135 - 145 mmol/L Final  . Potassium 07/12/2014 4.5  3.5 - 5.1 mmol/L Final  . Chloride 07/12/2014 88* 101 - 111 mmol/L Final  . CO2 07/12/2014 27  22 - 32 mmol/L Final  . Glucose, Bld 07/12/2014 262* 65 - 99 mg/dL Final  . BUN 07/12/2014 33* 6 - 20 mg/dL Final  . Creatinine, Ser 07/12/2014 2.26* 0.61 - 1.24 mg/dL Final  . Calcium 07/12/2014 8.4* 8.9 - 10.3 mg/dL Final  . GFR calc non Af Amer 07/12/2014 27* >60 mL/min Final  . GFR calc Af Amer 07/12/2014 31* >60 mL/min Final  . Anion gap 07/12/2014 8  5 - 15 Final  . Sodium 07/12/2014 124* 135 - 145 mmol/L Final  . Potassium 07/12/2014 4.6  3.5 - 5.1 mmol/L Final  . Chloride 07/12/2014 87* 101 - 111 mmol/L Final  . CO2 07/12/2014 27  22 - 32 mmol/L Final  . Glucose, Bld 07/12/2014 227* 65 - 99 mg/dL Final  . BUN 07/12/2014 31* 6 - 20 mg/dL Final  . Creatinine, Ser 07/12/2014 2.03* 0.61 - 1.24 mg/dL Final  . Calcium 07/12/2014 8.7* 8.9 - 10.3 mg/dL Final  . GFR calc non Af Amer 07/12/2014 31* >60 mL/min Final  . GFR calc Af Amer 07/12/2014 36* >60 mL/min Final  . Anion gap 07/12/2014 10  5 - 15 Final  . Sodium 07/12/2014 125* 135 - 145 mmol/L Final  . Potassium 07/12/2014 4.3  3.5 - 5.1 mmol/L Final  . Chloride 07/12/2014 87* 101 - 111 mmol/L Final  . CO2 07/12/2014 27  22 - 32 mmol/L Final  . Glucose, Bld 07/12/2014 276* 65 - 99 mg/dL Final  . BUN 07/12/2014 29* 6 - 20 mg/dL Final  . Creatinine, Ser 07/12/2014 1.85* 0.61 - 1.24 mg/dL Final  . Calcium 07/12/2014 8.7* 8.9 - 10.3 mg/dL Final  . GFR calc non Af Amer 07/12/2014 34* >60 mL/min Final  . GFR calc Af Amer 07/12/2014 40* >60 mL/min Final  . Anion gap 07/12/2014 11  5 - 15 Final  . Creatinine, Urine 07/12/2014 51.73   Final  . Urea Nitrogen, Ur 07/12/2014 248  Not Estab. mg/dL Final  . Osmolality 07/12/2014 277  275 - 300 mOsm/kg Final  . Glucose-Capillary 07/12/2014 262* 65 - 99 mg/dL Final  . Comment 1  07/12/2014 Notify RN   Final  . Comment 2 07/12/2014 Document in Chart   Final  . Glucose-Capillary 07/12/2014 201* 65 - 99 mg/dL Final  . Magnesium 07/13/2014 1.8  1.7 - 2.4 mg/dL Final  . Sodium 07/13/2014 128* 135 - 145 mmol/L Final  . Potassium 07/13/2014 4.5  3.5 - 5.1 mmol/L Final  . Chloride 07/13/2014 90* 101 - 111 mmol/L Final  . CO2 07/13/2014 27  22 - 32 mmol/L Final  . Glucose, Bld 07/13/2014 188* 65 - 99 mg/dL Final  . BUN 07/13/2014 24* 6 - 20 mg/dL Final  . Creatinine, Ser 07/13/2014 1.24  0.61 - 1.24 mg/dL Final  . Calcium 07/13/2014 8.6* 8.9 - 10.3 mg/dL Final  . Total Protein 07/13/2014 6.0* 6.5 - 8.1 g/dL Final  . Albumin 07/13/2014 2.8* 3.5 - 5.0 g/dL Final  . AST 07/13/2014 18  15 - 41 U/L Final  . ALT 07/13/2014 15* 17 - 63 U/L Final  . Alkaline Phosphatase 07/13/2014 48  38 - 126 U/L Final  . Total Bilirubin 07/13/2014 1.1  0.3 - 1.2 mg/dL Final  . GFR calc non Af Amer 07/13/2014 56* >60 mL/min Final  . GFR calc Af Amer 07/13/2014 >60  >60 mL/min Final  . Anion gap 07/13/2014 11  5 - 15 Final  . WBC 07/13/2014 9.0  4.0 - 10.5 K/uL Final  . RBC 07/13/2014 3.14* 4.22 - 5.81 MIL/uL Final  . Hemoglobin 07/13/2014 9.4* 13.0 - 17.0 g/dL Final  . HCT 07/13/2014 26.7* 39.0 - 52.0 % Final  . MCV 07/13/2014 85.0  78.0 - 100.0 fL Final  . MCH 07/13/2014 29.9  26.0 - 34.0 pg Final  . MCHC 07/13/2014 35.2  30.0 - 36.0 g/dL Final  . RDW 07/13/2014 13.8  11.5 - 15.5 % Final  . Platelets 07/13/2014 165  150 - 400 K/uL Final  . Phosphorus 07/13/2014 3.5  2.5 - 4.6 mg/dL Final  . MRSA by PCR 07/12/2014 NEGATIVE  NEGATIVE Final  . Glucose-Capillary 07/13/2014 178* 65 - 99 mg/dL Final  . Glucose-Capillary 07/13/2014 134* 65 - 99 mg/dL Final     Micro Results: Recent Results (from the past 240 hour(s))  MRSA PCR Screening     Status: None   Collection Time: 07/12/14 11:05 PM  Result Value Ref Range Status   MRSA by PCR NEGATIVE NEGATIVE Final    Comment:        The  GeneXpert MRSA Assay (FDA approved for NASAL specimens only), is one component of a comprehensive MRSA colonization surveillance program. It is not intended to diagnose MRSA infection nor to guide or monitor treatment for MRSA infections.    Studies/Results: Ct Angio Chest Pe W/cm &/or Wo Cm  07/13/2014   ADDENDUM REPORT: 07/13/2014 10:20  ADDENDUM: Critical Value/emergent results were called by telephone at the time of interpretation on 07/13/2014 at 10:20 am to Dr. Algis Liming, covering physician, who verbally acknowledged these results.   Electronically Signed   By: Lowella Grip III M.D.   On: 07/13/2014 10:20   07/13/2014   CLINICAL DATA:  Recent total knee replacement with difficulty breathing and tachycardia  EXAM: CT ANGIOGRAPHY CHEST WITH CONTRAST  TECHNIQUE: Multidetector CT imaging of the chest was performed using the standard protocol during bolus administration of intravenous contrast. Multiplanar CT image reconstructions and MIPs were obtained to evaluate the vascular anatomy.  CONTRAST:  43m OMNIPAQUE IOHEXOL 350 MG/ML SOLN  COMPARISON:  Chest radiograph July 12, 2014  FINDINGS: There is pulmonary embolism  involving multiple lower lobe branch vessels bilaterally, somewhat more severe on the right than on the left. There is also a pulmonary embolus in the posterior segment left upper lobe branch. Pulmonary embolus arises from the distal posterior most aspect of the right main pulmonary artery. The right ventricle to left ventricle diameter ratio is 0.9, borderline for right heart strain.  There is atherosclerotic change in the aorta without aneurysm or dissection.  There are multiple foci of coronary artery calcification, particularly in the left anterior descending coronary artery.  There is patchy atelectatic change in both lower lobes. A small area of infiltrate is noted in the inferior lingula.  There is no appreciable thoracic adenopathy. Thyroid appears unremarkable. The pericardium is  not thickened.  In the visualized upper abdomen, no lesions are identified.  There are no blastic or lytic bone lesions. There is degenerative change in the thoracic spine.  Review of the MIP images confirms the above findings.  IMPRESSION: Positive for acute PE with borderline CT evidence of right heart strain (RV/LV Ratio = 0.9) consistent with at least submassive (intermediate risk) PE. The presence of right heart strain has been associated with an increased risk of morbidity and mortality. Please activate Code PE by paging [phone]. 1 pulmonary emboli are primarily located in the lower lobe branches bilaterally, although there is a pulmonary embolus in a left upper lobe posterior segment branch.  Small area of infiltrate in the inferior lingula. Areas of patchy atelectasis bilaterally.  No adenopathy.  Electronically Signed: By: Lowella Grip III M.D. On: 07/13/2014 09:57   US Renal  07/12/2014   CLINICAL DATA:  Acute renal failure.  Hypertension.  Diabetes.  EXAM: RENAL / URINARY TRACT ULTRASOUND COMPLETE  COMPARISON:  07/18/2011  FINDINGS: Right Kidney:  Length: 11.9 cm. Echogenicity within normal limits. No mass or hydronephrosis visualized. 2.1 by 2.4 by 1.3 cm right mid upper kidney cyst, benign appearance.  Left Kidney:  Length: 11.5 cm. Echogenicity within normal limits. No mass or hydronephrosis visualized.  Bladder:  Foley catheter in the urinary bladder which appears otherwise unremarkable. The bladder is not drained, presumably the Foley was clamped.  IMPRESSION: 1. Right mid upper kidney cyst. Renal echogenicity and echotexture normal, without specific cause or associated feature for the patient's acute renal failure. 2. Foley catheter in the urinary bladder. The urinary bladder is not empty, presumably the Foley was clamped prior to the ultrasound.   Electronically Signed   By: Van Clines M.D.   On: 07/12/2014 16:35   Dg Chest Portable 1 View  07/12/2014   CLINICAL DATA:  Chest  tightness and shortness of breath since this morning. Recent knee replacement 1 week prior.  EXAM: PORTABLE CHEST - 1 VIEW  COMPARISON:  06/23/2014  FINDINGS: Lower lung volumes from prior exam. The heart size and mediastinal contours are unchanged allowing for differences in technique. There is linear atelectasis in the left lower lung zone. No consolidation, large pleural effusion or pneumothorax. No pulmonary edema. No acute osseous abnormalities are seen.  IMPRESSION: Linear atelectasis at the left lung base.   Electronically Signed   By: Jeb Levering M.D.   On: 07/12/2014 06:39   Medications: I have reviewed the patient's current medications. Scheduled Meds: . aspirin EC  81 mg Oral Daily  . enoxaparin (LOVENOX) injection  80 mg Subcutaneous Q12H  . feeding supplement (ENSURE ENLIVE)  237 mL Oral BID BM  . insulin aspart  0-9 Units Subcutaneous TID WC  . QUEtiapine  50 mg Oral QHS  . sodium chloride  3 mL Intravenous Q12H   Continuous Infusions: . dextrose 75 mL/hr at 07/13/14 1016   PRN Meds:.ondansetron **OR** ondansetron (ZOFRAN) IV Assessment/Plan: Active Problems:   Urinary retention   Acute renal failure   Submassive PE - Most probably provoked since his knee replacement and immobility. He was on lovenox DVT/PE ppx but still developed a sub-massive bilateral lower lobe feeding vein PE's on CT performed today. Came in with SOB, hypoxia, sweating. Ddimer elevated, initially could not do CT angio b/c of AKI but today CT angio showed submassive PE bilaterally lower lobes, R>L. CT showed possible RH strain.  - ECHO to evaluate for heart strain - we started lovenox on admission. Will continue this 13m/kg q12hr. May transition to Xarelto on discharge.   AKI - 2/2 to urinary retention - IMPROVING. During operation patient's left leg femoral vein was nicked during a nerve block, per surgeon. This lead to severe swelling in the left leg and scrotum/penis which blocked urine flow. This  lead to post renal AKI.  -came in with crt 3.28 (was 1.03 on 07/04/2014). Placed foley and drained about 2.2 L immediately. His kidney function is almost back to 1.24 crt. I expected normalizing by tomorrow. CT was performed given improved kidney function.  -Discuss with urology with Dr. OLeitha Schuller recommended leaving foley for now and discharging with foley. He recommended giving Flomax 0.428mdaily on discharge and to follow up with him in 1 week. He will re-assess for foley discontinuation.  Hyponatremia - 2/2 to urinary retention - corrected with foley cath. Initially came in 119, was normal in the past, then corrected to 128 this am.  May have overcorrected as this may have been going on 48+ hours. Goal correction should be less than 8 in 24 hours. We have started D5W 75cc/hr to prevent rapid correction of sodium. Patient is neurologically intact at this time.  - follow BMET q8hr and adjust D5w fluid rate as appropriate.  Recent knee replacement (left knee) - Dr. LuLorre Nickerformed the surgery and can be reached at [phone] questions. He was informed about the admission. He stated that intraoperatively, pt received femoral nerve block and anthesthesia nicked the femoral vein causing large scrotal and left thigh ecchymosis. - monitor hgb daily.  Hx of DM II- last hgba1c 6.4 on 06/2014, on glipizide at home - monitor CBG here.   Dispo: Disposition is deferred at this time, awaiting improvement of current medical problems. Anticipated discharge in approximately 1-2 day(s).   The patient does have a current PCP (Amy Moon, NP) and [facility].  The patient does have transportation limitations that hinder transportation to clinic appointments.    LOS: 1 day   Services Needed at time of discharge: Y = Yes, Blank = No PT:   OT:   RN:   Equipment:   Other:    JoRosina LowensteinMed Student 07/13/2014, 2:06 PM

## 2014-07-13 NOTE — Progress Notes (Signed)
SPORTS MEDICINE AND JOINT REPLACEMENT  Lara Mulch, MD   Carlynn Spry, PA-C [address], [place], [place]  [address]                             [phone]   PROGRESS NOTE  Subjective:  negative for Chest Pain  negative for Shortness of Breath  negative for Nausea/Vomiting   negative for Calf Pain  negative for Bowel Movement   Tolerating Diet: yes         Patient reports pain as 5 on 0-10 scale.    Objective: Vital signs in last 24 hours:   Patient Vitals for the past 24 hrs:  BP Temp Temp src Pulse Resp SpO2 Height Weight  07/13/14 0500 122/65 mmHg 98.1 F (36.7 C) Oral 82 - 98 % _0  (1.651 m) 77.61 kg (171 lb 1.6 oz)  07/12/14 1943 (!) 134/54 mmHg 98.3 F (36.8 C) Oral (!) 101 - 94 % - -  07/12/14 1352 - - - - - 91 % - -  07/12/14 1350 (!) 127/58 mmHg 98.3 F (36.8 C) Oral 96 20 (!) 89 % _1  (1.651 m) 76.114 kg (167 lb 12.8 oz)  07/12/14 1300 108/59 mmHg - - 87 22 (!) 89 % - -  07/12/14 1245 122/65 mmHg - - 94 13 (!) 89 % - -  07/12/14 1230 101/57 mmHg - - 86 19 94 % - -    _2 {1959:LAST@   Intake/Output from previous day:   06/01 0701 - 06/02 0700 In: -  Out: 4450 [Urine:4450]   Intake/Output this shift:       Intake/Output      06/01 0701 - 06/02 0700 06/02 0701 - 06/03 0700   Urine (mL/kg/hr) 4450 (2.4)    Total Output 4450     Net -4450             LABORATORY DATA:  Recent Labs  07/12/14 0501 07/13/14 0348  WBC 16.0* 9.0  HGB 10.1* 9.4*  HCT 28.2* 26.7*  PLT 242 165    Recent Labs  07/12/14 0501 07/12/14 0914 07/12/14 1304 07/12/14 1714 07/13/14 0348  NA 119* 123* 124* 125* 128*  K 4.1 4.5 4.6 4.3 4.5  CL 80* 88* 87* 87* 90*  CO2 _3 BUN 37* 33* 31* 29* 24*  CREATININE 3.28* 2.26* 2.03* 1.85* 1.24  GLUCOSE 276* 262* 227* 276* 188*  CALCIUM 8.6* 8.4* 8.7* 8.7* 8.6*   Lab Results  Component Value Date   INR 1.14 06/23/2014    Examination:  General appearance: alert, cooperative and no  distress Extremities: Homans sign is negative, no sign of DVT  Wound Exam: clean, dry, intact   Drainage:  None: wound tissue dry  Motor Exam: EHL and FHL Intact  Sensory Exam: Deep Peroneal normal   Assessment:          ADDITIONAL DIAGNOSIS:  Active Problems:   Urinary retention   Acute renal failure     Plan: Physical Therapy as ordered Weight Bearing as Tolerated (WBAT)  Ortho will continue to follow  Active Problems:  Urinary retention  Acute renal failure  Submassive PE - we started lovenox on admission. Will continue this 50m/kg q12hr. May transition to Xarelto on discharge.   AKI - 2/2 to urinary retention - improving   Urinary retention  Urology consulted with Dr. OLeitha Schuller recommended leaving foley for now and discharging with foley.  He recommended giving Flomax 0.38m daily on discharge and to follow up with him in 1 week. He will re-assess for foley discontinuation.            Inaara Tye 07/13/2014, 12:15 PM

## 2014-07-13 NOTE — Progress Notes (Addendum)
Subjective: CT angio showed submassive PE. Patient is doing well other than feeling SOB when moving around. No fever/chills/dizziness. Having good UOP with foley. Kidney function is better today.   Objective: Vital signs in last 24 hours: Filed Vitals:   07/12/14 1350 07/12/14 1352 07/12/14 1943 07/13/14 0500  BP: 127/58  134/54 122/65  Pulse: 96  101 82  Temp: 98.3 F (36.8 C)  98.3 F (36.8 C) 98.1 F (36.7 C)  TempSrc: Oral  Oral Oral  Resp: 20     Height: 5' 5" (1.651 m)   5' 5" (1.651 m)  Weight: 167 lb 12.8 oz (76.114 kg)   171 lb 1.6 oz (77.61 kg)  SpO2: 89% 91% 94% 98%   Weight change:   Intake/Output Summary (Last 24 hours) at 07/13/14 0932 Last data filed at 07/13/14 0547  Gross per 24 hour  Intake      0 ml  Output   4450 ml  Net  -4450 ml   Vitals reviewed. General: resting in bed, NAD HEENT: PERRL, EOMI, no scleral icterus Cardiac: tachycardic, no rubs, murmurs or gallops Pulm: clear to auscultation bilaterally, no wheezes, rales, or rhonchi Abd: soft, nontender, significantly less distended today. Has ecchymosis on his left thigh and scrotum. Scrotum is significantly less distended today.  Ext: warm and well perfused, no pedal edema Neuro: alert and oriented X3, cranial nerves II-XII grossly intact, strength and sensation to light touch equal in bilateral upper and lower extremities  Lab Results: Basic Metabolic Panel:  Recent Labs Lab 07/12/14 1714 07/13/14 0348  NA 125* 128*  K 4.3 4.5  CL 87* 90*  CO2 27 27  GLUCOSE 276* 188*  BUN 29* 24*  CREATININE 1.85* 1.24  CALCIUM 8.7* 8.6*  MG  --  1.8  PHOS  --  3.5   Liver Function Tests:  Recent Labs Lab 07/13/14 0348  AST 18  ALT 15*  ALKPHOS 48  BILITOT 1.1  PROT 6.0*  ALBUMIN 2.8*   No results for input(s): LIPASE, AMYLASE in the last 168 hours. No results for input(s): AMMONIA in the last 168 hours. CBC:  Recent Labs Lab 07/12/14 0501 07/13/14 0348  WBC 16.0* 9.0    NEUTROABS 12.9*  --   HGB 10.1* 9.4*  HCT 28.2* 26.7*  MCV 82.0 85.0  PLT 242 165  D-Dimer:  Recent Labs Lab 07/12/14 0501  DDIMER >20.00*   CBG:  Recent Labs Lab 07/12/14 1700 07/12/14 2128 07/13/14 0758  GLUCAP 262* 201* 178*   Urinalysis:  Recent Labs Lab 07/12/14 0556  COLORURINE YELLOW  LABSPEC 1.006  PHURINE 5.5  GLUCOSEU NEGATIVE  HGBUR MODERATE*  BILIRUBINUR NEGATIVE  KETONESUR NEGATIVE  PROTEINUR NEGATIVE  UROBILINOGEN 0.2  NITRITE NEGATIVE  LEUKOCYTESUR NEGATIVE   Misc. Labs:  Micro Results: Recent Results (from the past 240 hour(s))  MRSA PCR Screening     Status: None   Collection Time: 07/12/14 11:05 PM  Result Value Ref Range Status   MRSA by PCR NEGATIVE NEGATIVE Final    Comment:        The GeneXpert MRSA Assay (FDA approved for NASAL specimens only), is one component of a comprehensive MRSA colonization surveillance program. It is not intended to diagnose MRSA infection nor to guide or monitor treatment for MRSA infections.    Studies/Results: US Renal  07/12/2014   CLINICAL DATA:  Acute renal failure.  Hypertension.  Diabetes.  EXAM: RENAL / URINARY TRACT ULTRASOUND COMPLETE  COMPARISON:  07/18/2011  FINDINGS: Right  Kidney:  Length: 11.9 cm. Echogenicity within normal limits. No mass or hydronephrosis visualized. 2.1 by 2.4 by 1.3 cm right mid upper kidney cyst, benign appearance.  Left Kidney:  Length: 11.5 cm. Echogenicity within normal limits. No mass or hydronephrosis visualized.  Bladder:  Foley catheter in the urinary bladder which appears otherwise unremarkable. The bladder is not drained, presumably the Foley was clamped.  IMPRESSION: 1. Right mid upper kidney cyst. Renal echogenicity and echotexture normal, without specific cause or associated feature for the patient's acute renal failure. 2. Foley catheter in the urinary bladder. The urinary bladder is not empty, presumably the Foley was clamped prior to the ultrasound.    Electronically Signed   By: Van Clines M.D.   On: 07/12/2014 16:35   Dg Chest Portable 1 View  07/12/2014   CLINICAL DATA:  Chest tightness and shortness of breath since this morning. Recent knee replacement 1 week prior.  EXAM: PORTABLE CHEST - 1 VIEW  COMPARISON:  06/23/2014  FINDINGS: Lower lung volumes from prior exam. The heart size and mediastinal contours are unchanged allowing for differences in technique. There is linear atelectasis in the left lower lung zone. No consolidation, large pleural effusion or pneumothorax. No pulmonary edema. No acute osseous abnormalities are seen.  IMPRESSION: Linear atelectasis at the left lung base.   Electronically Signed   By: Jeb Levering M.D.   On: 07/12/2014 06:39   Medications: I have reviewed the patient's current medications. Scheduled Meds: . aspirin EC  81 mg Oral Daily  . enoxaparin (LOVENOX) injection  80 mg Subcutaneous Q12H  . feeding supplement (ENSURE ENLIVE)  237 mL Oral BID BM  . insulin aspart  0-9 Units Subcutaneous TID WC  . QUEtiapine  50 mg Oral QHS  . sodium chloride  3 mL Intravenous Q12H   Continuous Infusions: . dextrose     PRN Meds:.ondansetron **OR** ondansetron (ZOFRAN) IV Assessment/Plan: Active Problems:   Urinary retention   Acute renal failure  Submassive PE - likely provoked given recent knee surgery.  Came in with SOB, hypoxia, sweating. Ddimer elevated, initially could not do CT angio b/c of AKI but today CT angio showed submassive PE bilaterally lower lobes, R>L. CT showed possible RH strain. Patient is not having dizziness, vitals are stable. Will not call PCCM yet since he is not unstable. - ECHO to evaluate for heart strain - we started lovenox on admission. Will continue this 3m/kg q12hr. May transition to Xarelto on discharge.   AKI - 2/2 to urinary retention - improving -came in with crt 3.28 (was 1.03 on 07/04/2014). Placed foley and drained about 2.2 L immediately. His kidney function is  almost back to 1.24 crt. I expected normalizing by tomorrow.  Hyponatremia - 2/2 to urinary retention - corrected with foley cath. Initially came in 119, was normal in the past, then corrected to 128 this am.  May have overcorrected as this may have been going on 48+ hours. Goal correction should be less than 8 in 24 hours. We have started D5W 75cc/hr to prevent rapid correction of sodium. - follow BMET q8hr and adjust D5w fluid rate as appropriate.  Urinary retention -likely 2/2 to the combination of several factors including femoral nerve block, femoral vein nick causing edema, pain, and pain meds. Given the fact that he drained about 2.2L after placing the foley, he may not be able to void on his own for now. Discuss with urology with Dr. OLeitha Schuller recommended leaving foley for now and discharging  with foley. He recommended giving Flomax 0.69m daily on discharge and to follow up with him in 1 week. He will re-assess for foley discontinuation.  Recent knee replacement (left knee) - Dr. LLorre Nickperformed the surgery and can be reached at [phone] questions. He was informed about the admission. He stated that intraoperatively, pt received femoral nerve block and anthesthesia nicked the femoral vein causing large scrotal and left thigh ecchymosis. - monitor hgb daily.  Hx of DM II- last hgba1c 6.4 on 06/2014, on glipizide at home - monitor CBG here.   Dispo: Disposition is deferred at this time, awaiting improvement of current medical problems.  Anticipated discharge in approximately 1-2 day(s).   The patient does have a current PCP (Amy Moon, NP) [facility].  The patient does have transportation limitations that hinder transportation to clinic appointments.  .Services Needed at time of discharge: Y = Yes, Blank = No PT:   OT:   RN:   Equipment:   Other:     LOS: 1 day   TDellia Nims MD 07/13/2014, 9:32 AM

## 2014-07-13 NOTE — Progress Notes (Signed)
  Echocardiogram 2D Echocardiogram has been performed.  Diamond Nickel 07/13/2014, 12:21 PM

## 2014-07-13 NOTE — Progress Notes (Signed)
Patient seen and examined. Case d/w residents in detail.  In brief, patient is a 74 y/o male with PMh of DM, CAD s/p recent L TKR (5/23) who p/w acute abd pain and poor urine output. Wife states that urination has been an issue since the procedure and since discharge he has had decreased urination and increasing abd pain from his lower abd to his penis. He has been taking lovenox injections at home as instructed since the procedure. Yesterday patient noted CP, SOB and lightheadedness while ambulatingto thebathroom and came to ED for work up.  Physical Exam: Gen: AAO*3, NAD CVS: tachycardic Lungs: CTA b/l Abd: soft, non tender, BS + Ext: Ecchymosis + over left thigh and scrotum  Assessment and Plan: 74 y/o male with abd pain, CP and hypoxia found to have acute urinary retention with AKI and sub massive PE  Submassive PE: - CT angio noted- b/l lower lobe PE + with mild right heart strain - Will f/u 2 D ECHO - C/w therapeutic lovenox - Will consider PCCM consult if patient worsens  AKI: - now resolving - Will need to keep Foley in and start flomax - Outpatient f/u with urology. Resident discussed with urology  Hyponatremia: - Na slowly correcting. Has corrected by 11 in 36 hours. - Will monitor BMET q 8 hours and continue with D5W for now

## 2014-07-14 LAB — CBC
HEMATOCRIT: 26.3 % — AB (ref 39.0–52.0)
Hemoglobin: 9 g/dL — ABNORMAL LOW (ref 13.0–17.0)
MCH: 28.9 pg (ref 26.0–34.0)
MCHC: 34.2 g/dL (ref 30.0–36.0)
MCV: 84.6 fL (ref 78.0–100.0)
Platelets: 190 10*3/uL (ref 150–400)
RBC: 3.11 MIL/uL — AB (ref 4.22–5.81)
RDW: 13.6 % (ref 11.5–15.5)
WBC: 8.2 10*3/uL (ref 4.0–10.5)

## 2014-07-14 LAB — BASIC METABOLIC PANEL
Anion gap: 8 (ref 5–15)
BUN: 12 mg/dL (ref 6–20)
CO2: 27 mmol/L (ref 22–32)
CREATININE: 0.77 mg/dL (ref 0.61–1.24)
Calcium: 8.4 mg/dL — ABNORMAL LOW (ref 8.9–10.3)
Chloride: 93 mmol/L — ABNORMAL LOW (ref 101–111)
Glucose, Bld: 281 mg/dL — ABNORMAL HIGH (ref 65–99)
Potassium: 4.1 mmol/L (ref 3.5–5.1)
SODIUM: 128 mmol/L — AB (ref 135–145)

## 2014-07-14 LAB — GLUCOSE, CAPILLARY
Glucose-Capillary: 299 mg/dL — ABNORMAL HIGH (ref 65–99)
Glucose-Capillary: 147 mg/dL — ABNORMAL HIGH (ref 65–99)

## 2014-07-14 MED ORDER — TAMSULOSIN HCL 0.4 MG PO CAPS: 0.4000 mg | ORAL_CAPSULE | Freq: Every day | ORAL | Status: DC

## 2014-07-14 MED ORDER — RIVAROXABAN 15 MG PO TABS
15.0000 mg | ORAL_TABLET | Freq: Two times a day (BID) | ORAL | Status: DC
  Administered 2014-07-14 (×2): 15 mg via ORAL
  Filled 2014-07-14 (×2): qty 1

## 2014-07-14 MED ORDER — RIVAROXABAN 20 MG PO TABS: 20.0000 mg | ORAL_TABLET | Freq: Every day | ORAL | Status: DC

## 2014-07-14 MED ORDER — RIVAROXABAN 15 MG PO TABS: 15.0000 mg | ORAL_TABLET | Freq: Two times a day (BID) | ORAL | Status: DC

## 2014-07-14 MED ORDER — ASPIRIN 81 MG PO TBEC: 81.0000 mg | DELAYED_RELEASE_TABLET | Freq: Every day | ORAL | Status: DC

## 2014-07-14 NOTE — Progress Notes (Signed)
Patient seen and examined. Case d/w residents in detail. I agree with findings and plan as documented in Dr. Brandt Loosen note  Patient is improved today. He is able to ambulate with PT with O2 sats around 90% after ambulation. Patient is stable for d/c home today on xarelto. Will need 3-6 months of therapeutic a/c and then can reassess.   Will maintain Foley for urinary retention. Patient to f/u with Uro as an outpatient

## 2014-07-14 NOTE — Progress Notes (Signed)
SATURATION QUALIFICATIONS: (This note is used to comply with regulatory documentation for home oxygen)  Patient Saturations on Room Air at Rest = 96  Patient Saturations on Room Air while Ambulating = 94

## 2014-07-14 NOTE — Discharge Instructions (Addendum)
Please start taking Flomax 0.2m daily until you follow up with your urologist. Continue the foley cathether as well.  Start taking Xarelto 147mtwice a day until 08/03/14, then take 2041maily.  See your doctors as instructed.  Call or come back to the ED if you start noticing shortness of breathe, chest pain, dizziness, or other symptoms.  Information on my medicine - XARELTO (rivaroxaban)  This medication education was reviewed with me or my healthcare representative as part of my discharge preparation.  The pharmacist that spoke with me during my hospital stay was:  WilGeorgina PeerPHShirleys[place] YOU? Xarelto was prescribed to treat blood clots that may have been found in the veins of your legs (deep vein thrombosis) or in your lungs (pulmonary embolism) and to reduce the risk of them occurring again.  What do you need to know about Xarelto? The starting dose is one 15 mg tablet taken TWICE daily with food for the FIRST 21 DAYS then on (enter date) 6/24  the dose is changed to one 20 mg tablet taken ONCE A DAY with your evening meal.  DO NOT stop taking Xarelto without talking to the health care provider who prescribed the medication.  Refill your prescription for 20 mg tablets before you run out.  After discharge, you should have regular check-up appointments with your healthcare provider that is prescribing your Xarelto.  In the future your dose may need to be changed if your kidney function changes by a significant amount.  What do you do if you miss a dose? If you are taking Xarelto TWICE DAILY and you miss a dose, take it as soon as you remember. You may take two 15 mg tablets (total 30 mg) at the same time then resume your regularly scheduled 15 mg twice daily the next day.  If you are taking Xarelto ONCE DAILY and you miss a dose, take it as soon as you remember on the same day then continue your regularly scheduled once daily regimen the next day.  Do not take two doses of Xarelto at the same time.   Important Safety Information Xarelto is a blood thinner medicine that can cause bleeding. You should call your healthcare provider right away if you experience any of the following: ? Bleeding from an injury or your nose that does not stop. ? Unusual colored urine (red or dark brown) or unusual colored stools (red or black). ? Unusual bruising for unknown reasons. ? A serious fall or if you hit your head (even if there is no bleeding).  Some medicines may interact with Xarelto and might increase your risk of bleeding while on Xarelto. To help avoid this, consult your healthcare provider or pharmacist prior to using any new prescription or non-prescription medications, including herbals, vitamins, non-steroidal anti-inflammatory drugs (NSAIDs) and supplements.  This website has more information on Xarelto: [email or web address]

## 2014-07-14 NOTE — Discharge Summary (Signed)
Name: Christian Patton MRN: [id number] DOB: July 17, 1940 74 y.o. PCP: Laverna Peace, NP  Date of Admission: 07/12/2014  4:43 AM Date of Discharge: 07/14/2014 Attending Physician: Aldine Contes, MD  Discharge Diagnosis: Submassive PE Hyponatremia Active Problems:   Urinary retention   Acute renal failure  Discharge Medications:   Medication List    STOP taking these medications        enoxaparin 40 MG/0.4ML injection  Commonly known as:  LOVENOX     verapamil 120 MG tablet  Commonly known as:  CALAN      TAKE these medications        CALCIUM PO  Take 800 mg by mouth daily.     hydrochlorothiazide 25 MG tablet  Commonly known as:  HYDRODIURIL  Take 12.5-25 mg by mouth daily.     MAGNESIUM GLYCINATE PLUS PO  Take 1 tablet by mouth daily.     metoprolol succinate 25 MG 24 hr tablet  Commonly known as:  TOPROL-XL  Take 25 mg by mouth daily.     multivitamin tablet  Take 1 tablet by mouth daily.     oxyCODONE 5 MG immediate release tablet  Commonly known as:  Oxy IR/ROXICODONE  Take 1-2 tablets (5-10 mg total) by mouth every 4 (four) hours as needed for breakthrough pain.     OxyCODONE 10 mg T12a 12 hr tablet  Commonly known as:  OXYCONTIN  Take 1 tablet (10 mg total) by mouth every 12 (twelve) hours.     pantoprazole 40 MG tablet  Commonly known as:  PROTONIX  Take 40 mg by mouth daily as needed (indigestion).     QUEtiapine 25 MG tablet  Commonly known as:  SEROQUEL  Take 25-50 mg by mouth at bedtime.     Rivaroxaban 15 MG Tabs tablet  Commonly known as:  XARELTO  Take 1 tablet (15 mg total) by mouth 2 (two) times daily with a meal.     rivaroxaban 20 MG Tabs tablet  Commonly known as:  XARELTO  Take 1 tablet (20 mg total) by mouth daily with supper.  Start taking on:  08/04/2014     rosuvastatin 5 MG tablet  Commonly known as:  CRESTOR  Take 5 mg by mouth daily.     tamsulosin 0.4 MG Caps capsule  Commonly known as:  FLOMAX  Take 1 capsule (0.4 mg  total) by mouth daily.        Disposition and follow-up:   Mr.Christian Patton was discharged from [facility] in Stable condition.  At the hospital follow up visit please address:  1.  Please assess his urinary retention (need for foley).  Please assess his resp function given he had a submassive PE.  Please assess whether he will need to be on xarelto for life time or just for 6 months. This is likely a provoke episode. May consider coag panel outpatient.  Please assess whether metformin is a better choice than glipizide for his DM II. Please assess his BP, he did not require any anti-hypertensives in the hospital.  Please assess his ecchymoses on leg/scrotum. Follow his kidney function/sodium and Hgb  2.  Labs / imaging needed at time of follow-up: BMEt, CBC  3.  Pending labs/ test needing follow-up:   Follow-up Appointments:     Follow-up Information    Follow up with [facility], NP.   Specialty:  Internal Medicine   Contact information:   [place], [address] [place]  [place] [phone]       Follow up with Rudean Haskell, MD.   Specialty:  Orthopedic Surgery   Contact information:   [address] [place] [address] [phone]       Schedule an appointment as soon as possible for a visit with Dr. Emi Holes Prithvipal.   Contact information:   Dr. Jamse Arn, [address] [place], [place] [address]      Discharge Instructions:   Consultations:    Procedures Performed:  Dg Chest 2 View  06/23/2014   CLINICAL DATA:  Pre-Op LEFT TOTAL KNEE ARTHOPLASTY 07/03/14. No chest complaints. Pt has hx of CAD, diabetes, GERD, Hep C. Past-Smoker @ 1ppd x 50 yrs.  EXAM: CHEST  2 VIEW  COMPARISON:  None.  FINDINGS: Cardiac silhouette normal in size and configuration. Left coronary artery stent noted. Aorta is mildly uncoiled. No mediastinal or hilar masses or evidence of adenopathy.  Chronic bronchitic change noted in the lower lobes.  Area of reticular scarring noted in the peripheral right upper lobe. No lung consolidation or edema. No pleural effusion or pneumothorax.  Bony thorax is demineralized but intact.  IMPRESSION: No acute cardiopulmonary disease.   Electronically Signed   By: Lajean Manes M.D.   On: 06/23/2014 17:00   Ct Angio Chest Pe W/cm &/or Wo Cm  07/13/2014   ADDENDUM REPORT: 07/13/2014 10:20  ADDENDUM: Critical Value/emergent results were called by telephone at the time of interpretation on 07/13/2014 at 10:20 am to Dr. Algis Liming, covering physician, who verbally acknowledged these results.   Electronically Signed   By: Lowella Grip III M.D.   On: 07/13/2014 10:20   07/13/2014   CLINICAL DATA:  Recent total knee replacement with difficulty breathing and tachycardia  EXAM: CT ANGIOGRAPHY CHEST WITH CONTRAST  TECHNIQUE: Multidetector CT imaging of the chest was performed using the standard protocol during bolus administration of intravenous contrast. Multiplanar CT image reconstructions and MIPs were obtained to evaluate the vascular anatomy.  CONTRAST:  28m OMNIPAQUE IOHEXOL 350 MG/ML SOLN  COMPARISON:  Chest radiograph July 12, 2014  FINDINGS: There is pulmonary embolism involving multiple lower lobe branch vessels bilaterally, somewhat more severe on the right than on the left. There is also a pulmonary embolus in the posterior segment left upper lobe branch. Pulmonary embolus arises from the distal posterior most aspect of the right main pulmonary artery. The right ventricle to left ventricle diameter ratio is 0.9, borderline for right heart strain.  There is atherosclerotic change in the aorta without aneurysm or dissection.  There are multiple foci of coronary artery calcification, particularly in the left anterior descending coronary artery.  There is patchy atelectatic change in both lower lobes. A small area of infiltrate is noted in the inferior lingula.  There is no appreciable thoracic adenopathy. Thyroid appears  unremarkable. The pericardium is not thickened.  In the visualized upper abdomen, no lesions are identified.  There are no blastic or lytic bone lesions. There is degenerative change in the thoracic spine.  Review of the MIP images confirms the above findings.  IMPRESSION: Positive for acute PE with borderline CT evidence of right heart strain (RV/LV Ratio = 0.9) consistent with at least submassive (intermediate risk) PE. The presence of right heart strain has been associated with an increased risk of morbidity and mortality. Please activate Code PE by paging [phone] 1 pulmonary emboli are primarily located in the lower lobe branches bilaterally, although there is a pulmonary embolus in a left upper lobe posterior  segment branch.  Small area of infiltrate in the inferior lingula. Areas of patchy atelectasis bilaterally.  No adenopathy.  Electronically Signed: By: Lowella Grip III M.D. On: 07/13/2014 09:57   US Renal  07/12/2014   CLINICAL DATA:  Acute renal failure.  Hypertension.  Diabetes.  EXAM: RENAL / URINARY TRACT ULTRASOUND COMPLETE  COMPARISON:  07/18/2011  FINDINGS: Right Kidney:  Length: 11.9 cm. Echogenicity within normal limits. No mass or hydronephrosis visualized. 2.1 by 2.4 by 1.3 cm right mid upper kidney cyst, benign appearance.  Left Kidney:  Length: 11.5 cm. Echogenicity within normal limits. No mass or hydronephrosis visualized.  Bladder:  Foley catheter in the urinary bladder which appears otherwise unremarkable. The bladder is not drained, presumably the Foley was clamped.  IMPRESSION: 1. Right mid upper kidney cyst. Renal echogenicity and echotexture normal, without specific cause or associated feature for the patient's acute renal failure. 2. Foley catheter in the urinary bladder. The urinary bladder is not empty, presumably the Foley was clamped prior to the ultrasound.   Electronically Signed   By: Van Clines M.D.   On: 07/12/2014 16:35   Dg Chest Portable 1  View  07/12/2014   CLINICAL DATA:  Chest tightness and shortness of breath since this morning. Recent knee replacement 1 week prior.  EXAM: PORTABLE CHEST - 1 VIEW  COMPARISON:  06/23/2014  FINDINGS: Lower lung volumes from prior exam. The heart size and mediastinal contours are unchanged allowing for differences in technique. There is linear atelectasis in the left lower lung zone. No consolidation, large pleural effusion or pneumothorax. No pulmonary edema. No acute osseous abnormalities are seen.  IMPRESSION: Linear atelectasis at the left lung base.   Electronically Signed   By: Jeb Levering M.D.   On: 07/12/2014 06:39    2D Echo:   Study Conclusions  - Left ventricle: The cavity size was normal. Wall thickness was increased in a pattern of moderate LVH. There was moderate focal basal hypertrophy. Systolic function was vigorous. The estimated ejection fraction was in the range of 65% to 70%. There was dynamic obstruction at restin the outflow tract, with a peak velocity of 406 cm/sec and a peak gradient of 66 mm Hg. Wall motion was normal; there were no regional wall motion abnormalities. Doppler parameters are consistent with abnormal left ventricular relaxation (grade 1 diastolic dysfunction). - Mitral valve: Severely calcified annulus. Moderately calcified leaflets . Mobility was mildly restricted. There was mild regurgitation. - Left atrium: The atrium was severely dilated.    Admission HPI:   Mr. Spratlin 74 yo man pmh DM, CAD, recent left knee arthroplasty 07/03/14 presents with acute worsening abdominal pain. Pt along with his wife give the history. Pt states that even before discharge from the hospital post-op he was having some problem initiating urination. But had a "small trickle" on the day of discharge. He continued to have trouble initiating urination and some increasing abdominal pain that he describes as lower from his penis to his lower abdomen. He  then began having some nausea and significant anorexia. The patient has noticed some weight gain and LE edema. He also had swelling of his scrotum and penis but that is improving. He has been ambulating no significant pain in the knee and his wife has been giving him the lovenox shots and opiates (scheduled). This AM the pt went to go to the bathroom and then felt clammy and had central chest pain and dyspnea before calling EMS. Pt denied any HA, fevers/chills,  vomiting, areas of bleeding, syncope, or hematuria.   Hospital Course by problem list:  Submassive PE - likely provoked given recent knee surgery.  Came in with SOB, hypoxia, sweating. Ddimer elevated, initially could not do CT angio b/c of AKI but today CT angio showed submassive PE bilaterally lower lobes, R>L. CT showed possible RH strain. ECHO does not indicate RH strain. Patient is not having dizziness, vitals are stable. Will not call PCCM yet since he is not unstable. Was hypoxic initially but ambulated with PT and sat was 90%+ during ambulation on room air.  - we started lovenox on admission. Will transition to [place].  - Discussed with Dr. Lamonte Sakai (pulmonology) and he had no further rec other than continuing anticoag.  - reassess after 6 months. Will leave this upto PCP outpatient. May need coag panel.  AKI - 2/2 to urinary retention - resolved -came in with crt 3.28 (was 1.03 on 07/04/2014). Placed foley and drained about 2.2 L immediately. His kidney function is almost back to normal Crt 0.77 today.   Hyponatremia - 2/2 to urinary retention - corrected with foley cath. Initially came in 119, was normal in the past, then corrected to 128 yesterday May have overcorrected as this may have been going on 48+ hours. Goal correction should be less than 8 in 24 hours. We have started D5W 75cc/hr to prevent rapid correction of sodium. Today sodium 128, which is around goal. Will stop the fluid. - follow BMET outpatient.  Urinary  retention -likely 2/2 to the combination of several factors including femoral nerve block, femoral vein nick causing edema, pain, and pain meds. Given the fact that he drained about 2.2L after placing the foley, he may not be able to void on his own for now. Discussed with urology with Dr. Leitha Schuller, recommended leaving foley for now and discharging with foley. He recommended giving Flomax 0.54m daily on discharge and to follow up with him in 1 week. Wife stated she has her own urologist and will take him there.  Recent knee replacement (left knee) - Dr. LLorre Nickperformed the surgery and can be reached at [phone] questions. He was informed about the admission. He stated that intraoperatively, pt received femoral nerve block and anthesthesia nicked the femoral vein causing large scrotal and left thigh ecchymosis. - hgb down to 9.0. (was 11.4 on 07/04/2014). This is likely 2/2 to his hematoma from surgery. - follow up CBC outpatient.  Hx of DM II- last hgba1c 6.4 on 06/2014, on glipizide at home I would prefer metformin over glipizide but will leave this upto PCP outpatient. - monitored CBG here on SSI.  Hx of HTN - we did not give him any anithypertensives here. Resume his metoprolol and HCTZ on discharge. Dc/ed verapamil.   CAD - cont asa.  Discharge Vitals:   BP 136/65 mmHg  Pulse 94  Temp(Src) 97.9 F (36.6 C) (Oral)  Resp 20  Ht _0  (1.651 m)  Wt 170 lb (77.111 kg)  BMI 28.29 kg/m2  SpO2 94%  Discharge Labs:  Results for orders placed or performed during the hospital encounter of 07/12/14 (from the past 24 hour(s))  Basic metabolic panel     Status: Abnormal   Collection Time: 07/13/14  3:14 PM  Result Value Ref Range   Sodium 130 (L) 135 - 145 mmol/L   Potassium 4.8 3.5 - 5.1 mmol/L   Chloride 93 (L) 101 - 111 mmol/L   CO2 28 22 - 32 mmol/L   Glucose,  Bld 201 (H) 65 - 99 mg/dL   BUN 18 6 - 20 mg/dL   Creatinine, Ser 0.96 0.61 - 1.24 mg/dL   Calcium 8.7 (L) 8.9 - 10.3 mg/dL    GFR calc non Af Amer >60 >60 mL/min   GFR calc Af Amer >60 >60 mL/min   Anion gap 9 5 - 15  Glucose, capillary     Status: Abnormal   Collection Time: 07/13/14  4:13 PM  Result Value Ref Range   Glucose-Capillary 196 (H) 65 - 99 mg/dL  Glucose, capillary     Status: Abnormal   Collection Time: 07/13/14  8:56 PM  Result Value Ref Range   Glucose-Capillary 277 (H) 65 - 99 mg/dL  Basic metabolic panel     Status: Abnormal   Collection Time: 07/14/14  4:00 AM  Result Value Ref Range   Sodium 128 (L) 135 - 145 mmol/L   Potassium 4.1 3.5 - 5.1 mmol/L   Chloride 93 (L) 101 - 111 mmol/L   CO2 27 22 - 32 mmol/L   Glucose, Bld 281 (H) 65 - 99 mg/dL   BUN 12 6 - 20 mg/dL   Creatinine, Ser 0.77 0.61 - 1.24 mg/dL   Calcium 8.4 (L) 8.9 - 10.3 mg/dL   GFR calc non Af Amer >60 >60 mL/min   GFR calc Af Amer >60 >60 mL/min   Anion gap 8 5 - 15  CBC     Status: Abnormal   Collection Time: 07/14/14  4:00 AM  Result Value Ref Range   WBC 8.2 4.0 - 10.5 K/uL   RBC 3.11 (L) 4.22 - 5.81 MIL/uL   Hemoglobin 9.0 (L) 13.0 - 17.0 g/dL   HCT 26.3 (L) 39.0 - 52.0 %   MCV 84.6 78.0 - 100.0 fL   MCH 28.9 26.0 - 34.0 pg   MCHC 34.2 30.0 - 36.0 g/dL   RDW 13.6 11.5 - 15.5 %   Platelets 190 150 - 400 K/uL  Glucose, capillary     Status: Abnormal   Collection Time: 07/14/14  7:27 AM  Result Value Ref Range   Glucose-Capillary 299 (H) 65 - 99 mg/dL  Glucose, capillary     Status: Abnormal   Collection Time: 07/14/14 11:32 AM  Result Value Ref Range   Glucose-Capillary 147 (H) 65 - 99 mg/dL    Signed: Dellia Nims, MD 07/14/2014, 1:01 PM    Services Ordered on Discharge:  Equipment Ordered on Discharge:

## 2014-07-14 NOTE — Progress Notes (Signed)
ANTICOAGULATION CONSULT NOTE - Follow-up Consult  Pharmacy Consult for Lovenox -> Xarelto Indication: PE  Allergies  Allergen Reactions  . Methocarbamol Other (See Comments)    Spouse states pt "acted crazy" after taking     Patient Measurements: Height: _0  (165.1 cm) Weight: 170 lb (77.111 kg) IBW/kg (Calculated) : 61.5  Vital Signs: Temp: 97.9 F (36.6 C) (06/03 0500) Temp Source: Oral (06/03 0500) BP: 136/65 mmHg (06/03 0500) Pulse Rate: 70 (06/03 0500)  Labs:  Recent Labs  07/12/14 0501  07/13/14 0348 07/13/14 1514 07/14/14 0400  HGB 10.1*  --  9.4*  --  9.0*  HCT 28.2*  --  26.7*  --  26.3*  PLT 242  --  165  --  190  CREATININE 3.28*  < > 1.24 0.96 0.77  < > = values in this interval not displayed.  Estimated Creatinine Clearance: 78.7 mL/min (by C-G formula based on Cr of 0.77).   Medical History: Past Medical History  Diagnosis Date  . Coronary artery disease   . 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 Hepatitis     C       "Has taken Harvoni"  . Arthritis   . Hypertension   . Diabetes mellitus without complication     TYPE 2  . Urine retention 07/12/2014    Assessment: 74 y.o. male on Lovenox for PE. Now changing to Xarelto. SCr down to 0.77, est CrCl 80 ml/min. Hgb remains low but stable. No bleeding noted.  Goal of Therapy:  Full anticoagulation for Lovenox Monitor platelets by anticoagulation protocol: Yes   Plan:  D/c Lovenox Xarelto 33m po BIDWC x 21 days then 282mdaily with supper thereafter Will continue to f/u renal function and s/s bleeding  CaSherlon HandingPharmD, BCPS Clinical pharmacist, pager [phone] 5998/04/2014,7:44 AM

## 2014-07-14 NOTE — Care Management Note (Addendum)
Case Management Note  Patient Details  Name: Christian Patton MRN: [id number] Date of Birth: 06-04-1940  Subjective/Objective:   Pt admitted for Abdominal pain and SOB. Positive for PE.                  Action/Plan: Benefits check in process for Xarelto. Pt uses [employer] [address] in [place]. CM did call and medication is available.            Per rep at [employer]:  Xarelto:Tier 2; patient will pay 30% of the total cost of the medication  No auth required      CM will check to see if pt was active with [facility] services. Will continue to f/u.   CM did call [facility] to make them aware that pt will d/c today, orders in system and SOC to begin within 24-48 hrs post d/c. No further needs from CM at this time.   Expected Discharge Date:                  Expected Discharge Plan:  [place]  In-House Referral:     Discharge planning Services  CM Consult, Medication Assistance  Post Acute Care Choice:    Choice offered to:     DME Arranged:    DME Agency:     HH Arranged:    Physical Therapy HH Agency:   [place]  Status of Service:  In process, will continue to follow  Medicare Important Message Given:  Yes Date Medicare IM Given:  07/14/14 Medicare IM give by:  Jacqlyn Krauss, RN,BSN  Date Additional Medicare IM Given:    Additional Medicare Important Message give by:     If discussed at [place] of Stay Meetings, dates discussed:    Additional Comments:  Bethena Roys, RN 07/14/2014, 10:46 AM

## 2014-07-14 NOTE — Evaluation (Signed)
Physical Therapy Evaluation Patient Details Name: Christian Patton MRN: [id number] DOB: 10-23-1940 Today's Date: 07/14/2014   History of Present Illness  Patient is a 74 y/o male admitted with urinary retention and submassive PE.  Recent TKA 07/03/14.  PMH positive for CAD, DM, Hepatitis and arthritis.  Clinical Impression  Patient presents with decreased mobility due to deficits listed in PT problem list.  He will benefit from skilled PT in the acute setting to allow return home with HHPT to resume at discharge.  He demonstrates significant stiffness in the knee and will benefit from frequent walks with nursing and self AROM.    Follow Up Recommendations Home health PT;Supervision - Intermittent    Equipment Recommendations  None recommended by PT    Recommendations for Other Services       Precautions / Restrictions Precautions Precautions: Fall;Knee Restrictions Weight Bearing Restrictions: Yes LLE Weight Bearing: Weight bearing as tolerated      Mobility  Bed Mobility Overal bed mobility: Modified Independent Bed Mobility: Supine to Sit     Supine to sit: Modified independent (Device/Increase time)        Transfers Overall transfer level: Needs assistance Equipment used: Rolling walker (2 wheeled)   Sit to Stand: Supervision         General transfer comment: increased time, UE reliance  Ambulation/Gait Ambulation/Gait assistance: Supervision Ambulation Distance (Feet): 450 Feet Assistive device: Rolling walker (2 wheeled)       General Gait Details: slow pace with decreased stride length; trying to hit heel first; SpO2 nadir with ambulation about 90% on room air  Stairs            Wheelchair Mobility    Modified Rankin (Stroke Patients Only)       Balance Overall balance assessment: Needs assistance         Standing balance support: No upper extremity supported Standing balance-Leahy Scale: Fair Standing balance comment: static standing  without UE support, needs walker for ambulation                             Pertinent Vitals/Pain Pain Assessment: 0-10 Pain Score: 2  Pain Location: left knee and generalized stiffiness Pain Descriptors / Indicators: Tightness Pain Intervention(s): Monitored during session;Patient requesting pain meds-RN notified;RN gave pain meds during session    Home Living Family/patient expects to be discharged to:: Private residence Living Arrangements: Spouse/significant other Available Help at Discharge: Family;Available 24 hours/day Type of Home: House Home Access: Stairs to enter Entrance Stairs-Rails: Right Entrance Stairs-Number of Steps: 3 Home Layout: One level Home Equipment: Walker - 2 wheels;Cane - single point;Bedside commode      Prior Function Level of Independence: Needs assistance   Gait / Transfers Assistance Needed: reports was walking a lot with walker at home   ADL's / Homemaking Assistance Needed: Had not showered yet, wife assisting with ADL's        Hand Dominance        Extremity/Trunk Assessment               Lower Extremity Assessment: LLE deficits/detail   LLE Deficits / Details: AAROM left knee flexion approx 60-65 degrees with c/o tightness at top of thigh, strength at least 3/5     Communication   Communication: HOH  Cognition Arousal/Alertness: Awake/alert Behavior During Therapy: WFL for tasks assessed/performed Overall Cognitive Status: Within Functional Limits for tasks assessed  General Comments General comments (skin integrity, edema, etc.): encouraged ambulation again later with nursing assist and seated heel slides for left LE AROM    Exercises Total Joint Exercises Ankle Circles/Pumps: AROM;5 reps;10 reps;Supine Short Arc Quad: AROM;10 reps;Supine;Left (w/5 sec hold) Heel Slides: AAROM;Left;Supine;15 reps Straight Leg Raises: AROM;Left;5 reps;Supine Goniometric ROM: grossly to 60-65  AAROM      Assessment/Plan    PT Assessment Patient needs continued PT services  PT Diagnosis Difficulty walking;Generalized weakness   PT Problem List Decreased strength;Decreased mobility;Decreased activity tolerance;Pain;Decreased balance  PT Treatment Interventions DME instruction;Therapeutic exercise;Gait training;Stair training;Functional mobility training;Therapeutic activities;Patient/family education;Balance training   PT Goals (Current goals can be found in the Care Plan section) Acute Rehab PT Goals Patient Stated Goal: to resume therapy at home PT Goal Formulation: With patient Time For Goal Achievement: 07/21/14 Potential to Achieve Goals: Good    Frequency Min 3X/week   Barriers to discharge        Co-evaluation               End of Session Equipment Utilized During Treatment: Gait belt Activity Tolerance: Patient tolerated treatment well Patient left: in chair;with call bell/phone within reach      Functional Assessment Tool Used: Clinical judgement Functional Limitation: Mobility: Walking and moving around Mobility: Walking and Moving Around Current Status [phone]): At least 20 percent but less than 40 percent impaired, limited or restricted Mobility: Walking and Moving Around Goal Status [phone]): At least 1 percent but less than 20 percent impaired, limited or restricted    Time: 1025-1100 PT Time Calculation (min) (ACUTE ONLY): 35 min   Charges:   PT Evaluation $Initial PT Evaluation Tier I: 1 Procedure PT Treatments $Gait Training: 8-22 mins   PT G Codes:   PT G-Codes **NOT FOR INPATIENT CLASS** Functional Assessment Tool Used: Clinical judgement Functional Limitation: Mobility: Walking and moving around Mobility: Walking and Moving Around Current Status (G0888): At least 20 percent but less than 40 percent impaired, limited or restricted Mobility: Walking and Moving Around Goal Status [phone]): At least 1 percent but less than 20 percent  impaired, limited or restricted    [facility] 07/14/2014, 11:32 AM  Magda Kiel, [place] 07/14/2014

## 2014-07-14 NOTE — Progress Notes (Addendum)
Subjective: CT angio showed submassive PE. ECHO shows possible heart strain however patient's vitals are stable.  No fever/chills/dizziness. Having good UOP with foley. Kidney function is better today.   Objective: Vital signs in last 24 hours: Filed Vitals:   07/13/14 1355 07/13/14 1620 07/13/14 2020 07/14/14 0500  BP: 141/59 134/67 146/70 136/65  Pulse: 88 88 90 70  Temp: 99.2 F (37.3 C) 99.5 F (37.5 C) 99.3 F (37.4 C) 97.9 F (36.6 C)  TempSrc: Oral Oral Oral Oral  Resp:      Height:    _0  (1.651 m)  Weight:    170 lb (77.111 kg)  SpO2: 100% 96% 100% 100%   Weight change: 2 lb 3.2 oz (0.998 kg)  Intake/Output Summary (Last 24 hours) at 07/14/14 0729 Last data filed at 07/14/14 0600  Gross per 24 hour  Intake   2789 ml  Output   1950 ml  Net    839 ml   Vitals reviewed. General: resting in bed, NAD HEENT: PERRL, EOMI, no scleral icterus Cardiac: RRR, no rubs, murmurs or gallops Pulm: clear to auscultation bilaterally, no wheezes, rales, or rhonchi Abd: soft, nontender, no distention.  Has ecchymosis on his left thigh and scrotum. Scrotum is significantly less distended today.  Ext: warm and well perfused, no pedal edema Neuro: alert and oriented X3, cranial nerves II-XII grossly intact, strength and sensation to light touch equal in bilateral upper and lower extremities  Lab Results: Basic Metabolic Panel:  Recent Labs Lab 07/13/14 0348 07/13/14 1514 07/14/14 0400  NA 128* 130* 128*  K 4.5 4.8 4.1  CL 90* 93* 93*  CO2 _1 GLUCOSE 188* 201* 281*  BUN 24* 18 12  CREATININE 1.24 0.96 0.77  CALCIUM 8.6* 8.7* 8.4*  MG 1.8  --   --   PHOS 3.5  --   --    Liver Function Tests:  Recent Labs Lab 07/13/14 0348  AST 18  ALT 15*  ALKPHOS 48  BILITOT 1.1  PROT 6.0*  ALBUMIN 2.8*  CBC:  Recent Labs Lab 07/12/14 0501 07/13/14 0348 07/14/14 0400  WBC 16.0* 9.0 8.2  NEUTROABS 12.9*  --   --   HGB 10.1* 9.4* 9.0*  HCT 28.2* 26.7* 26.3*    MCV 82.0 85.0 84.6  PLT 242 165 190  D-Dimer:  Recent Labs Lab 07/12/14 0501  DDIMER >20.00*   CBG:  Recent Labs Lab 07/12/14 1700 07/12/14 2128 07/13/14 0758 07/13/14 1250 07/13/14 1613 07/13/14 2056  GLUCAP 262* 201* 178* 134* 196* 277*   Urinalysis:  Recent Labs Lab 07/12/14 0556  COLORURINE YELLOW  LABSPEC 1.006  PHURINE 5.5  GLUCOSEU NEGATIVE  HGBUR MODERATE*  BILIRUBINUR NEGATIVE  KETONESUR NEGATIVE  PROTEINUR NEGATIVE  UROBILINOGEN 0.2  NITRITE NEGATIVE  LEUKOCYTESUR NEGATIVE   Misc. Labs:  Micro Results: Recent Results (from the past 240 hour(s))  MRSA PCR Screening     Status: None   Collection Time: 07/12/14 11:05 PM  Result Value Ref Range Status   MRSA by PCR NEGATIVE NEGATIVE Final    Comment:        The GeneXpert MRSA Assay (FDA approved for NASAL specimens only), is one component of a comprehensive MRSA colonization surveillance program. It is not intended to diagnose MRSA infection nor to guide or monitor treatment for MRSA infections.    Studies/Results: Ct Angio Chest Pe W/cm &/or Wo Cm  07/13/2014   ADDENDUM REPORT: 07/13/2014 10:20  ADDENDUM: Critical Value/emergent results were  called by telephone at the time of interpretation on 07/13/2014 at 10:20 am to Dr. Algis Liming, covering physician, who verbally acknowledged these results.   Electronically Signed   By: Lowella Grip III M.D.   On: 07/13/2014 10:20   07/13/2014   CLINICAL DATA:  Recent total knee replacement with difficulty breathing and tachycardia  EXAM: CT ANGIOGRAPHY CHEST WITH CONTRAST  TECHNIQUE: Multidetector CT imaging of the chest was performed using the standard protocol during bolus administration of intravenous contrast. Multiplanar CT image reconstructions and MIPs were obtained to evaluate the vascular anatomy.  CONTRAST:  41m OMNIPAQUE IOHEXOL 350 MG/ML SOLN  COMPARISON:  Chest radiograph July 12, 2014  FINDINGS: There is pulmonary embolism involving multiple lower  lobe branch vessels bilaterally, somewhat more severe on the right than on the left. There is also a pulmonary embolus in the posterior segment left upper lobe branch. Pulmonary embolus arises from the distal posterior most aspect of the right main pulmonary artery. The right ventricle to left ventricle diameter ratio is 0.9, borderline for right heart strain.  There is atherosclerotic change in the aorta without aneurysm or dissection.  There are multiple foci of coronary artery calcification, particularly in the left anterior descending coronary artery.  There is patchy atelectatic change in both lower lobes. A small area of infiltrate is noted in the inferior lingula.  There is no appreciable thoracic adenopathy. Thyroid appears unremarkable. The pericardium is not thickened.  In the visualized upper abdomen, no lesions are identified.  There are no blastic or lytic bone lesions. There is degenerative change in the thoracic spine.  Review of the MIP images confirms the above findings.  IMPRESSION: Positive for acute PE with borderline CT evidence of right heart strain (RV/LV Ratio = 0.9) consistent with at least submassive (intermediate risk) PE. The presence of right heart strain has been associated with an increased risk of morbidity and mortality. Please activate Code PE by paging [phone] 1 pulmonary emboli are primarily located in the lower lobe branches bilaterally, although there is a pulmonary embolus in a left upper lobe posterior segment branch.  Small area of infiltrate in the inferior lingula. Areas of patchy atelectasis bilaterally.  No adenopathy.  Electronically Signed: By: WLowella GripIII M.D. On: 07/13/2014 09:57   [place]  07/12/2014   CLINICAL DATA:  Acute renal failure.  Hypertension.  Diabetes.  EXAM: RENAL / URINARY TRACT ULTRASOUND COMPLETE  COMPARISON:  07/18/2011  FINDINGS: Right Kidney:  Length: 11.9 cm. Echogenicity within normal limits. No mass or hydronephrosis  visualized. 2.1 by 2.4 by 1.3 cm right mid upper kidney cyst, benign appearance.  Left Kidney:  Length: 11.5 cm. Echogenicity within normal limits. No mass or hydronephrosis visualized.  Bladder:  Foley catheter in the urinary bladder which appears otherwise unremarkable. The bladder is not drained, presumably the Foley was clamped.  IMPRESSION: 1. Right mid upper kidney cyst. Renal echogenicity and echotexture normal, without specific cause or associated feature for the patient's acute renal failure. 2. Foley catheter in the urinary bladder. The urinary bladder is not empty, presumably the Foley was clamped prior to the ultrasound.   Electronically Signed   By: WVan ClinesM.D.   On: 07/12/2014 16:35   Medications: I have reviewed the patient's current medications. Scheduled Meds: . aspirin EC  81 mg Oral Daily  . calcium carbonate  1 tablet Oral Daily  . enoxaparin (LOVENOX) injection  80 mg Subcutaneous Q12H  . feeding supplement (ENSURE ENLIVE)  237 mL  Oral BID BM  . insulin aspart  0-9 Units Subcutaneous TID WC  . metoprolol succinate  25 mg Oral Daily  . QUEtiapine  50 mg Oral QHS  . rosuvastatin  5 mg Oral Daily  . sodium chloride  3 mL Intravenous Q12H   Continuous Infusions: . dextrose Stopped (07/14/14 0200)   PRN Meds:.acetaminophen, ondansetron **OR** ondansetron (ZOFRAN) IV, pantoprazole Assessment/Plan: Active Problems:   Urinary retention   Acute renal failure  Submassive PE - likely provoked given recent knee surgery.  Came in with SOB, hypoxia, sweating. Ddimer elevated, initially could not do CT angio b/c of AKI but today CT angio showed submassive PE bilaterally lower lobes, R>L. CT showed possible RH strain. ECHO does not indicate RH strain. Patient is not having dizziness, vitals are stable. Will not call PCCM yet since he is not unstable. Was hypoxic initially but ambulated with PT and sat was 90%+ during ambulation on room air. - we started lovenox on  admission. Will transition to Combs.  - reassess after 6 months. Will leave this upto PCP outpatient. May need coag panel.  AKI - 2/2 to urinary retention - resolved -came in with crt 3.28 (was 1.03 on 07/04/2014). Placed foley and drained about 2.2 L immediately. His kidney function is almost back to normal Crt 0.77 today.   Hyponatremia - 2/2 to urinary retention - corrected with foley cath. Initially came in 119, was normal in the past, then corrected to 128 yesterday May have overcorrected as this may have been going on 48+ hours. Goal correction should be less than 8 in 24 hours. We have started D5W 75cc/hr to prevent rapid correction of sodium. Today sodium 128, which is around goal. Will stop the fluid. - follow BMET outpatient.  Urinary retention -likely 2/2 to the combination of several factors including femoral nerve block, femoral vein nick causing edema, pain, and pain meds. Given the fact that he drained about 2.2L after placing the foley, he may not be able to void on his own for now. Discussed with urology with Dr. Leitha Schuller, recommended leaving foley for now and discharging with foley. He recommended giving Flomax 0.85m daily on discharge and to follow up with him in 1 week. Wife stated she has her own urologist and will take him there.  Recent knee replacement (left knee) - Dr. LLorre Nickperformed the surgery and can be reached at [phone] questions. He was informed about the admission. He stated that intraoperatively, pt received femoral nerve block and anthesthesia nicked the femoral vein causing large scrotal and left thigh ecchymosis. - hgb down to 9.0. (was 11.4 on 07/04/2014). This is likely 2/2 to his hematoma from surgery. - follow up CBC outpatient.  Hx of DM II- last hgba1c 6.4 on 06/2014, on glipizide at home I would prefer metformin over glipizide but will leave this upto PCP outpatient. - monitored CBG here on SSI.  Dispo: Disposition is deferred at this time,  awaiting improvement of current medical problems.  Anticipated discharge in approximately 1 day  The patient does have a current PCP ([place]averna Peace NP) and [facility].  The patient does have transportation limitations that hinder transportation to clinic appointments.  .Services Needed at time of discharge: Y = Yes, Blank = No PT:   OT:   RN:   Equipment:   Other:     LOS: 2 days   TDellia Nims MD 07/14/2014, 7:29 AM

## 2014-07-15 DIAGNOSIS — Z471 Aftercare following joint replacement surgery: Secondary | ICD-10-CM | POA: Diagnosis not present

## 2014-07-15 DIAGNOSIS — I2510 Atherosclerotic heart disease of native coronary artery without angina pectoris: Secondary | ICD-10-CM | POA: Diagnosis not present

## 2014-07-15 DIAGNOSIS — Z7901 Long term (current) use of anticoagulants: Secondary | ICD-10-CM | POA: Diagnosis not present

## 2014-07-15 DIAGNOSIS — E119 Type 2 diabetes mellitus without complications: Secondary | ICD-10-CM | POA: Diagnosis not present

## 2014-07-15 DIAGNOSIS — Z96652 Presence of left artificial knee joint: Secondary | ICD-10-CM | POA: Diagnosis not present

## 2014-07-15 DIAGNOSIS — Z87891 Personal history of nicotine dependence: Secondary | ICD-10-CM | POA: Diagnosis not present

## 2014-07-17 NOTE — Discharge Summary (Signed)
SPORTS MEDICINE & JOINT REPLACEMENT   Lara Mulch, MD   Carlynn Spry, PA-C [place], [place], [place]  [address]                             [phone]  PATIENT ID: Christian Patton        MRN:  [id number]          DOB/AGE: [birth year] / 74 y.o.    DISCHARGE SUMMARY  ADMISSION DATE:    07/03/2014 DISCHARGE DATE:  07/04/2014  ADMISSION DIAGNOSIS: Primary osteoarthritis left knee    DISCHARGE DIAGNOSIS:  Primary osteoarthritis left knee    ADDITIONAL DIAGNOSIS: Active Problems:   S/P total knee arthroplasty  Past Medical History  Diagnosis Date  . Coronary artery disease   . 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 Hepatitis     C       "Has taken Harvoni"  . Arthritis   . Hypertension   . Diabetes mellitus without complication     TYPE 2  . Urine retention 07/12/2014    PROCEDURE: Procedure(s): LEFT TOTAL KNEE ARTHOPLASTY on 07/03/2014  CONSULTS:     HISTORY:  See H&P in chart  HOSPITAL COURSE:  Christian Patton is a 74 y.o. admitted on 07/03/2014 and found to have a diagnosis of Primary osteoarthritis left knee.  After appropriate laboratory studies were obtained  they were taken to the operating room on 07/03/2014 and underwent Procedure(s): LEFT TOTAL KNEE ARTHOPLASTY.   They were given perioperative antibiotics:  Anti-infectives    Start     Dose/Rate Route Frequency Ordered Stop   07/03/14 1500  ceFAZolin (ANCEF) IVPB 1 g/50 mL premix     1 g 100 mL/hr over 30 Minutes Intravenous Every 6 hours 07/03/14 1426 07/03/14 2212   07/03/14 0600  ceFAZolin (ANCEF) 3 g in dextrose 5 % 50 mL IVPB     3 g 160 mL/hr over 30 Minutes Intravenous On call to O.R. 07/02/14 1453 07/03/14 0900    .  Tolerated the procedure well.  Placed with a foley intraoperatively.  Given Ofirmev at induction and for 48 hours.    POD# 1: Vital signs were stable.  Patient denied Chest pain, shortness of breath, or calf pain.  Patient was started on Lovenox 30  mg subcutaneously twice daily at 8am.  Consults to PT, OT, and care management were made.  The patient was weight bearing as tolerated.  CPM was placed on the operative leg 0-90 degrees for 6-8 hours a day.  Incentive spirometry was taught.  Dressing was changed.  Hemovac was discontinued.      Continued  PT for ambulation and exercise program.  IV saline locked.  O2 discontinued.     The remainder of the hospital course was dedicated to ambulation and strengthening.   The patient was discharged on 1 day post op in  Good condition.  Blood products given:none  DIAGNOSTIC STUDIES: Recent vital signs: No data found.      Recent laboratory studies:  Recent Labs  07/12/14 0501 07/13/14 0348 07/14/14 0400  WBC 16.0* 9.0 8.2  HGB 10.1* 9.4* 9.0*  HCT 28.2* 26.7* 26.3*  PLT 242 165 190    Recent Labs  07/12/14 0501 07/12/14 0914 07/12/14 1304 07/12/14 1714 07/13/14 0348 07/13/14 1514 07/14/14 0400  NA 119* 123* 124* 125* 128* 130* 128*  K 4.1 4.5 4.6 4.3 4.5  4.8 4.1  CL 80* 88* 87* 87* 90* 93* 93*  CO2 _0 BUN 37* 33* 31* 29* 24* 18 12  CREATININE 3.28* 2.26* 2.03* 1.85* 1.24 0.96 0.77  GLUCOSE 276* 262* 227* 276* 188* 201* 281*  CALCIUM 8.6* 8.4* 8.7* 8.7* 8.6* 8.7* 8.4*   Lab Results  Component Value Date   INR 1.14 06/23/2014     Recent Radiographic Studies :  Dg Chest 2 View  06/23/2014   CLINICAL DATA:  Pre-Op LEFT TOTAL KNEE ARTHOPLASTY 07/03/14. No chest complaints. Pt has hx of CAD, diabetes, GERD, Hep C. Past-Smoker @ 1ppd x 50 yrs.  EXAM: CHEST  2 VIEW  COMPARISON:  None.  FINDINGS: Cardiac silhouette normal in size and configuration. Left coronary artery stent noted. Aorta is mildly uncoiled. No mediastinal or hilar masses or evidence of adenopathy.  Chronic bronchitic change noted in the lower lobes. Area of reticular scarring noted in the peripheral right upper lobe. No lung consolidation or edema. No pleural effusion or pneumothorax.  Bony  thorax is demineralized but intact.  IMPRESSION: No acute cardiopulmonary disease.   Electronically Signed   By: Lajean Manes M.D.   On: 06/23/2014 17:00   Ct Angio Chest Pe W/cm &/or Wo Cm  07/13/2014   ADDENDUM REPORT: 07/13/2014 10:20  ADDENDUM: Critical Value/emergent results were called by telephone at the time of interpretation on 07/13/2014 at 10:20 am to Dr. Algis Liming, covering physician, who verbally acknowledged these results.   Electronically Signed   By: Lowella Grip III M.D.   On: 07/13/2014 10:20   07/13/2014   CLINICAL DATA:  Recent total knee replacement with difficulty breathing and tachycardia  EXAM: CT ANGIOGRAPHY CHEST WITH CONTRAST  TECHNIQUE: Multidetector CT imaging of the chest was performed using the standard protocol during bolus administration of intravenous contrast. Multiplanar CT image reconstructions and MIPs were obtained to evaluate the vascular anatomy.  CONTRAST:  55m OMNIPAQUE IOHEXOL 350 MG/ML SOLN  COMPARISON:  Chest radiograph July 12, 2014  FINDINGS: There is pulmonary embolism involving multiple lower lobe branch vessels bilaterally, somewhat more severe on the right than on the left. There is also a pulmonary embolus in the posterior segment left upper lobe branch. Pulmonary embolus arises from the distal posterior most aspect of the right main pulmonary artery. The right ventricle to left ventricle diameter ratio is 0.9, borderline for right heart strain.  There is atherosclerotic change in the aorta without aneurysm or dissection.  There are multiple foci of coronary artery calcification, particularly in the left anterior descending coronary artery.  There is patchy atelectatic change in both lower lobes. A small area of infiltrate is noted in the inferior lingula.  There is no appreciable thoracic adenopathy. Thyroid appears unremarkable. The pericardium is not thickened.  In the visualized upper abdomen, no lesions are identified.  There are no blastic or lytic bone  lesions. There is degenerative change in the thoracic spine.  Review of the MIP images confirms the above findings.  IMPRESSION: Positive for acute PE with borderline CT evidence of right heart strain (RV/LV Ratio = 0.9) consistent with at least submassive (intermediate risk) PE. The presence of right heart strain has been associated with an increased risk of morbidity and mortality. Please activate Code PE by paging [phone] 1 pulmonary emboli are primarily located in the lower lobe branches bilaterally, although there is a pulmonary embolus in a left upper lobe posterior segment branch.  Small area of infiltrate in  the inferior lingula. Areas of patchy atelectasis bilaterally.  No adenopathy.  Electronically Signed: By: Lowella Grip III M.D. On: 07/13/2014 09:57   US Renal  07/12/2014   CLINICAL DATA:  Acute renal failure.  Hypertension.  Diabetes.  EXAM: RENAL / URINARY TRACT ULTRASOUND COMPLETE  COMPARISON:  07/18/2011  FINDINGS: Right Kidney:  Length: 11.9 cm. Echogenicity within normal limits. No mass or hydronephrosis visualized. 2.1 by 2.4 by 1.3 cm right mid upper kidney cyst, benign appearance.  Left Kidney:  Length: 11.5 cm. Echogenicity within normal limits. No mass or hydronephrosis visualized.  Bladder:  Foley catheter in the urinary bladder which appears otherwise unremarkable. The bladder is not drained, presumably the Foley was clamped.  IMPRESSION: 1. Right mid upper kidney cyst. Renal echogenicity and echotexture normal, without specific cause or associated feature for the patient's acute renal failure. 2. Foley catheter in the urinary bladder. The urinary bladder is not empty, presumably the Foley was clamped prior to the ultrasound.   Electronically Signed   By: Van Clines M.D.   On: 07/12/2014 16:35   Dg Chest Portable 1 View  07/12/2014   CLINICAL DATA:  Chest tightness and shortness of breath since this morning. Recent knee replacement 1 week prior.  EXAM: PORTABLE CHEST  - 1 VIEW  COMPARISON:  06/23/2014  FINDINGS: Lower lung volumes from prior exam. The heart size and mediastinal contours are unchanged allowing for differences in technique. There is linear atelectasis in the left lower lung zone. No consolidation, large pleural effusion or pneumothorax. No pulmonary edema. No acute osseous abnormalities are seen.  IMPRESSION: Linear atelectasis at the left lung base.   Electronically Signed   By: Jeb Levering M.D.   On: 07/12/2014 06:39    DISCHARGE INSTRUCTIONS: Discharge Instructions    CPM    Complete by:  As directed   Continuous passive motion machine (CPM):      Use the CPM from 0 to 90 for 6-8 hours per day.      You may increase by 10 per day.  You may break it up into 2 or 3 sessions per day.      Use CPM for 2 weeks or until you are told to stop.     Call MD / Call 911    Complete by:  As directed   If you experience chest pain or shortness of breath, CALL 911 and be transported to the hospital emergency room.  If you develope a fever above 101 F, pus (white drainage) or increased drainage or redness at the wound, or calf pain, call your surgeon's office.     Change dressing    Complete by:  As directed   Change dressing on wednesday, then change the dressing daily with sterile 4 x 4 inch gauze dressing and apply TED hose.  You may clean the incision with alcohol prior to redressing.     Constipation Prevention    Complete by:  As directed   Drink plenty of fluids.  Prune juice may be helpful.  You may use a stool softener, such as Colace (over the counter) 100 mg twice a day.  Use MiraLax (over the counter) for constipation as needed.     Diet - low sodium heart healthy    Complete by:  As directed      Do not put a pillow under the knee. Place it under the heel.    Complete by:  As directed      Driving  restrictions    Complete by:  As directed   No driving for 6 weeks     Increase activity slowly as tolerated    Complete by:  As directed       Lifting restrictions    Complete by:  As directed   No lifting for 6 weeks     TED hose    Complete by:  As directed   Use stockings (TED hose) for 2 weeks on both leg(s).  You may remove them at night for sleeping.           DISCHARGE MEDICATIONS:     Medication List    STOP taking these medications        aspirin 81 MG chewable tablet     glipiZIDE 5 MG 24 hr tablet  Commonly known as:  GLUCOTROL XL     verapamil 120 MG tablet  Commonly known as:  CALAN     zaleplon 10 MG capsule  Commonly known as:  SONATA      TAKE these medications        CALCIUM PO  Take 800 mg by mouth daily.     hydrochlorothiazide 25 MG tablet  Commonly known as:  HYDRODIURIL  Take 12.5-25 mg by mouth daily.     MAGNESIUM GLYCINATE PLUS PO  Take 1 tablet by mouth daily.     metoprolol succinate 25 MG 24 hr tablet  Commonly known as:  TOPROL-XL  Take 25 mg by mouth daily.     multivitamin tablet  Take 1 tablet by mouth daily.     oxyCODONE 5 MG immediate release tablet  Commonly known as:  Oxy IR/ROXICODONE  Take 1-2 tablets (5-10 mg total) by mouth every 4 (four) hours as needed for breakthrough pain.     OxyCODONE 10 mg T12a 12 hr tablet  Commonly known as:  OXYCONTIN  Take 1 tablet (10 mg total) by mouth every 12 (twelve) hours.     pantoprazole 40 MG tablet  Commonly known as:  PROTONIX  Take 40 mg by mouth daily as needed (indigestion).     QUEtiapine 25 MG tablet  Commonly known as:  SEROQUEL  Take 25-50 mg by mouth at bedtime.     rosuvastatin 5 MG tablet  Commonly known as:  CRESTOR  Take 5 mg by mouth daily.        FOLLOW UP VISIT:       Follow-up Information    Follow up with Rudean Haskell, MD On 07/04/2014.   Specialty:  Orthopedic Surgery   Contact information:   [place] [address] [phone]       Follow up with [facility].   Why:  Someone from [facility] will contact you concerning start date  and time for therapy.   Contact information:   [address] [place] [address] [phone]       DISPOSITION: HOME   CONDITION:  Good   Christian Patton 07/17/2014, 10:05 AM

## 2014-07-19 DIAGNOSIS — Z7901 Long term (current) use of anticoagulants: Secondary | ICD-10-CM | POA: Diagnosis not present

## 2014-07-19 DIAGNOSIS — Z87891 Personal history of nicotine dependence: Secondary | ICD-10-CM | POA: Diagnosis not present

## 2014-07-19 DIAGNOSIS — E119 Type 2 diabetes mellitus without complications: Secondary | ICD-10-CM | POA: Diagnosis not present

## 2014-07-19 DIAGNOSIS — Z471 Aftercare following joint replacement surgery: Secondary | ICD-10-CM | POA: Diagnosis not present

## 2014-07-19 DIAGNOSIS — I2510 Atherosclerotic heart disease of native coronary artery without angina pectoris: Secondary | ICD-10-CM | POA: Diagnosis not present

## 2014-07-19 DIAGNOSIS — Z96652 Presence of left artificial knee joint: Secondary | ICD-10-CM | POA: Diagnosis not present

## 2014-07-20 DIAGNOSIS — Z7901 Long term (current) use of anticoagulants: Secondary | ICD-10-CM | POA: Diagnosis not present

## 2014-07-20 DIAGNOSIS — R312 Other microscopic hematuria: Secondary | ICD-10-CM | POA: Diagnosis not present

## 2014-07-20 DIAGNOSIS — I2510 Atherosclerotic heart disease of native coronary artery without angina pectoris: Secondary | ICD-10-CM | POA: Diagnosis not present

## 2014-07-20 DIAGNOSIS — R339 Retention of urine, unspecified: Secondary | ICD-10-CM | POA: Diagnosis not present

## 2014-07-20 DIAGNOSIS — Z87891 Personal history of nicotine dependence: Secondary | ICD-10-CM | POA: Diagnosis not present

## 2014-07-20 DIAGNOSIS — R351 Nocturia: Secondary | ICD-10-CM | POA: Diagnosis not present

## 2014-07-20 DIAGNOSIS — E119 Type 2 diabetes mellitus without complications: Secondary | ICD-10-CM | POA: Diagnosis not present

## 2014-07-20 DIAGNOSIS — Z96652 Presence of left artificial knee joint: Secondary | ICD-10-CM | POA: Diagnosis not present

## 2014-07-20 DIAGNOSIS — N3091 Cystitis, unspecified with hematuria: Secondary | ICD-10-CM | POA: Diagnosis not present

## 2014-07-20 DIAGNOSIS — N401 Enlarged prostate with lower urinary tract symptoms: Secondary | ICD-10-CM | POA: Diagnosis not present

## 2014-07-20 DIAGNOSIS — Z471 Aftercare following joint replacement surgery: Secondary | ICD-10-CM | POA: Diagnosis not present

## 2014-07-21 DIAGNOSIS — Z7901 Long term (current) use of anticoagulants: Secondary | ICD-10-CM | POA: Diagnosis not present

## 2014-07-21 DIAGNOSIS — Z87891 Personal history of nicotine dependence: Secondary | ICD-10-CM | POA: Diagnosis not present

## 2014-07-21 DIAGNOSIS — Z471 Aftercare following joint replacement surgery: Secondary | ICD-10-CM | POA: Diagnosis not present

## 2014-07-21 DIAGNOSIS — E119 Type 2 diabetes mellitus without complications: Secondary | ICD-10-CM | POA: Diagnosis not present

## 2014-07-21 DIAGNOSIS — I2510 Atherosclerotic heart disease of native coronary artery without angina pectoris: Secondary | ICD-10-CM | POA: Diagnosis not present

## 2014-07-21 DIAGNOSIS — Z96652 Presence of left artificial knee joint: Secondary | ICD-10-CM | POA: Diagnosis not present

## 2014-07-22 DIAGNOSIS — Z87891 Personal history of nicotine dependence: Secondary | ICD-10-CM | POA: Diagnosis not present

## 2014-07-22 DIAGNOSIS — Z7901 Long term (current) use of anticoagulants: Secondary | ICD-10-CM | POA: Diagnosis not present

## 2014-07-22 DIAGNOSIS — E119 Type 2 diabetes mellitus without complications: Secondary | ICD-10-CM | POA: Diagnosis not present

## 2014-07-22 DIAGNOSIS — I2510 Atherosclerotic heart disease of native coronary artery without angina pectoris: Secondary | ICD-10-CM | POA: Diagnosis not present

## 2014-07-22 DIAGNOSIS — Z471 Aftercare following joint replacement surgery: Secondary | ICD-10-CM | POA: Diagnosis not present

## 2014-07-22 DIAGNOSIS — Z96652 Presence of left artificial knee joint: Secondary | ICD-10-CM | POA: Diagnosis not present

## 2014-07-24 DIAGNOSIS — N401 Enlarged prostate with lower urinary tract symptoms: Secondary | ICD-10-CM | POA: Diagnosis not present

## 2014-07-24 DIAGNOSIS — R312 Other microscopic hematuria: Secondary | ICD-10-CM | POA: Diagnosis not present

## 2014-07-24 DIAGNOSIS — M25562 Pain in left knee: Secondary | ICD-10-CM | POA: Diagnosis not present

## 2014-07-24 DIAGNOSIS — R339 Retention of urine, unspecified: Secondary | ICD-10-CM | POA: Diagnosis not present

## 2014-07-24 DIAGNOSIS — N318 Other neuromuscular dysfunction of bladder: Secondary | ICD-10-CM | POA: Diagnosis not present

## 2014-07-24 DIAGNOSIS — R351 Nocturia: Secondary | ICD-10-CM | POA: Diagnosis not present

## 2014-07-24 DIAGNOSIS — M62552 Muscle wasting and atrophy, not elsewhere classified, left thigh: Secondary | ICD-10-CM | POA: Diagnosis not present

## 2014-07-24 DIAGNOSIS — M25462 Effusion, left knee: Secondary | ICD-10-CM | POA: Diagnosis not present

## 2014-07-24 DIAGNOSIS — N3090 Cystitis, unspecified without hematuria: Secondary | ICD-10-CM | POA: Diagnosis not present

## 2014-07-24 DIAGNOSIS — Z96652 Presence of left artificial knee joint: Secondary | ICD-10-CM | POA: Diagnosis not present

## 2014-07-26 DIAGNOSIS — M25462 Effusion, left knee: Secondary | ICD-10-CM | POA: Diagnosis not present

## 2014-07-26 DIAGNOSIS — Z96652 Presence of left artificial knee joint: Secondary | ICD-10-CM | POA: Diagnosis not present

## 2014-07-26 DIAGNOSIS — M25562 Pain in left knee: Secondary | ICD-10-CM | POA: Diagnosis not present

## 2014-07-26 DIAGNOSIS — M62552 Muscle wasting and atrophy, not elsewhere classified, left thigh: Secondary | ICD-10-CM | POA: Diagnosis not present

## 2014-07-28 DIAGNOSIS — D539 Nutritional anemia, unspecified: Secondary | ICD-10-CM | POA: Diagnosis not present

## 2014-07-28 DIAGNOSIS — I2699 Other pulmonary embolism without acute cor pulmonale: Secondary | ICD-10-CM | POA: Diagnosis not present

## 2014-07-28 DIAGNOSIS — M25562 Pain in left knee: Secondary | ICD-10-CM | POA: Diagnosis not present

## 2014-07-28 DIAGNOSIS — M62552 Muscle wasting and atrophy, not elsewhere classified, left thigh: Secondary | ICD-10-CM | POA: Diagnosis not present

## 2014-07-28 DIAGNOSIS — M179 Osteoarthritis of knee, unspecified: Secondary | ICD-10-CM | POA: Diagnosis not present

## 2014-07-28 DIAGNOSIS — E119 Type 2 diabetes mellitus without complications: Secondary | ICD-10-CM | POA: Diagnosis not present

## 2014-07-28 DIAGNOSIS — Z96652 Presence of left artificial knee joint: Secondary | ICD-10-CM | POA: Diagnosis not present

## 2014-07-28 DIAGNOSIS — R339 Retention of urine, unspecified: Secondary | ICD-10-CM | POA: Diagnosis not present

## 2014-07-28 DIAGNOSIS — N179 Acute kidney failure, unspecified: Secondary | ICD-10-CM | POA: Diagnosis not present

## 2014-07-28 DIAGNOSIS — I10 Essential (primary) hypertension: Secondary | ICD-10-CM | POA: Diagnosis not present

## 2014-07-28 DIAGNOSIS — M25462 Effusion, left knee: Secondary | ICD-10-CM | POA: Diagnosis not present

## 2014-07-31 DIAGNOSIS — Z96652 Presence of left artificial knee joint: Secondary | ICD-10-CM | POA: Diagnosis not present

## 2014-07-31 DIAGNOSIS — M25462 Effusion, left knee: Secondary | ICD-10-CM | POA: Diagnosis not present

## 2014-07-31 DIAGNOSIS — M25562 Pain in left knee: Secondary | ICD-10-CM | POA: Diagnosis not present

## 2014-07-31 DIAGNOSIS — R339 Retention of urine, unspecified: Secondary | ICD-10-CM | POA: Diagnosis not present

## 2014-07-31 DIAGNOSIS — N401 Enlarged prostate with lower urinary tract symptoms: Secondary | ICD-10-CM | POA: Diagnosis not present

## 2014-07-31 DIAGNOSIS — N3091 Cystitis, unspecified with hematuria: Secondary | ICD-10-CM | POA: Diagnosis not present

## 2014-07-31 DIAGNOSIS — R312 Other microscopic hematuria: Secondary | ICD-10-CM | POA: Diagnosis not present

## 2014-07-31 DIAGNOSIS — M62552 Muscle wasting and atrophy, not elsewhere classified, left thigh: Secondary | ICD-10-CM | POA: Diagnosis not present

## 2014-07-31 DIAGNOSIS — N318 Other neuromuscular dysfunction of bladder: Secondary | ICD-10-CM | POA: Diagnosis not present

## 2014-08-02 DIAGNOSIS — M62552 Muscle wasting and atrophy, not elsewhere classified, left thigh: Secondary | ICD-10-CM | POA: Diagnosis not present

## 2014-08-02 DIAGNOSIS — M25462 Effusion, left knee: Secondary | ICD-10-CM | POA: Diagnosis not present

## 2014-08-02 DIAGNOSIS — M25562 Pain in left knee: Secondary | ICD-10-CM | POA: Diagnosis not present

## 2014-08-02 DIAGNOSIS — Z96652 Presence of left artificial knee joint: Secondary | ICD-10-CM | POA: Diagnosis not present

## 2014-08-04 DIAGNOSIS — M25462 Effusion, left knee: Secondary | ICD-10-CM | POA: Diagnosis not present

## 2014-08-04 DIAGNOSIS — M25562 Pain in left knee: Secondary | ICD-10-CM | POA: Diagnosis not present

## 2014-08-04 DIAGNOSIS — Z96652 Presence of left artificial knee joint: Secondary | ICD-10-CM | POA: Diagnosis not present

## 2014-08-04 DIAGNOSIS — M62552 Muscle wasting and atrophy, not elsewhere classified, left thigh: Secondary | ICD-10-CM | POA: Diagnosis not present

## 2014-08-07 DIAGNOSIS — Z96652 Presence of left artificial knee joint: Secondary | ICD-10-CM | POA: Diagnosis not present

## 2014-08-07 DIAGNOSIS — M62552 Muscle wasting and atrophy, not elsewhere classified, left thigh: Secondary | ICD-10-CM | POA: Diagnosis not present

## 2014-08-07 DIAGNOSIS — M25462 Effusion, left knee: Secondary | ICD-10-CM | POA: Diagnosis not present

## 2014-08-07 DIAGNOSIS — M25562 Pain in left knee: Secondary | ICD-10-CM | POA: Diagnosis not present

## 2014-08-08 DIAGNOSIS — M25462 Effusion, left knee: Secondary | ICD-10-CM | POA: Diagnosis not present

## 2014-08-08 DIAGNOSIS — M25562 Pain in left knee: Secondary | ICD-10-CM | POA: Diagnosis not present

## 2014-08-08 DIAGNOSIS — M62552 Muscle wasting and atrophy, not elsewhere classified, left thigh: Secondary | ICD-10-CM | POA: Diagnosis not present

## 2014-08-08 DIAGNOSIS — Z96652 Presence of left artificial knee joint: Secondary | ICD-10-CM | POA: Diagnosis not present

## 2014-08-10 DIAGNOSIS — M25562 Pain in left knee: Secondary | ICD-10-CM | POA: Diagnosis not present

## 2014-08-10 DIAGNOSIS — M25462 Effusion, left knee: Secondary | ICD-10-CM | POA: Diagnosis not present

## 2014-08-10 DIAGNOSIS — Z96652 Presence of left artificial knee joint: Secondary | ICD-10-CM | POA: Diagnosis not present

## 2014-08-10 DIAGNOSIS — M62552 Muscle wasting and atrophy, not elsewhere classified, left thigh: Secondary | ICD-10-CM | POA: Diagnosis not present

## 2014-08-11 DIAGNOSIS — E119 Type 2 diabetes mellitus without complications: Secondary | ICD-10-CM | POA: Diagnosis not present

## 2014-08-11 DIAGNOSIS — M25462 Effusion, left knee: Secondary | ICD-10-CM | POA: Diagnosis not present

## 2014-08-11 DIAGNOSIS — I2699 Other pulmonary embolism without acute cor pulmonale: Secondary | ICD-10-CM | POA: Diagnosis not present

## 2014-08-11 DIAGNOSIS — K5900 Constipation, unspecified: Secondary | ICD-10-CM | POA: Diagnosis not present

## 2014-08-11 DIAGNOSIS — Z96652 Presence of left artificial knee joint: Secondary | ICD-10-CM | POA: Diagnosis not present

## 2014-08-11 DIAGNOSIS — R339 Retention of urine, unspecified: Secondary | ICD-10-CM | POA: Diagnosis not present

## 2014-08-11 DIAGNOSIS — N179 Acute kidney failure, unspecified: Secondary | ICD-10-CM | POA: Diagnosis not present

## 2014-08-11 DIAGNOSIS — D539 Nutritional anemia, unspecified: Secondary | ICD-10-CM | POA: Diagnosis not present

## 2014-08-11 DIAGNOSIS — M25562 Pain in left knee: Secondary | ICD-10-CM | POA: Diagnosis not present

## 2014-08-11 DIAGNOSIS — M62552 Muscle wasting and atrophy, not elsewhere classified, left thigh: Secondary | ICD-10-CM | POA: Diagnosis not present

## 2014-08-11 DIAGNOSIS — Z6828 Body mass index (BMI) 28.0-28.9, adult: Secondary | ICD-10-CM | POA: Diagnosis not present

## 2014-08-11 DIAGNOSIS — E871 Hypo-osmolality and hyponatremia: Secondary | ICD-10-CM | POA: Diagnosis not present

## 2014-08-15 DIAGNOSIS — K7469 Other cirrhosis of liver: Secondary | ICD-10-CM | POA: Diagnosis not present

## 2014-08-15 DIAGNOSIS — M62552 Muscle wasting and atrophy, not elsewhere classified, left thigh: Secondary | ICD-10-CM | POA: Diagnosis not present

## 2014-08-15 DIAGNOSIS — M25562 Pain in left knee: Secondary | ICD-10-CM | POA: Diagnosis not present

## 2014-08-15 DIAGNOSIS — B182 Chronic viral hepatitis C: Secondary | ICD-10-CM | POA: Diagnosis not present

## 2014-08-15 DIAGNOSIS — M25462 Effusion, left knee: Secondary | ICD-10-CM | POA: Diagnosis not present

## 2014-08-15 DIAGNOSIS — B1920 Unspecified viral hepatitis C without hepatic coma: Secondary | ICD-10-CM | POA: Diagnosis not present

## 2014-08-15 DIAGNOSIS — Z96652 Presence of left artificial knee joint: Secondary | ICD-10-CM | POA: Diagnosis not present

## 2014-08-15 DIAGNOSIS — K7460 Unspecified cirrhosis of liver: Secondary | ICD-10-CM | POA: Diagnosis not present

## 2014-08-17 DIAGNOSIS — Z96652 Presence of left artificial knee joint: Secondary | ICD-10-CM | POA: Diagnosis not present

## 2014-08-17 DIAGNOSIS — M62552 Muscle wasting and atrophy, not elsewhere classified, left thigh: Secondary | ICD-10-CM | POA: Diagnosis not present

## 2014-08-17 DIAGNOSIS — M25562 Pain in left knee: Secondary | ICD-10-CM | POA: Diagnosis not present

## 2014-08-17 DIAGNOSIS — M25462 Effusion, left knee: Secondary | ICD-10-CM | POA: Diagnosis not present

## 2014-08-18 DIAGNOSIS — M62552 Muscle wasting and atrophy, not elsewhere classified, left thigh: Secondary | ICD-10-CM | POA: Diagnosis not present

## 2014-08-18 DIAGNOSIS — M25562 Pain in left knee: Secondary | ICD-10-CM | POA: Diagnosis not present

## 2014-08-18 DIAGNOSIS — Z96652 Presence of left artificial knee joint: Secondary | ICD-10-CM | POA: Diagnosis not present

## 2014-08-18 DIAGNOSIS — M25462 Effusion, left knee: Secondary | ICD-10-CM | POA: Diagnosis not present

## 2014-08-21 DIAGNOSIS — R312 Other microscopic hematuria: Secondary | ICD-10-CM | POA: Diagnosis not present

## 2014-08-21 DIAGNOSIS — R339 Retention of urine, unspecified: Secondary | ICD-10-CM | POA: Diagnosis not present

## 2014-08-21 DIAGNOSIS — N401 Enlarged prostate with lower urinary tract symptoms: Secondary | ICD-10-CM | POA: Diagnosis not present

## 2014-08-22 DIAGNOSIS — M25462 Effusion, left knee: Secondary | ICD-10-CM | POA: Diagnosis not present

## 2014-08-22 DIAGNOSIS — M25562 Pain in left knee: Secondary | ICD-10-CM | POA: Diagnosis not present

## 2014-08-22 DIAGNOSIS — N3090 Cystitis, unspecified without hematuria: Secondary | ICD-10-CM | POA: Diagnosis not present

## 2014-08-22 DIAGNOSIS — Z96652 Presence of left artificial knee joint: Secondary | ICD-10-CM | POA: Diagnosis not present

## 2014-08-22 DIAGNOSIS — M62552 Muscle wasting and atrophy, not elsewhere classified, left thigh: Secondary | ICD-10-CM | POA: Diagnosis not present

## 2014-08-23 DIAGNOSIS — E785 Hyperlipidemia, unspecified: Secondary | ICD-10-CM | POA: Diagnosis not present

## 2014-08-23 DIAGNOSIS — I2699 Other pulmonary embolism without acute cor pulmonale: Secondary | ICD-10-CM | POA: Diagnosis not present

## 2014-08-23 DIAGNOSIS — R339 Retention of urine, unspecified: Secondary | ICD-10-CM | POA: Diagnosis not present

## 2014-08-23 DIAGNOSIS — M179 Osteoarthritis of knee, unspecified: Secondary | ICD-10-CM | POA: Diagnosis not present

## 2014-08-23 DIAGNOSIS — I10 Essential (primary) hypertension: Secondary | ICD-10-CM | POA: Diagnosis not present

## 2014-08-23 DIAGNOSIS — D539 Nutritional anemia, unspecified: Secondary | ICD-10-CM | POA: Diagnosis not present

## 2014-08-23 DIAGNOSIS — E119 Type 2 diabetes mellitus without complications: Secondary | ICD-10-CM | POA: Diagnosis not present

## 2014-08-24 DIAGNOSIS — L821 Other seborrheic keratosis: Secondary | ICD-10-CM | POA: Diagnosis not present

## 2014-08-24 DIAGNOSIS — Z96652 Presence of left artificial knee joint: Secondary | ICD-10-CM | POA: Diagnosis not present

## 2014-08-24 DIAGNOSIS — M25462 Effusion, left knee: Secondary | ICD-10-CM | POA: Diagnosis not present

## 2014-08-24 DIAGNOSIS — L570 Actinic keratosis: Secondary | ICD-10-CM | POA: Diagnosis not present

## 2014-08-24 DIAGNOSIS — M25562 Pain in left knee: Secondary | ICD-10-CM | POA: Diagnosis not present

## 2014-08-24 DIAGNOSIS — L578 Other skin changes due to chronic exposure to nonionizing radiation: Secondary | ICD-10-CM | POA: Diagnosis not present

## 2014-08-24 DIAGNOSIS — M62552 Muscle wasting and atrophy, not elsewhere classified, left thigh: Secondary | ICD-10-CM | POA: Diagnosis not present

## 2014-08-25 DIAGNOSIS — Z96652 Presence of left artificial knee joint: Secondary | ICD-10-CM | POA: Diagnosis not present

## 2014-08-25 DIAGNOSIS — M25562 Pain in left knee: Secondary | ICD-10-CM | POA: Diagnosis not present

## 2014-08-25 DIAGNOSIS — M25462 Effusion, left knee: Secondary | ICD-10-CM | POA: Diagnosis not present

## 2014-08-25 DIAGNOSIS — M62552 Muscle wasting and atrophy, not elsewhere classified, left thigh: Secondary | ICD-10-CM | POA: Diagnosis not present

## 2014-08-28 DIAGNOSIS — M62552 Muscle wasting and atrophy, not elsewhere classified, left thigh: Secondary | ICD-10-CM | POA: Diagnosis not present

## 2014-08-28 DIAGNOSIS — M25462 Effusion, left knee: Secondary | ICD-10-CM | POA: Diagnosis not present

## 2014-08-28 DIAGNOSIS — Z96652 Presence of left artificial knee joint: Secondary | ICD-10-CM | POA: Diagnosis not present

## 2014-08-28 DIAGNOSIS — M25562 Pain in left knee: Secondary | ICD-10-CM | POA: Diagnosis not present

## 2014-08-29 DIAGNOSIS — M25462 Effusion, left knee: Secondary | ICD-10-CM | POA: Diagnosis not present

## 2014-08-29 DIAGNOSIS — M25562 Pain in left knee: Secondary | ICD-10-CM | POA: Diagnosis not present

## 2014-08-29 DIAGNOSIS — M62552 Muscle wasting and atrophy, not elsewhere classified, left thigh: Secondary | ICD-10-CM | POA: Diagnosis not present

## 2014-08-29 DIAGNOSIS — Z96652 Presence of left artificial knee joint: Secondary | ICD-10-CM | POA: Diagnosis not present

## 2014-09-04 DIAGNOSIS — M62552 Muscle wasting and atrophy, not elsewhere classified, left thigh: Secondary | ICD-10-CM | POA: Diagnosis not present

## 2014-09-04 DIAGNOSIS — M25462 Effusion, left knee: Secondary | ICD-10-CM | POA: Diagnosis not present

## 2014-09-04 DIAGNOSIS — M25562 Pain in left knee: Secondary | ICD-10-CM | POA: Diagnosis not present

## 2014-09-04 DIAGNOSIS — Z96652 Presence of left artificial knee joint: Secondary | ICD-10-CM | POA: Diagnosis not present

## 2014-09-06 DIAGNOSIS — M25562 Pain in left knee: Secondary | ICD-10-CM | POA: Diagnosis not present

## 2014-09-06 DIAGNOSIS — M62552 Muscle wasting and atrophy, not elsewhere classified, left thigh: Secondary | ICD-10-CM | POA: Diagnosis not present

## 2014-09-06 DIAGNOSIS — Z96652 Presence of left artificial knee joint: Secondary | ICD-10-CM | POA: Diagnosis not present

## 2014-09-06 DIAGNOSIS — M25462 Effusion, left knee: Secondary | ICD-10-CM | POA: Diagnosis not present

## 2014-09-08 DIAGNOSIS — M25562 Pain in left knee: Secondary | ICD-10-CM | POA: Diagnosis not present

## 2014-09-08 DIAGNOSIS — M25462 Effusion, left knee: Secondary | ICD-10-CM | POA: Diagnosis not present

## 2014-09-08 DIAGNOSIS — Z96652 Presence of left artificial knee joint: Secondary | ICD-10-CM | POA: Diagnosis not present

## 2014-09-08 DIAGNOSIS — M62552 Muscle wasting and atrophy, not elsewhere classified, left thigh: Secondary | ICD-10-CM | POA: Diagnosis not present

## 2014-09-12 DIAGNOSIS — M62552 Muscle wasting and atrophy, not elsewhere classified, left thigh: Secondary | ICD-10-CM | POA: Diagnosis not present

## 2014-09-12 DIAGNOSIS — Z96652 Presence of left artificial knee joint: Secondary | ICD-10-CM | POA: Diagnosis not present

## 2014-09-12 DIAGNOSIS — M25462 Effusion, left knee: Secondary | ICD-10-CM | POA: Diagnosis not present

## 2014-09-12 DIAGNOSIS — M25562 Pain in left knee: Secondary | ICD-10-CM | POA: Diagnosis not present

## 2014-09-13 DIAGNOSIS — Z7901 Long term (current) use of anticoagulants: Secondary | ICD-10-CM | POA: Insufficient documentation

## 2014-09-13 DIAGNOSIS — I10 Essential (primary) hypertension: Secondary | ICD-10-CM

## 2014-09-13 DIAGNOSIS — I421 Obstructive hypertrophic cardiomyopathy: Secondary | ICD-10-CM

## 2014-09-13 DIAGNOSIS — K7469 Other cirrhosis of liver: Secondary | ICD-10-CM

## 2014-09-13 DIAGNOSIS — I110 Hypertensive heart disease with heart failure: Secondary | ICD-10-CM

## 2014-09-13 DIAGNOSIS — I2510 Atherosclerotic heart disease of native coronary artery without angina pectoris: Secondary | ICD-10-CM

## 2014-09-13 DIAGNOSIS — I2699 Other pulmonary embolism without acute cor pulmonale: Secondary | ICD-10-CM

## 2014-09-13 DIAGNOSIS — Z9189 Other specified personal risk factors, not elsewhere classified: Secondary | ICD-10-CM

## 2014-09-13 DIAGNOSIS — B1920 Unspecified viral hepatitis C without hepatic coma: Secondary | ICD-10-CM

## 2014-09-13 DIAGNOSIS — I469 Cardiac arrest, cause unspecified: Secondary | ICD-10-CM

## 2014-09-13 HISTORY — DX: Atherosclerotic heart disease of native coronary artery without angina pectoris: I25.10

## 2014-09-13 HISTORY — DX: Other specified personal risk factors, not elsewhere classified: Z91.89

## 2014-09-13 HISTORY — DX: Obstructive hypertrophic cardiomyopathy: I42.1

## 2014-09-13 HISTORY — DX: Long term (current) use of anticoagulants: Z79.01

## 2014-09-13 HISTORY — DX: Essential (primary) hypertension: I10

## 2014-09-13 HISTORY — DX: Other cirrhosis of liver: B19.20

## 2014-09-13 HISTORY — DX: Other pulmonary embolism without acute cor pulmonale: I26.99

## 2014-09-13 HISTORY — DX: Cardiac arrest, cause unspecified: I46.9

## 2014-09-13 HISTORY — DX: Hypertensive heart disease with heart failure: I11.0

## 2014-09-14 DIAGNOSIS — Z6828 Body mass index (BMI) 28.0-28.9, adult: Secondary | ICD-10-CM | POA: Diagnosis not present

## 2014-09-14 DIAGNOSIS — L299 Pruritus, unspecified: Secondary | ICD-10-CM | POA: Diagnosis not present

## 2014-09-15 DIAGNOSIS — M25462 Effusion, left knee: Secondary | ICD-10-CM | POA: Diagnosis not present

## 2014-09-15 DIAGNOSIS — I421 Obstructive hypertrophic cardiomyopathy: Secondary | ICD-10-CM | POA: Diagnosis not present

## 2014-09-15 DIAGNOSIS — M25562 Pain in left knee: Secondary | ICD-10-CM | POA: Diagnosis not present

## 2014-09-15 DIAGNOSIS — Z96652 Presence of left artificial knee joint: Secondary | ICD-10-CM | POA: Diagnosis not present

## 2014-09-15 DIAGNOSIS — M62552 Muscle wasting and atrophy, not elsewhere classified, left thigh: Secondary | ICD-10-CM | POA: Diagnosis not present

## 2014-09-15 DIAGNOSIS — I2699 Other pulmonary embolism without acute cor pulmonale: Secondary | ICD-10-CM | POA: Diagnosis not present

## 2014-09-15 DIAGNOSIS — I2510 Atherosclerotic heart disease of native coronary artery without angina pectoris: Secondary | ICD-10-CM | POA: Diagnosis not present

## 2014-09-15 DIAGNOSIS — Z7901 Long term (current) use of anticoagulants: Secondary | ICD-10-CM | POA: Diagnosis not present

## 2014-09-19 DIAGNOSIS — Z96652 Presence of left artificial knee joint: Secondary | ICD-10-CM | POA: Diagnosis not present

## 2014-09-19 DIAGNOSIS — M25462 Effusion, left knee: Secondary | ICD-10-CM | POA: Diagnosis not present

## 2014-09-19 DIAGNOSIS — M25562 Pain in left knee: Secondary | ICD-10-CM | POA: Diagnosis not present

## 2014-09-19 DIAGNOSIS — M62552 Muscle wasting and atrophy, not elsewhere classified, left thigh: Secondary | ICD-10-CM | POA: Diagnosis not present

## 2014-09-21 DIAGNOSIS — N318 Other neuromuscular dysfunction of bladder: Secondary | ICD-10-CM | POA: Diagnosis not present

## 2014-09-21 DIAGNOSIS — R339 Retention of urine, unspecified: Secondary | ICD-10-CM | POA: Diagnosis not present

## 2014-09-21 DIAGNOSIS — R351 Nocturia: Secondary | ICD-10-CM | POA: Diagnosis not present

## 2014-09-21 DIAGNOSIS — N401 Enlarged prostate with lower urinary tract symptoms: Secondary | ICD-10-CM | POA: Diagnosis not present

## 2014-09-21 DIAGNOSIS — R312 Other microscopic hematuria: Secondary | ICD-10-CM | POA: Diagnosis not present

## 2014-09-21 DIAGNOSIS — N3091 Cystitis, unspecified with hematuria: Secondary | ICD-10-CM | POA: Diagnosis not present

## 2014-09-22 DIAGNOSIS — Z96652 Presence of left artificial knee joint: Secondary | ICD-10-CM | POA: Diagnosis not present

## 2014-09-22 DIAGNOSIS — M25562 Pain in left knee: Secondary | ICD-10-CM | POA: Diagnosis not present

## 2014-09-22 DIAGNOSIS — M25462 Effusion, left knee: Secondary | ICD-10-CM | POA: Diagnosis not present

## 2014-09-22 DIAGNOSIS — M62552 Muscle wasting and atrophy, not elsewhere classified, left thigh: Secondary | ICD-10-CM | POA: Diagnosis not present

## 2014-09-25 DIAGNOSIS — I10 Essential (primary) hypertension: Secondary | ICD-10-CM | POA: Diagnosis not present

## 2014-09-25 DIAGNOSIS — D539 Nutritional anemia, unspecified: Secondary | ICD-10-CM | POA: Diagnosis not present

## 2014-09-25 DIAGNOSIS — Z6829 Body mass index (BMI) 29.0-29.9, adult: Secondary | ICD-10-CM | POA: Diagnosis not present

## 2014-09-27 DIAGNOSIS — M25462 Effusion, left knee: Secondary | ICD-10-CM | POA: Diagnosis not present

## 2014-09-27 DIAGNOSIS — Z96652 Presence of left artificial knee joint: Secondary | ICD-10-CM | POA: Diagnosis not present

## 2014-09-27 DIAGNOSIS — M25562 Pain in left knee: Secondary | ICD-10-CM | POA: Diagnosis not present

## 2014-09-27 DIAGNOSIS — M62552 Muscle wasting and atrophy, not elsewhere classified, left thigh: Secondary | ICD-10-CM | POA: Diagnosis not present

## 2014-09-29 DIAGNOSIS — M25562 Pain in left knee: Secondary | ICD-10-CM | POA: Diagnosis not present

## 2014-09-29 DIAGNOSIS — M62552 Muscle wasting and atrophy, not elsewhere classified, left thigh: Secondary | ICD-10-CM | POA: Diagnosis not present

## 2014-09-29 DIAGNOSIS — Z96652 Presence of left artificial knee joint: Secondary | ICD-10-CM | POA: Diagnosis not present

## 2014-09-29 DIAGNOSIS — M25462 Effusion, left knee: Secondary | ICD-10-CM | POA: Diagnosis not present

## 2014-10-02 DIAGNOSIS — N281 Cyst of kidney, acquired: Secondary | ICD-10-CM | POA: Diagnosis not present

## 2014-10-02 DIAGNOSIS — B182 Chronic viral hepatitis C: Secondary | ICD-10-CM | POA: Diagnosis not present

## 2014-10-02 DIAGNOSIS — N200 Calculus of kidney: Secondary | ICD-10-CM | POA: Diagnosis not present

## 2014-10-02 DIAGNOSIS — K7460 Unspecified cirrhosis of liver: Secondary | ICD-10-CM | POA: Diagnosis not present

## 2014-10-04 DIAGNOSIS — M25562 Pain in left knee: Secondary | ICD-10-CM | POA: Diagnosis not present

## 2014-10-04 DIAGNOSIS — M25462 Effusion, left knee: Secondary | ICD-10-CM | POA: Diagnosis not present

## 2014-10-04 DIAGNOSIS — Z96652 Presence of left artificial knee joint: Secondary | ICD-10-CM | POA: Diagnosis not present

## 2014-10-04 DIAGNOSIS — M62552 Muscle wasting and atrophy, not elsewhere classified, left thigh: Secondary | ICD-10-CM | POA: Diagnosis not present

## 2014-10-06 DIAGNOSIS — M62552 Muscle wasting and atrophy, not elsewhere classified, left thigh: Secondary | ICD-10-CM | POA: Diagnosis not present

## 2014-10-06 DIAGNOSIS — M25562 Pain in left knee: Secondary | ICD-10-CM | POA: Diagnosis not present

## 2014-10-06 DIAGNOSIS — M25462 Effusion, left knee: Secondary | ICD-10-CM | POA: Diagnosis not present

## 2014-10-06 DIAGNOSIS — Z96652 Presence of left artificial knee joint: Secondary | ICD-10-CM | POA: Diagnosis not present

## 2014-10-09 DIAGNOSIS — M25562 Pain in left knee: Secondary | ICD-10-CM | POA: Diagnosis not present

## 2014-10-09 DIAGNOSIS — Z96652 Presence of left artificial knee joint: Secondary | ICD-10-CM | POA: Diagnosis not present

## 2014-10-09 DIAGNOSIS — M25462 Effusion, left knee: Secondary | ICD-10-CM | POA: Diagnosis not present

## 2014-10-09 DIAGNOSIS — M62552 Muscle wasting and atrophy, not elsewhere classified, left thigh: Secondary | ICD-10-CM | POA: Diagnosis not present

## 2014-10-11 DIAGNOSIS — Z96652 Presence of left artificial knee joint: Secondary | ICD-10-CM | POA: Diagnosis not present

## 2014-10-11 DIAGNOSIS — M62552 Muscle wasting and atrophy, not elsewhere classified, left thigh: Secondary | ICD-10-CM | POA: Diagnosis not present

## 2014-10-11 DIAGNOSIS — M25562 Pain in left knee: Secondary | ICD-10-CM | POA: Diagnosis not present

## 2014-10-11 DIAGNOSIS — M25462 Effusion, left knee: Secondary | ICD-10-CM | POA: Diagnosis not present

## 2014-10-17 DIAGNOSIS — M62552 Muscle wasting and atrophy, not elsewhere classified, left thigh: Secondary | ICD-10-CM | POA: Diagnosis not present

## 2014-10-17 DIAGNOSIS — Z96652 Presence of left artificial knee joint: Secondary | ICD-10-CM | POA: Diagnosis not present

## 2014-10-17 DIAGNOSIS — M25462 Effusion, left knee: Secondary | ICD-10-CM | POA: Diagnosis not present

## 2014-10-17 DIAGNOSIS — M25562 Pain in left knee: Secondary | ICD-10-CM | POA: Diagnosis not present

## 2014-10-19 DIAGNOSIS — M25462 Effusion, left knee: Secondary | ICD-10-CM | POA: Diagnosis not present

## 2014-10-19 DIAGNOSIS — Z96652 Presence of left artificial knee joint: Secondary | ICD-10-CM | POA: Diagnosis not present

## 2014-10-19 DIAGNOSIS — M62552 Muscle wasting and atrophy, not elsewhere classified, left thigh: Secondary | ICD-10-CM | POA: Diagnosis not present

## 2014-10-19 DIAGNOSIS — M25562 Pain in left knee: Secondary | ICD-10-CM | POA: Diagnosis not present

## 2014-10-23 DIAGNOSIS — I351 Nonrheumatic aortic (valve) insufficiency: Secondary | ICD-10-CM | POA: Diagnosis not present

## 2014-10-23 DIAGNOSIS — I700 Atherosclerosis of aorta: Secondary | ICD-10-CM | POA: Diagnosis not present

## 2014-10-23 DIAGNOSIS — R0602 Shortness of breath: Secondary | ICD-10-CM | POA: Diagnosis not present

## 2014-10-23 DIAGNOSIS — I517 Cardiomegaly: Secondary | ICD-10-CM | POA: Diagnosis not present

## 2014-10-25 DIAGNOSIS — M25462 Effusion, left knee: Secondary | ICD-10-CM | POA: Diagnosis not present

## 2014-10-25 DIAGNOSIS — M25562 Pain in left knee: Secondary | ICD-10-CM | POA: Diagnosis not present

## 2014-10-25 DIAGNOSIS — Z96652 Presence of left artificial knee joint: Secondary | ICD-10-CM | POA: Diagnosis not present

## 2014-10-25 DIAGNOSIS — M62552 Muscle wasting and atrophy, not elsewhere classified, left thigh: Secondary | ICD-10-CM | POA: Diagnosis not present

## 2014-10-26 DIAGNOSIS — M25462 Effusion, left knee: Secondary | ICD-10-CM | POA: Diagnosis not present

## 2014-10-26 DIAGNOSIS — Z96652 Presence of left artificial knee joint: Secondary | ICD-10-CM | POA: Diagnosis not present

## 2014-10-26 DIAGNOSIS — M62552 Muscle wasting and atrophy, not elsewhere classified, left thigh: Secondary | ICD-10-CM | POA: Diagnosis not present

## 2014-10-26 DIAGNOSIS — M25562 Pain in left knee: Secondary | ICD-10-CM | POA: Diagnosis not present

## 2014-10-31 DIAGNOSIS — M25462 Effusion, left knee: Secondary | ICD-10-CM | POA: Diagnosis not present

## 2014-10-31 DIAGNOSIS — M25562 Pain in left knee: Secondary | ICD-10-CM | POA: Diagnosis not present

## 2014-10-31 DIAGNOSIS — Z96652 Presence of left artificial knee joint: Secondary | ICD-10-CM | POA: Diagnosis not present

## 2014-10-31 DIAGNOSIS — M62552 Muscle wasting and atrophy, not elsewhere classified, left thigh: Secondary | ICD-10-CM | POA: Diagnosis not present

## 2014-11-01 DIAGNOSIS — D539 Nutritional anemia, unspecified: Secondary | ICD-10-CM | POA: Diagnosis not present

## 2014-11-01 DIAGNOSIS — F5104 Psychophysiologic insomnia: Secondary | ICD-10-CM | POA: Diagnosis not present

## 2014-11-01 DIAGNOSIS — R252 Cramp and spasm: Secondary | ICD-10-CM | POA: Diagnosis not present

## 2014-11-01 DIAGNOSIS — Z6828 Body mass index (BMI) 28.0-28.9, adult: Secondary | ICD-10-CM | POA: Diagnosis not present

## 2014-11-01 DIAGNOSIS — I2699 Other pulmonary embolism without acute cor pulmonale: Secondary | ICD-10-CM | POA: Diagnosis not present

## 2014-11-01 DIAGNOSIS — I10 Essential (primary) hypertension: Secondary | ICD-10-CM | POA: Diagnosis not present

## 2014-11-02 DIAGNOSIS — Z96652 Presence of left artificial knee joint: Secondary | ICD-10-CM | POA: Diagnosis not present

## 2014-11-02 DIAGNOSIS — M25562 Pain in left knee: Secondary | ICD-10-CM | POA: Diagnosis not present

## 2014-11-02 DIAGNOSIS — M25462 Effusion, left knee: Secondary | ICD-10-CM | POA: Diagnosis not present

## 2014-11-02 DIAGNOSIS — M62552 Muscle wasting and atrophy, not elsewhere classified, left thigh: Secondary | ICD-10-CM | POA: Diagnosis not present

## 2014-11-07 DIAGNOSIS — M25562 Pain in left knee: Secondary | ICD-10-CM | POA: Diagnosis not present

## 2014-11-07 DIAGNOSIS — Z96652 Presence of left artificial knee joint: Secondary | ICD-10-CM | POA: Diagnosis not present

## 2014-11-07 DIAGNOSIS — M62552 Muscle wasting and atrophy, not elsewhere classified, left thigh: Secondary | ICD-10-CM | POA: Diagnosis not present

## 2014-11-07 DIAGNOSIS — M25462 Effusion, left knee: Secondary | ICD-10-CM | POA: Diagnosis not present

## 2014-11-09 DIAGNOSIS — M25562 Pain in left knee: Secondary | ICD-10-CM | POA: Diagnosis not present

## 2014-11-09 DIAGNOSIS — Z96652 Presence of left artificial knee joint: Secondary | ICD-10-CM | POA: Diagnosis not present

## 2014-11-09 DIAGNOSIS — M62552 Muscle wasting and atrophy, not elsewhere classified, left thigh: Secondary | ICD-10-CM | POA: Diagnosis not present

## 2014-11-09 DIAGNOSIS — M25462 Effusion, left knee: Secondary | ICD-10-CM | POA: Diagnosis not present

## 2014-11-14 DIAGNOSIS — M62552 Muscle wasting and atrophy, not elsewhere classified, left thigh: Secondary | ICD-10-CM | POA: Diagnosis not present

## 2014-11-14 DIAGNOSIS — M25462 Effusion, left knee: Secondary | ICD-10-CM | POA: Diagnosis not present

## 2014-11-14 DIAGNOSIS — Z96652 Presence of left artificial knee joint: Secondary | ICD-10-CM | POA: Diagnosis not present

## 2014-11-14 DIAGNOSIS — M25562 Pain in left knee: Secondary | ICD-10-CM | POA: Diagnosis not present

## 2014-11-16 DIAGNOSIS — M25562 Pain in left knee: Secondary | ICD-10-CM | POA: Diagnosis not present

## 2014-11-16 DIAGNOSIS — M25462 Effusion, left knee: Secondary | ICD-10-CM | POA: Diagnosis not present

## 2014-11-16 DIAGNOSIS — Z96652 Presence of left artificial knee joint: Secondary | ICD-10-CM | POA: Diagnosis not present

## 2014-11-16 DIAGNOSIS — M62552 Muscle wasting and atrophy, not elsewhere classified, left thigh: Secondary | ICD-10-CM | POA: Diagnosis not present

## 2014-11-20 DIAGNOSIS — I2510 Atherosclerotic heart disease of native coronary artery without angina pectoris: Secondary | ICD-10-CM | POA: Diagnosis not present

## 2014-11-20 DIAGNOSIS — I10 Essential (primary) hypertension: Secondary | ICD-10-CM | POA: Diagnosis not present

## 2014-11-20 DIAGNOSIS — I2699 Other pulmonary embolism without acute cor pulmonale: Secondary | ICD-10-CM | POA: Diagnosis not present

## 2014-11-20 DIAGNOSIS — I421 Obstructive hypertrophic cardiomyopathy: Secondary | ICD-10-CM | POA: Diagnosis not present

## 2014-11-21 DIAGNOSIS — M62552 Muscle wasting and atrophy, not elsewhere classified, left thigh: Secondary | ICD-10-CM | POA: Diagnosis not present

## 2014-11-21 DIAGNOSIS — M25462 Effusion, left knee: Secondary | ICD-10-CM | POA: Diagnosis not present

## 2014-11-21 DIAGNOSIS — M25562 Pain in left knee: Secondary | ICD-10-CM | POA: Diagnosis not present

## 2014-11-21 DIAGNOSIS — Z96652 Presence of left artificial knee joint: Secondary | ICD-10-CM | POA: Diagnosis not present

## 2014-11-23 DIAGNOSIS — M25462 Effusion, left knee: Secondary | ICD-10-CM | POA: Diagnosis not present

## 2014-11-23 DIAGNOSIS — M25562 Pain in left knee: Secondary | ICD-10-CM | POA: Diagnosis not present

## 2014-11-23 DIAGNOSIS — Z96652 Presence of left artificial knee joint: Secondary | ICD-10-CM | POA: Diagnosis not present

## 2014-11-23 DIAGNOSIS — M62552 Muscle wasting and atrophy, not elsewhere classified, left thigh: Secondary | ICD-10-CM | POA: Diagnosis not present

## 2014-11-28 DIAGNOSIS — M62552 Muscle wasting and atrophy, not elsewhere classified, left thigh: Secondary | ICD-10-CM | POA: Diagnosis not present

## 2014-11-28 DIAGNOSIS — M25562 Pain in left knee: Secondary | ICD-10-CM | POA: Diagnosis not present

## 2014-11-28 DIAGNOSIS — Z96652 Presence of left artificial knee joint: Secondary | ICD-10-CM | POA: Diagnosis not present

## 2014-11-28 DIAGNOSIS — M25462 Effusion, left knee: Secondary | ICD-10-CM | POA: Diagnosis not present

## 2014-11-29 ENCOUNTER — Other Ambulatory Visit: Payer: Self-pay | Admitting: Internal Medicine

## 2014-11-29 DIAGNOSIS — E119 Type 2 diabetes mellitus without complications: Secondary | ICD-10-CM | POA: Diagnosis not present

## 2014-11-29 DIAGNOSIS — H35372 Puckering of macula, left eye: Secondary | ICD-10-CM | POA: Diagnosis not present

## 2014-11-29 DIAGNOSIS — H43812 Vitreous degeneration, left eye: Secondary | ICD-10-CM | POA: Diagnosis not present

## 2014-11-29 DIAGNOSIS — H353121 Nonexudative age-related macular degeneration, left eye, early dry stage: Secondary | ICD-10-CM | POA: Diagnosis not present

## 2014-11-30 DIAGNOSIS — M62552 Muscle wasting and atrophy, not elsewhere classified, left thigh: Secondary | ICD-10-CM | POA: Diagnosis not present

## 2014-11-30 DIAGNOSIS — Z96652 Presence of left artificial knee joint: Secondary | ICD-10-CM | POA: Diagnosis not present

## 2014-11-30 DIAGNOSIS — M25462 Effusion, left knee: Secondary | ICD-10-CM | POA: Diagnosis not present

## 2014-11-30 DIAGNOSIS — M25562 Pain in left knee: Secondary | ICD-10-CM | POA: Diagnosis not present

## 2014-12-05 DIAGNOSIS — E119 Type 2 diabetes mellitus without complications: Secondary | ICD-10-CM | POA: Diagnosis not present

## 2014-12-05 DIAGNOSIS — H9313 Tinnitus, bilateral: Secondary | ICD-10-CM | POA: Diagnosis not present

## 2014-12-05 DIAGNOSIS — H903 Sensorineural hearing loss, bilateral: Secondary | ICD-10-CM | POA: Diagnosis not present

## 2014-12-06 DIAGNOSIS — Z96652 Presence of left artificial knee joint: Secondary | ICD-10-CM | POA: Diagnosis not present

## 2014-12-06 DIAGNOSIS — M25462 Effusion, left knee: Secondary | ICD-10-CM | POA: Diagnosis not present

## 2014-12-06 DIAGNOSIS — M62552 Muscle wasting and atrophy, not elsewhere classified, left thigh: Secondary | ICD-10-CM | POA: Diagnosis not present

## 2014-12-06 DIAGNOSIS — M25562 Pain in left knee: Secondary | ICD-10-CM | POA: Diagnosis not present

## 2014-12-07 DIAGNOSIS — Z96652 Presence of left artificial knee joint: Secondary | ICD-10-CM | POA: Diagnosis not present

## 2014-12-27 DIAGNOSIS — I10 Essential (primary) hypertension: Secondary | ICD-10-CM | POA: Diagnosis not present

## 2014-12-27 DIAGNOSIS — E119 Type 2 diabetes mellitus without complications: Secondary | ICD-10-CM | POA: Diagnosis not present

## 2014-12-27 DIAGNOSIS — I2699 Other pulmonary embolism without acute cor pulmonale: Secondary | ICD-10-CM | POA: Diagnosis not present

## 2014-12-27 DIAGNOSIS — Z6829 Body mass index (BMI) 29.0-29.9, adult: Secondary | ICD-10-CM | POA: Diagnosis not present

## 2014-12-27 DIAGNOSIS — E785 Hyperlipidemia, unspecified: Secondary | ICD-10-CM | POA: Diagnosis not present

## 2014-12-27 DIAGNOSIS — F5104 Psychophysiologic insomnia: Secondary | ICD-10-CM | POA: Diagnosis not present

## 2015-01-11 DIAGNOSIS — R339 Retention of urine, unspecified: Secondary | ICD-10-CM | POA: Diagnosis not present

## 2015-01-11 DIAGNOSIS — N3020 Other chronic cystitis without hematuria: Secondary | ICD-10-CM | POA: Diagnosis not present

## 2015-01-11 DIAGNOSIS — N401 Enlarged prostate with lower urinary tract symptoms: Secondary | ICD-10-CM | POA: Diagnosis not present

## 2015-01-11 DIAGNOSIS — N318 Other neuromuscular dysfunction of bladder: Secondary | ICD-10-CM | POA: Diagnosis not present

## 2015-01-11 DIAGNOSIS — R351 Nocturia: Secondary | ICD-10-CM | POA: Diagnosis not present

## 2015-01-25 DIAGNOSIS — R197 Diarrhea, unspecified: Secondary | ICD-10-CM | POA: Diagnosis not present

## 2015-01-25 DIAGNOSIS — Z6829 Body mass index (BMI) 29.0-29.9, adult: Secondary | ICD-10-CM | POA: Diagnosis not present

## 2015-02-13 DIAGNOSIS — N3020 Other chronic cystitis without hematuria: Secondary | ICD-10-CM | POA: Diagnosis not present

## 2015-02-13 DIAGNOSIS — N401 Enlarged prostate with lower urinary tract symptoms: Secondary | ICD-10-CM | POA: Diagnosis not present

## 2015-02-13 DIAGNOSIS — R338 Other retention of urine: Secondary | ICD-10-CM | POA: Diagnosis not present

## 2015-02-13 DIAGNOSIS — N318 Other neuromuscular dysfunction of bladder: Secondary | ICD-10-CM | POA: Diagnosis not present

## 2015-02-13 DIAGNOSIS — R351 Nocturia: Secondary | ICD-10-CM | POA: Diagnosis not present

## 2015-03-22 DIAGNOSIS — B1920 Unspecified viral hepatitis C without hepatic coma: Secondary | ICD-10-CM | POA: Diagnosis not present

## 2015-03-22 DIAGNOSIS — K7469 Other cirrhosis of liver: Secondary | ICD-10-CM | POA: Diagnosis not present

## 2015-04-05 DIAGNOSIS — E119 Type 2 diabetes mellitus without complications: Secondary | ICD-10-CM | POA: Diagnosis not present

## 2015-04-05 DIAGNOSIS — Z9181 History of falling: Secondary | ICD-10-CM | POA: Diagnosis not present

## 2015-04-05 DIAGNOSIS — Z1389 Encounter for screening for other disorder: Secondary | ICD-10-CM | POA: Diagnosis not present

## 2015-04-05 DIAGNOSIS — I10 Essential (primary) hypertension: Secondary | ICD-10-CM | POA: Diagnosis not present

## 2015-04-05 DIAGNOSIS — Z6829 Body mass index (BMI) 29.0-29.9, adult: Secondary | ICD-10-CM | POA: Diagnosis not present

## 2015-04-05 DIAGNOSIS — D539 Nutritional anemia, unspecified: Secondary | ICD-10-CM | POA: Diagnosis not present

## 2015-04-05 DIAGNOSIS — E785 Hyperlipidemia, unspecified: Secondary | ICD-10-CM | POA: Diagnosis not present

## 2015-04-24 DIAGNOSIS — K7469 Other cirrhosis of liver: Secondary | ICD-10-CM | POA: Diagnosis not present

## 2015-04-24 DIAGNOSIS — N200 Calculus of kidney: Secondary | ICD-10-CM | POA: Diagnosis not present

## 2015-04-24 DIAGNOSIS — B1920 Unspecified viral hepatitis C without hepatic coma: Secondary | ICD-10-CM | POA: Diagnosis not present

## 2015-04-24 DIAGNOSIS — N281 Cyst of kidney, acquired: Secondary | ICD-10-CM | POA: Diagnosis not present

## 2015-04-24 DIAGNOSIS — K7460 Unspecified cirrhosis of liver: Secondary | ICD-10-CM | POA: Diagnosis not present

## 2015-05-24 DIAGNOSIS — E785 Hyperlipidemia, unspecified: Secondary | ICD-10-CM | POA: Diagnosis not present

## 2015-05-24 DIAGNOSIS — I2510 Atherosclerotic heart disease of native coronary artery without angina pectoris: Secondary | ICD-10-CM | POA: Diagnosis not present

## 2015-05-24 DIAGNOSIS — I421 Obstructive hypertrophic cardiomyopathy: Secondary | ICD-10-CM | POA: Diagnosis not present

## 2015-05-24 DIAGNOSIS — I10 Essential (primary) hypertension: Secondary | ICD-10-CM | POA: Diagnosis not present

## 2015-05-24 DIAGNOSIS — R197 Diarrhea, unspecified: Secondary | ICD-10-CM | POA: Diagnosis not present

## 2015-05-24 DIAGNOSIS — Z6827 Body mass index (BMI) 27.0-27.9, adult: Secondary | ICD-10-CM | POA: Diagnosis not present

## 2015-05-25 DIAGNOSIS — R197 Diarrhea, unspecified: Secondary | ICD-10-CM | POA: Diagnosis not present

## 2015-06-07 DIAGNOSIS — Z471 Aftercare following joint replacement surgery: Secondary | ICD-10-CM | POA: Diagnosis not present

## 2015-06-07 DIAGNOSIS — Z96652 Presence of left artificial knee joint: Secondary | ICD-10-CM | POA: Diagnosis not present

## 2015-06-07 DIAGNOSIS — M25562 Pain in left knee: Secondary | ICD-10-CM | POA: Diagnosis not present

## 2015-07-05 DIAGNOSIS — D539 Nutritional anemia, unspecified: Secondary | ICD-10-CM | POA: Diagnosis not present

## 2015-07-05 DIAGNOSIS — E119 Type 2 diabetes mellitus without complications: Secondary | ICD-10-CM | POA: Diagnosis not present

## 2015-07-05 DIAGNOSIS — F5104 Psychophysiologic insomnia: Secondary | ICD-10-CM | POA: Diagnosis not present

## 2015-07-05 DIAGNOSIS — I10 Essential (primary) hypertension: Secondary | ICD-10-CM | POA: Diagnosis not present

## 2015-07-05 DIAGNOSIS — Z6826 Body mass index (BMI) 26.0-26.9, adult: Secondary | ICD-10-CM | POA: Diagnosis not present

## 2015-07-05 DIAGNOSIS — E663 Overweight: Secondary | ICD-10-CM | POA: Diagnosis not present

## 2015-07-05 DIAGNOSIS — E785 Hyperlipidemia, unspecified: Secondary | ICD-10-CM | POA: Diagnosis not present

## 2015-10-11 DIAGNOSIS — E119 Type 2 diabetes mellitus without complications: Secondary | ICD-10-CM | POA: Diagnosis not present

## 2015-10-11 DIAGNOSIS — Z125 Encounter for screening for malignant neoplasm of prostate: Secondary | ICD-10-CM | POA: Diagnosis not present

## 2015-10-11 DIAGNOSIS — D539 Nutritional anemia, unspecified: Secondary | ICD-10-CM | POA: Diagnosis not present

## 2015-10-11 DIAGNOSIS — F5104 Psychophysiologic insomnia: Secondary | ICD-10-CM | POA: Diagnosis not present

## 2015-10-11 DIAGNOSIS — E785 Hyperlipidemia, unspecified: Secondary | ICD-10-CM | POA: Diagnosis not present

## 2015-10-11 DIAGNOSIS — N401 Enlarged prostate with lower urinary tract symptoms: Secondary | ICD-10-CM | POA: Diagnosis not present

## 2015-10-11 DIAGNOSIS — I10 Essential (primary) hypertension: Secondary | ICD-10-CM | POA: Diagnosis not present

## 2015-10-30 DIAGNOSIS — B182 Chronic viral hepatitis C: Secondary | ICD-10-CM | POA: Diagnosis not present

## 2015-10-30 DIAGNOSIS — K7469 Other cirrhosis of liver: Secondary | ICD-10-CM | POA: Diagnosis not present

## 2015-10-30 DIAGNOSIS — K7460 Unspecified cirrhosis of liver: Secondary | ICD-10-CM | POA: Diagnosis not present

## 2015-10-30 DIAGNOSIS — B1920 Unspecified viral hepatitis C without hepatic coma: Secondary | ICD-10-CM | POA: Diagnosis not present

## 2015-11-09 DIAGNOSIS — Z0000 Encounter for general adult medical examination without abnormal findings: Secondary | ICD-10-CM | POA: Diagnosis not present

## 2015-11-09 DIAGNOSIS — E119 Type 2 diabetes mellitus without complications: Secondary | ICD-10-CM | POA: Diagnosis not present

## 2015-11-16 DIAGNOSIS — K7460 Unspecified cirrhosis of liver: Secondary | ICD-10-CM | POA: Diagnosis not present

## 2015-11-16 DIAGNOSIS — B182 Chronic viral hepatitis C: Secondary | ICD-10-CM | POA: Diagnosis not present

## 2015-11-16 DIAGNOSIS — R911 Solitary pulmonary nodule: Secondary | ICD-10-CM | POA: Diagnosis not present

## 2015-12-03 DIAGNOSIS — H2513 Age-related nuclear cataract, bilateral: Secondary | ICD-10-CM | POA: Diagnosis not present

## 2015-12-03 DIAGNOSIS — E119 Type 2 diabetes mellitus without complications: Secondary | ICD-10-CM | POA: Diagnosis not present

## 2015-12-03 DIAGNOSIS — H524 Presbyopia: Secondary | ICD-10-CM | POA: Diagnosis not present

## 2015-12-05 DIAGNOSIS — I2510 Atherosclerotic heart disease of native coronary artery without angina pectoris: Secondary | ICD-10-CM | POA: Diagnosis not present

## 2015-12-05 DIAGNOSIS — I421 Obstructive hypertrophic cardiomyopathy: Secondary | ICD-10-CM | POA: Diagnosis not present

## 2015-12-05 DIAGNOSIS — I10 Essential (primary) hypertension: Secondary | ICD-10-CM | POA: Diagnosis not present

## 2015-12-05 DIAGNOSIS — Z6827 Body mass index (BMI) 27.0-27.9, adult: Secondary | ICD-10-CM | POA: Diagnosis not present

## 2015-12-05 DIAGNOSIS — E785 Hyperlipidemia, unspecified: Secondary | ICD-10-CM | POA: Diagnosis not present

## 2016-01-10 DIAGNOSIS — L089 Local infection of the skin and subcutaneous tissue, unspecified: Secondary | ICD-10-CM | POA: Diagnosis not present

## 2016-01-10 DIAGNOSIS — W57XXXA Bitten or stung by nonvenomous insect and other nonvenomous arthropods, initial encounter: Secondary | ICD-10-CM | POA: Diagnosis not present

## 2016-01-24 DIAGNOSIS — N401 Enlarged prostate with lower urinary tract symptoms: Secondary | ICD-10-CM | POA: Diagnosis not present

## 2016-01-24 DIAGNOSIS — E785 Hyperlipidemia, unspecified: Secondary | ICD-10-CM | POA: Diagnosis not present

## 2016-01-24 DIAGNOSIS — I10 Essential (primary) hypertension: Secondary | ICD-10-CM | POA: Diagnosis not present

## 2016-01-24 DIAGNOSIS — E119 Type 2 diabetes mellitus without complications: Secondary | ICD-10-CM | POA: Diagnosis not present

## 2016-01-24 DIAGNOSIS — E663 Overweight: Secondary | ICD-10-CM | POA: Diagnosis not present

## 2016-02-14 DIAGNOSIS — N401 Enlarged prostate with lower urinary tract symptoms: Secondary | ICD-10-CM | POA: Diagnosis not present

## 2016-02-14 DIAGNOSIS — R351 Nocturia: Secondary | ICD-10-CM | POA: Diagnosis not present

## 2016-02-14 DIAGNOSIS — N3020 Other chronic cystitis without hematuria: Secondary | ICD-10-CM | POA: Diagnosis not present

## 2016-02-14 DIAGNOSIS — N318 Other neuromuscular dysfunction of bladder: Secondary | ICD-10-CM | POA: Diagnosis not present

## 2016-05-28 DIAGNOSIS — I10 Essential (primary) hypertension: Secondary | ICD-10-CM | POA: Diagnosis not present

## 2016-05-28 DIAGNOSIS — F5104 Psychophysiologic insomnia: Secondary | ICD-10-CM | POA: Diagnosis not present

## 2016-05-28 DIAGNOSIS — D539 Nutritional anemia, unspecified: Secondary | ICD-10-CM | POA: Diagnosis not present

## 2016-05-28 DIAGNOSIS — Z1389 Encounter for screening for other disorder: Secondary | ICD-10-CM | POA: Diagnosis not present

## 2016-05-28 DIAGNOSIS — Z9181 History of falling: Secondary | ICD-10-CM | POA: Diagnosis not present

## 2016-05-28 DIAGNOSIS — E119 Type 2 diabetes mellitus without complications: Secondary | ICD-10-CM | POA: Diagnosis not present

## 2016-05-28 DIAGNOSIS — E785 Hyperlipidemia, unspecified: Secondary | ICD-10-CM | POA: Diagnosis not present

## 2016-05-29 DIAGNOSIS — I10 Essential (primary) hypertension: Secondary | ICD-10-CM | POA: Diagnosis not present

## 2016-05-29 DIAGNOSIS — E785 Hyperlipidemia, unspecified: Secondary | ICD-10-CM | POA: Diagnosis not present

## 2016-05-29 DIAGNOSIS — I25119 Atherosclerotic heart disease of native coronary artery with unspecified angina pectoris: Secondary | ICD-10-CM | POA: Diagnosis not present

## 2016-05-29 DIAGNOSIS — I421 Obstructive hypertrophic cardiomyopathy: Secondary | ICD-10-CM | POA: Diagnosis not present

## 2016-05-29 DIAGNOSIS — Z6827 Body mass index (BMI) 27.0-27.9, adult: Secondary | ICD-10-CM | POA: Diagnosis not present

## 2016-06-10 DIAGNOSIS — S3091XA Unspecified superficial injury of lower back and pelvis, initial encounter: Secondary | ICD-10-CM | POA: Diagnosis not present

## 2016-06-10 DIAGNOSIS — W57XXXA Bitten or stung by nonvenomous insect and other nonvenomous arthropods, initial encounter: Secondary | ICD-10-CM | POA: Diagnosis not present

## 2016-06-10 DIAGNOSIS — Z6827 Body mass index (BMI) 27.0-27.9, adult: Secondary | ICD-10-CM | POA: Diagnosis not present

## 2016-08-03 IMAGING — CT CT ANGIO CHEST
2 of 9 series · 17 of 46 positions shown · IV contrast (omnipaque)
Comparison: Chest radiograph July 12, 2014

ADDENDUM:
Critical Value/emergent results were called by telephone at the time
of interpretation on 07/13/2014 at [DATE] to Dr. Say, covering
physician, who verbally acknowledged these results.
CLINICAL DATA: Recent total knee replacement with difficulty
breathing and tachycardia

EXAM:
CT ANGIOGRAPHY CHEST WITH CONTRAST
TECHNIQUE: Multidetector CT imaging of the chest was performed using the
standard protocol during bolus administration of intravenous
contrast. Multiplanar CT image reconstructions and MIPs were
obtained to evaluate the vascular anatomy.
CONTRAST:  80mL OMNIPAQUE IOHEXOL 350 MG/ML SOLN

[Series 5: thins · axial · 0.65mm/px · z∈[-274,-50]mm · 14 of 254 slices shown]
[im 15/254  lung]
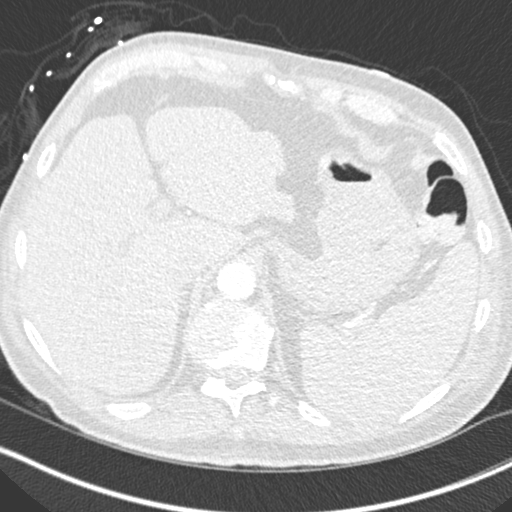
[im 29/254  soft-tissue]
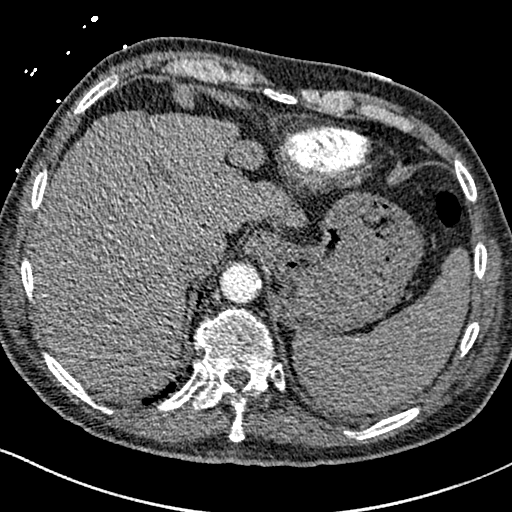
[im 57/254  lung]
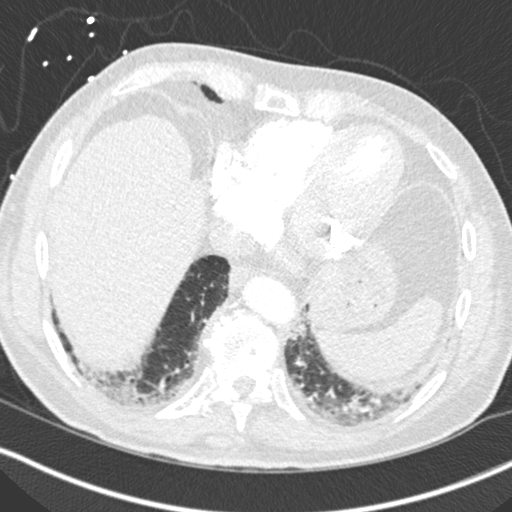
[im 71/254  soft-tissue]
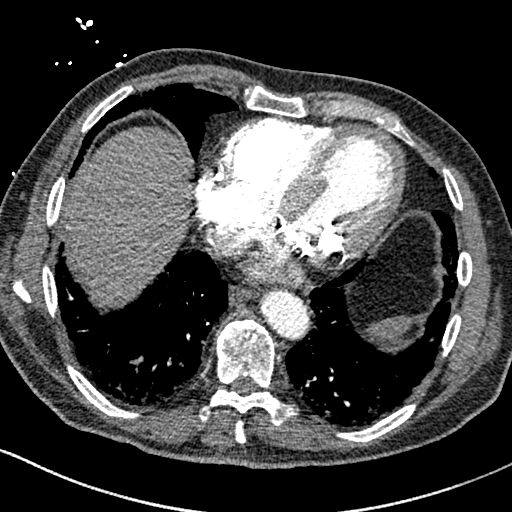
[im 85/254  lung]
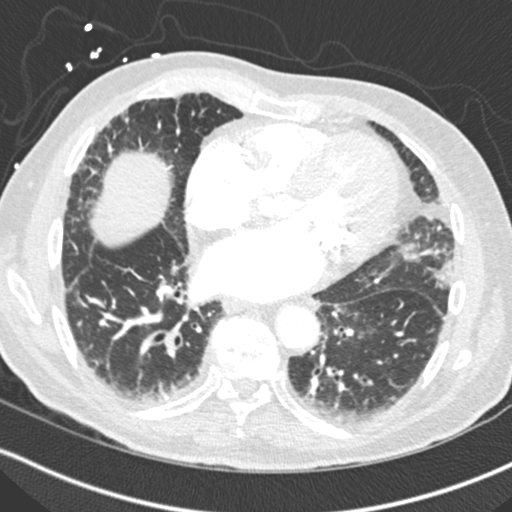
[im 99/254  soft-tissue]
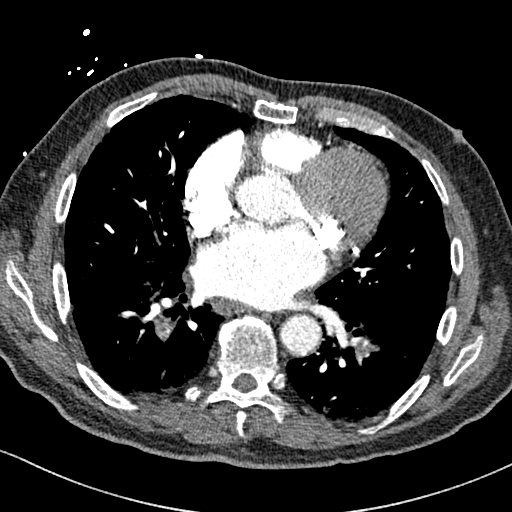
[im 113/254  lung]
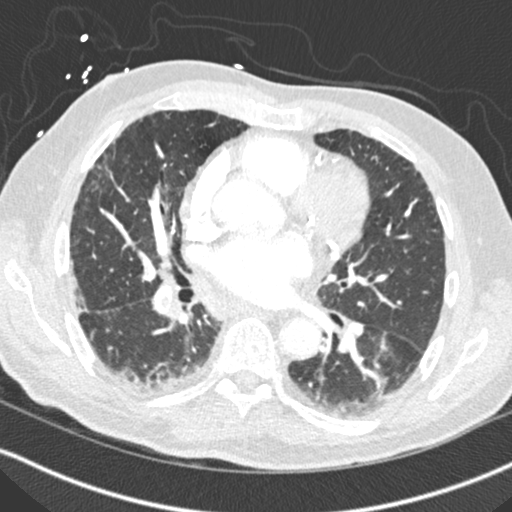
[im 141/254  soft-tissue]
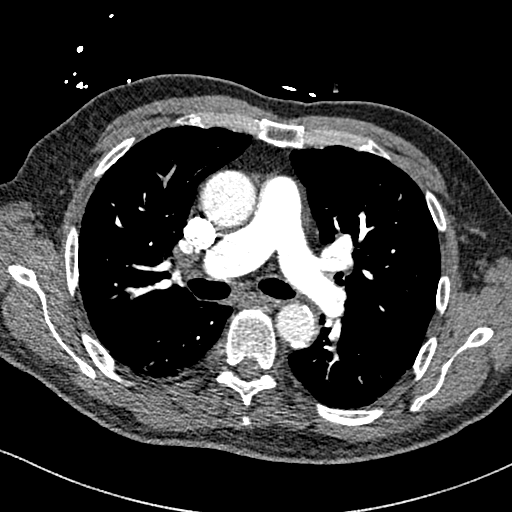
[im 155/254  lung]
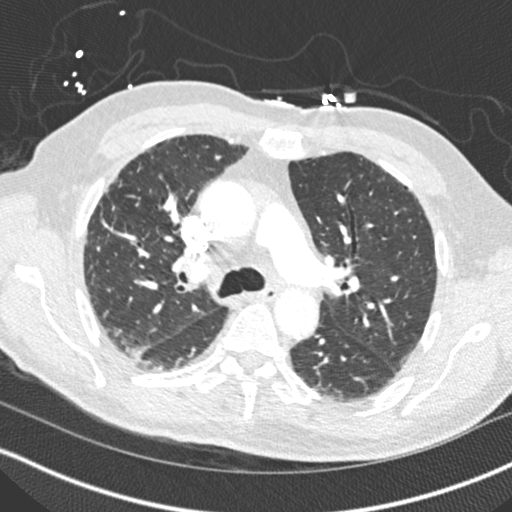
[im 169/254  soft-tissue]
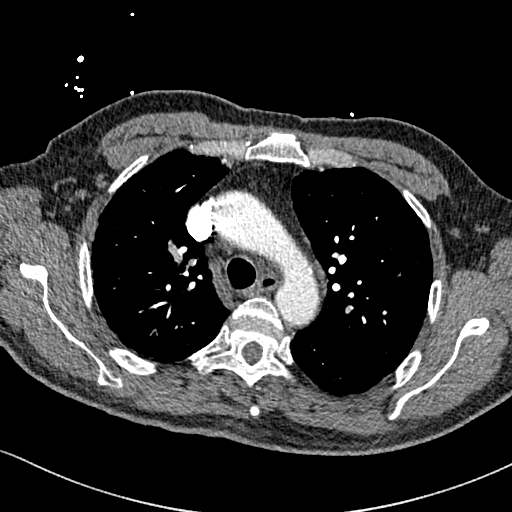
[im 183/254  lung]
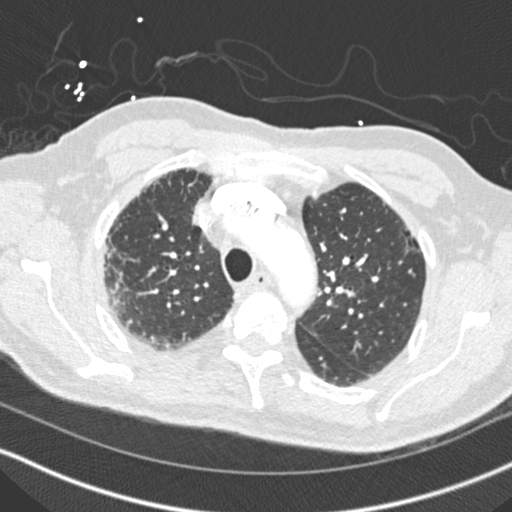
[im 197/254  soft-tissue]
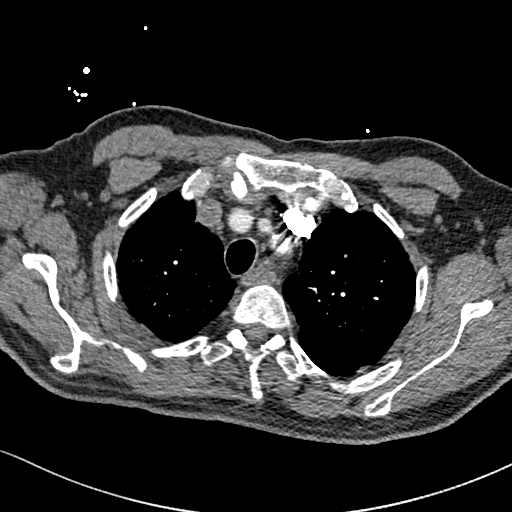
[im 225/254  lung]
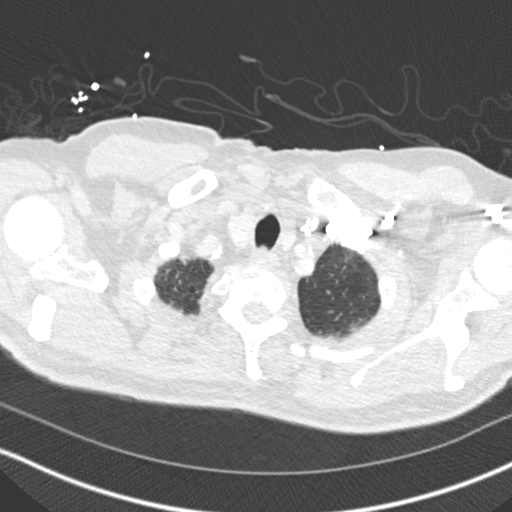
[im 239/254  soft-tissue]
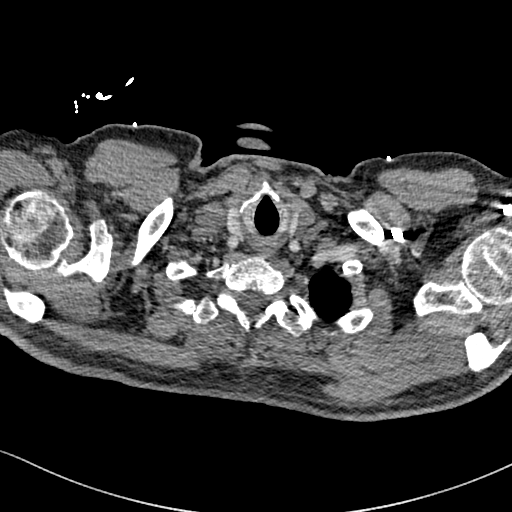

[Series 7: coronal mpr · coronal · 0.53mm/px · 3 of 131 slices shown]
[im 33/131  soft-tissue]
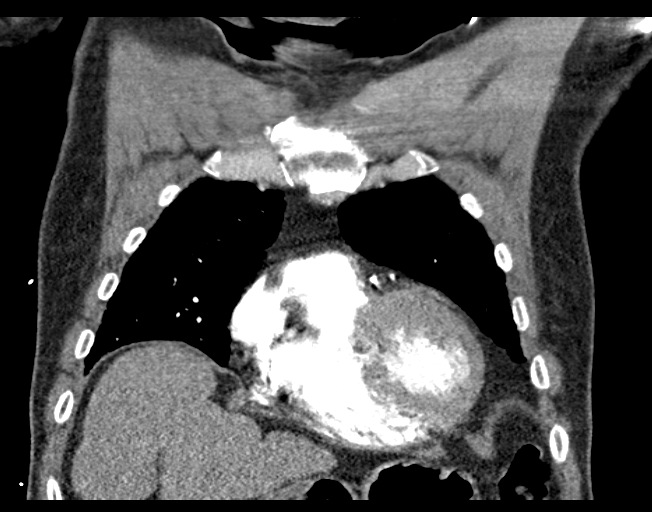
[im 66/131  soft-tissue]
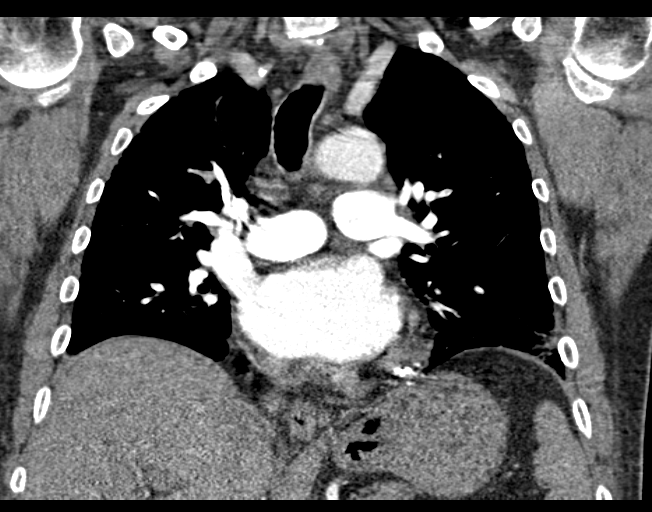
[im 98/131  soft-tissue]
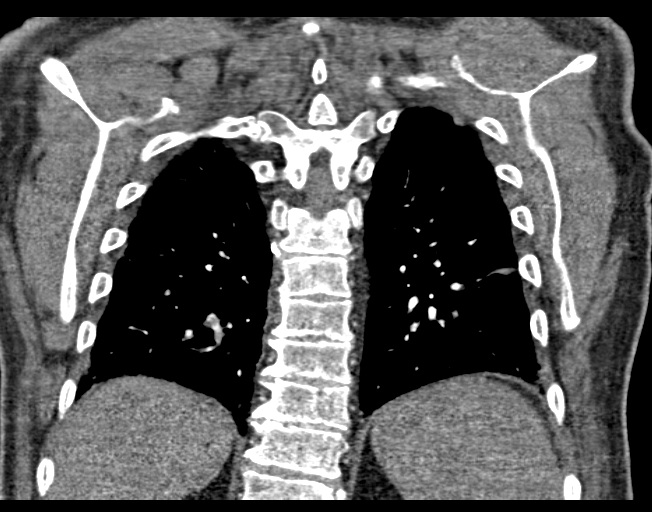

[17 of 46 positions shown; findings below may reference images not displayed]

FINDINGS: There is pulmonary embolism involving multiple lower lobe branch
vessels bilaterally, somewhat more severe on the right than on the
left. There is also a pulmonary embolus in the posterior segment
left upper lobe branch. Pulmonary embolus arises from the distal
posterior most aspect of the right main pulmonary artery. The right
ventricle to left ventricle diameter ratio is 0.9, borderline for
right heart strain.

There is atherosclerotic change in the aorta without aneurysm or
dissection.

There are multiple foci of coronary artery calcification,
particularly in the left anterior descending coronary artery.

There is patchy atelectatic change in both lower lobes. A small area
of infiltrate is noted in the inferior lingula.

There is no appreciable thoracic adenopathy. Thyroid appears
unremarkable. The pericardium is not thickened.

In the visualized upper abdomen, no lesions are identified.

There are no blastic or lytic bone lesions. There is degenerative
change in the thoracic spine.

Review of the MIP images confirms the above findings.
IMPRESSION: Positive for acute PE with borderline CT evidence of right heart
strain (RV/LV Ratio = 0.9) consistent with at least submassive
(intermediate risk) PE. The presence of right heart strain has been
associated with an increased risk of morbidity and mortality. Please
activate Code PE by paging [phone]. 1 pulmonary emboli are
primarily located in the lower lobe branches bilaterally, although
there is a pulmonary embolus in a left upper lobe posterior segment
branch.

Small area of infiltrate in the inferior lingula. Areas of patchy
atelectasis bilaterally.

No adenopathy.

## 2016-09-24 DIAGNOSIS — H9201 Otalgia, right ear: Secondary | ICD-10-CM | POA: Diagnosis not present

## 2016-09-24 DIAGNOSIS — H6121 Impacted cerumen, right ear: Secondary | ICD-10-CM | POA: Diagnosis not present

## 2016-10-14 DIAGNOSIS — Z125 Encounter for screening for malignant neoplasm of prostate: Secondary | ICD-10-CM | POA: Diagnosis not present

## 2016-10-14 DIAGNOSIS — Z1389 Encounter for screening for other disorder: Secondary | ICD-10-CM | POA: Diagnosis not present

## 2016-10-14 DIAGNOSIS — Z9181 History of falling: Secondary | ICD-10-CM | POA: Diagnosis not present

## 2016-10-14 DIAGNOSIS — E785 Hyperlipidemia, unspecified: Secondary | ICD-10-CM | POA: Diagnosis not present

## 2016-10-14 DIAGNOSIS — Z136 Encounter for screening for cardiovascular disorders: Secondary | ICD-10-CM | POA: Diagnosis not present

## 2016-10-14 DIAGNOSIS — Z0000 Encounter for general adult medical examination without abnormal findings: Secondary | ICD-10-CM | POA: Diagnosis not present

## 2016-10-16 DIAGNOSIS — I10 Essential (primary) hypertension: Secondary | ICD-10-CM | POA: Diagnosis not present

## 2016-10-16 DIAGNOSIS — D539 Nutritional anemia, unspecified: Secondary | ICD-10-CM | POA: Diagnosis not present

## 2016-10-16 DIAGNOSIS — F5104 Psychophysiologic insomnia: Secondary | ICD-10-CM | POA: Diagnosis not present

## 2016-10-16 DIAGNOSIS — Z6826 Body mass index (BMI) 26.0-26.9, adult: Secondary | ICD-10-CM | POA: Diagnosis not present

## 2016-10-16 DIAGNOSIS — E119 Type 2 diabetes mellitus without complications: Secondary | ICD-10-CM | POA: Diagnosis not present

## 2016-10-16 DIAGNOSIS — Z125 Encounter for screening for malignant neoplasm of prostate: Secondary | ICD-10-CM | POA: Diagnosis not present

## 2016-10-16 DIAGNOSIS — E785 Hyperlipidemia, unspecified: Secondary | ICD-10-CM | POA: Diagnosis not present

## 2016-12-17 DIAGNOSIS — H35372 Puckering of macula, left eye: Secondary | ICD-10-CM | POA: Diagnosis not present

## 2016-12-17 DIAGNOSIS — E119 Type 2 diabetes mellitus without complications: Secondary | ICD-10-CM | POA: Diagnosis not present

## 2016-12-17 DIAGNOSIS — H2513 Age-related nuclear cataract, bilateral: Secondary | ICD-10-CM | POA: Diagnosis not present

## 2016-12-25 DIAGNOSIS — N3020 Other chronic cystitis without hematuria: Secondary | ICD-10-CM | POA: Diagnosis not present

## 2016-12-25 DIAGNOSIS — R351 Nocturia: Secondary | ICD-10-CM | POA: Diagnosis not present

## 2016-12-25 DIAGNOSIS — K4090 Unilateral inguinal hernia, without obstruction or gangrene, not specified as recurrent: Secondary | ICD-10-CM | POA: Diagnosis not present

## 2016-12-25 DIAGNOSIS — N401 Enlarged prostate with lower urinary tract symptoms: Secondary | ICD-10-CM | POA: Diagnosis not present

## 2016-12-26 DIAGNOSIS — E119 Type 2 diabetes mellitus without complications: Secondary | ICD-10-CM | POA: Insufficient documentation

## 2016-12-26 DIAGNOSIS — Z205 Contact with and (suspected) exposure to viral hepatitis: Secondary | ICD-10-CM

## 2016-12-26 DIAGNOSIS — I2510 Atherosclerotic heart disease of native coronary artery without angina pectoris: Secondary | ICD-10-CM | POA: Insufficient documentation

## 2016-12-26 HISTORY — DX: Contact with and (suspected) exposure to viral hepatitis: Z20.5

## 2016-12-29 DIAGNOSIS — Z01810 Encounter for preprocedural cardiovascular examination: Secondary | ICD-10-CM

## 2016-12-29 HISTORY — DX: Encounter for preprocedural cardiovascular examination: Z01.810

## 2016-12-29 NOTE — Progress Notes (Signed)
Cardiology Office Note:    Date:  12/30/2016   ID:  Christian Patton, DOB [birth year], MRN [id number]  PCP:  Lowella Dandy, NP  Cardiologist:  Shirlee More, MD    Referring MD: Lowella Dandy, NP    ASSESSMENT:    1. Preoperative cardiovascular examination   2. Other pulmonary embolism without acute cor pulmonale, unspecified chronicity ([place])   3. Obstructive hypertrophic cardiomyopathy ([place])   4. Atherosclerosis of native coronary artery of native heart without angina pectoris   5. Hypertensive heart disease with heart failure [facility])    PLAN:    Preoperative cardiovascular evaluation Surgeon: Dr Comer Locket Procedure: urolift and hernia repair The surgery is elective Active cardiac problems CAD, obstructive hypertrophic cardiomyopathy with provokable but not resting left ventricular outflow tract gradient and previous heart failure presently asymptomatic and no evidence of volume overload. The cardiac status is stable. The planned procedure is low to intermediate risk. 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 Given the above his overall risk for the planned procedure is low/acceptable Antiplatelet/ anticoagulant recommendation: He will stop aspirin 5 days preoperatively please tell him when to resume postoperatively Other cardiac medication or device recommendation: I asked his wife to purchase surgical compression hose that he will wear for 7 days after surgery Anesthesia recommendation: None Observation, monitoring,and postoperative test recommendation: None anticipated outpatient, please avoid hypotension and direct arterial vasodilators that can provoke left ventricular outflow tract obstruction The patient is optimized from a cardiology perspective: Yes  In  order of problems listed above:  1. Proceed with planned surgery he is optimized 2. The procedure is low risk for venous thromboembolism I will ask his wife to purchase compression hose and I will ask him to wear them for the first week after surgery.  I do not think he requires anticoagulant therapy as he will be immediately mobilized 3. Stable, avoid hypotension and direct arterial vasodilators that can provoke left ventricular outflow tract obstruction 4. Stable continue usual cardiac medications he will withdraw aspirin preoperatively and please instruct the patient when he can resume after surgery 5. Stable blood pressure at target he takes a thiazide diuretic as needed and no longer requires a loop diuretic. 6. His hyperlipidemia is stable continue his statin   Next appointment: July 2019   Medication Adjustments/Labs and Tests Ordered: Current medicines are reviewed at length with the patient today.  Concerns regarding medicines are outlined above.  Orders Placed This Encounter  Procedures  . EKG 12-Lead   No orders of the defined types were placed in this encounter.   Chief Complaint  Patient presents with  . Pre-op Exam  . Coronary Artery Disease  . Congestive Heart Failure  . Hyperlipidemia  . Cardiomyopathy    History of Present Illness:    Christian Patton is a 76 y.o. male with a hx of obstructive hypertrophic cardiomyopathy, CAD, Heart failure,Dyslipidemia, HTN, S/P PCI of RCA with DES in 2005 and 2011 , pulmonary embolism with TKA, and hepatitis C in remission  last seen in April 2018.Marland Kitchen Compliance with diet, lifestyle and medications: Yes He remains a very vigorous active man doing performing exercise tolerance greater than 7-10 minutes and has had no angina shortness of breath palpitations syncope or TIA.  He takes a thiazide diuretic as needed  infrequently for elevated blood pressure no longer requires a loop diuretic for his diastolic heart failure and has no resting  left ventricular outflow tract obstruction. Past Medical History:  Diagnosis Date  . Acute renal failure ([place]) 07/12/2014  . Arthralgia of both knees 06/15/2014  . Arthritis   . Atherosclerotic heart disease of native coronary artery without angina pectoris 09-15-2014   Overview:  Stented x 3; 2012;  cardiac cath clean arteries, 2014 Overview:  Overview:  Stented x 3; 2012;  cardiac cath clean arteries, 2014  . Chronic anticoagulation Sep 15, 2014  . 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 34.9cm/sec               No mass, no ascites Labs- thrombocytopenia Overview:  Overview:  genotype 1a; Stage II fibrosis, Bx. 2005; Tx'd with Harvoni x 24 weeks, 2015  . Coronary artery disease   . Diabetes mellitus without complication ([place])    TYPE 2  . Essential hypertension September 15, 2014   Overview:  on meds Overview:  Overview:  on meds  . Exposure to hepatitis C 12/26/2016   Overview:  Diagnosed 1995 Genotype 1a Liver Bx- Stage II fibrosis, 2005 Treated with IFN + RBV x 24 weeks, no response, 2002  continued additional 2 years-2005 Re-treated with Harvoni, 24 weeks, 2015   Sustained viral eradication  Risks-blood transfusion, UGI bleed, perforated duodenal ulcer, 1966  . 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 Hepatitis    C       "Has taken Harvoni"  . History of other specified conditions presenting hazards to health 09/15/2014   Overview:  Overview:  perforated gastric ulcer  . Hypertension   . 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 Obstructive hypertrophic cardiomyopathy ([place]) September 15, 2014  . Primary osteoarthritis of left knee 06/23/2014  . Pulmonary embolism without acute cor pulmonale ([place]) 09-15-14  . Sudden cardiac death ([place]) 15-Sep-2014  . Urine retention  07/12/2014    Past Surgical History:  Procedure Laterality Date  . CARDIAC CATHETERIZATION     x 3    Last one was in 2011 @ [place]    . TOTAL KNEE ARTHROPLASTY Left 07/03/2014   Procedure: LEFT TOTAL KNEE ARTHOPLASTY;  Surgeon: Vickey Huger, MD;  Location: [facility];  Service: Orthopedics;  Laterality: Left;    Current Medications: Current Meds  Medication Sig  . aspirin EC 81 MG EC tablet Take 1 tablet (81 mg total) by mouth daily.  . Calcium Carb-Cholecalciferol (CALCIUM-VITAMIN D) 500-200 MG-UNIT tablet Take 1 tablet by mouth daily.  . Fenugreek 610 MG CAPS Take 1 tablet by mouth daily.  Marland Kitchen glipiZIDE (GLUCOTROL XL) 5 MG 24 hr tablet Take 1 tablet by mouth as needed. Take only when sugar is above 120.  . hydrochlorothiazide (HYDRODIURIL) 25 MG tablet Take 25 mg by mouth daily. If blood pressure is over 140/80  . MAGNESIUM GLYCINATE PLUS PO Take 1 tablet by mouth daily.  . Melatonin 10 MG TABS Take 1 tablet by mouth daily.  . metoprolol tartrate (LOPRESSOR) 25 MG tablet Take 25 mg by mouth daily.  . Multiple Vitamin (MULTIVITAMIN) tablet Take 1 tablet by mouth daily.  . QUEtiapine (SEROQUEL) 25 MG tablet Take 50 mg by mouth  at bedtime.   . rosuvastatin (CRESTOR) 5 MG tablet Take 5 mg by mouth daily.  . Saw Palmetto 450 MG CAPS Take 1 capsule by mouth daily.     Allergies:   Metformin and Methocarbamol   Social History   Socioeconomic History  . Marital status: Married    Spouse name: None  . Number of children: None  . Years of education: None  . Highest education level: None  Social Needs  . Financial resource strain: None  . Food insecurity - worry: None  . Food insecurity - inability: None  . Transportation needs - medical: None  . Transportation needs - non-medical: None  Occupational History  . None  Tobacco Use  . Smoking status: Former Smoker    Packs/day: 1.50    Years: 24.00    Pack years: 36.00    Types: Cigarettes    Last attempt  to quit: 1930    Years since quitting: 88.9  . Smokeless tobacco: Former Systems developer    Types: Chew  Substance and Sexual Activity  . Alcohol use: No    Comment: quit 30 yrs ago  . Drug use: No  . Sexual activity: None  Other Topics Concern  . None  Social History Narrative  . None     Family History: The patient's family history includes Heart attack in his father and mother. ROS:   Please see the history of present illness.    All other systems reviewed and are negative.  EKGs/Labs/Other Studies Reviewed:    The following studies were reviewed today:  EKG:  EKG ordered today.  The ekg ordered today demonstrates stable pattern sinus rhythm left anterior hemiblock old anteroseptal MI.  I gave the patient a copy of his EKG to hand carry for anesthesia. Recent labs requested from Laverna Peace NP  Recent Labs: No results found for requested labs within last 8760 hours.  Recent Lipid Panel No results found for: CHOL, TRIG, HDL, CHOLHDL, VLDL, LDLCALC, LDLDIRECT  Physical Exam:    VS:  BP (!) 156/90 (BP Location: Left Arm, Patient Position: Sitting)   Pulse 64   Ht _0  (1.6 m)   Wt 160 lb 12.8 oz (72.9 kg)   SpO2 98%   BMI 28.48 kg/m     Wt Readings from Last 3 Encounters:  12/30/16 160 lb 12.8 oz (72.9 kg)  07/14/14 170 lb (77.1 kg)  06/23/14 164 lb 12.8 oz (74.8 kg)     GEN:  Well nourished, well developed in no acute distress HEENT: Normal NECK: No JVD; No carotid bruits LYMPHATICS: No lymphadenopathy CARDIAC: No murmur on examination  RRR, no murmurs, rubs, gallops RESPIRATORY:  Clear to auscultation without rales, wheezing or rhonchi  ABDOMEN: Soft, non-tender, non-distended MUSCULOSKELETAL:  No edema; No deformity  SKIN: Warm and dry NEUROLOGIC:  Alert and oriented x 3 PSYCHIATRIC:  Normal affect    Signed, Shirlee More, MD  12/30/2016 4:20 PM    [facility]

## 2016-12-30 ENCOUNTER — Encounter: Payer: Self-pay | Admitting: Cardiology

## 2016-12-30 ENCOUNTER — Ambulatory Visit (INDEPENDENT_AMBULATORY_CARE_PROVIDER_SITE_OTHER): Payer: Medicare Other | Admitting: Cardiology

## 2016-12-30 VITALS — BP 156/90 | HR 64 | Ht 63.0 in | Wt 160.8 lb

## 2016-12-30 DIAGNOSIS — Z01810 Encounter for preprocedural cardiovascular examination: Secondary | ICD-10-CM

## 2016-12-30 DIAGNOSIS — I2510 Atherosclerotic heart disease of native coronary artery without angina pectoris: Secondary | ICD-10-CM

## 2016-12-30 DIAGNOSIS — I110 Hypertensive heart disease with heart failure: Secondary | ICD-10-CM

## 2016-12-30 DIAGNOSIS — I2699 Other pulmonary embolism without acute cor pulmonale: Secondary | ICD-10-CM | POA: Diagnosis not present

## 2016-12-30 DIAGNOSIS — I421 Obstructive hypertrophic cardiomyopathy: Secondary | ICD-10-CM

## 2016-12-30 NOTE — Patient Instructions (Signed)
Medication Instructions:  Your physician recommends that you continue on your current medications as directed. Please refer to the Current Medication list given to you today.  Labwork: None  Testing/Procedures: You had an EKG today.  Follow-Up: Your physician wants you to follow-up in: 8 months (July, 2019). You will receive a reminder letter in the mail two months in advance. If you don't receive a letter, please call our office to schedule the follow-up appointment.  Any Other Special Instructions Will Be Listed Below (If Applicable).     If you need a refill on your cardiac medications before your next appointment, please call your pharmacy.

## 2017-01-22 DIAGNOSIS — Z79899 Other long term (current) drug therapy: Secondary | ICD-10-CM | POA: Diagnosis not present

## 2017-01-22 DIAGNOSIS — Z87891 Personal history of nicotine dependence: Secondary | ICD-10-CM | POA: Diagnosis not present

## 2017-01-22 DIAGNOSIS — Z01818 Encounter for other preprocedural examination: Secondary | ICD-10-CM | POA: Diagnosis not present

## 2017-01-22 DIAGNOSIS — K219 Gastro-esophageal reflux disease without esophagitis: Secondary | ICD-10-CM | POA: Diagnosis not present

## 2017-01-22 DIAGNOSIS — I2510 Atherosclerotic heart disease of native coronary artery without angina pectoris: Secondary | ICD-10-CM | POA: Diagnosis not present

## 2017-01-22 DIAGNOSIS — E785 Hyperlipidemia, unspecified: Secondary | ICD-10-CM | POA: Diagnosis not present

## 2017-01-22 DIAGNOSIS — N138 Other obstructive and reflux uropathy: Secondary | ICD-10-CM | POA: Diagnosis not present

## 2017-01-22 DIAGNOSIS — R351 Nocturia: Secondary | ICD-10-CM | POA: Diagnosis not present

## 2017-01-22 DIAGNOSIS — I509 Heart failure, unspecified: Secondary | ICD-10-CM | POA: Diagnosis not present

## 2017-01-22 DIAGNOSIS — Z86711 Personal history of pulmonary embolism: Secondary | ICD-10-CM | POA: Diagnosis not present

## 2017-01-22 DIAGNOSIS — N401 Enlarged prostate with lower urinary tract symptoms: Secondary | ICD-10-CM | POA: Diagnosis not present

## 2017-01-22 DIAGNOSIS — Z01810 Encounter for preprocedural cardiovascular examination: Secondary | ICD-10-CM | POA: Diagnosis not present

## 2017-01-22 DIAGNOSIS — K4090 Unilateral inguinal hernia, without obstruction or gangrene, not specified as recurrent: Secondary | ICD-10-CM | POA: Diagnosis not present

## 2017-01-22 DIAGNOSIS — I43 Cardiomyopathy in diseases classified elsewhere: Secondary | ICD-10-CM | POA: Diagnosis not present

## 2017-01-22 DIAGNOSIS — I110 Hypertensive heart disease with heart failure: Secondary | ICD-10-CM | POA: Diagnosis not present

## 2017-01-22 DIAGNOSIS — E119 Type 2 diabetes mellitus without complications: Secondary | ICD-10-CM | POA: Diagnosis not present

## 2017-01-22 DIAGNOSIS — Z7982 Long term (current) use of aspirin: Secondary | ICD-10-CM | POA: Diagnosis not present

## 2017-01-22 DIAGNOSIS — Z955 Presence of coronary angioplasty implant and graft: Secondary | ICD-10-CM | POA: Diagnosis not present

## 2017-01-22 DIAGNOSIS — Z8619 Personal history of other infectious and parasitic diseases: Secondary | ICD-10-CM | POA: Diagnosis not present

## 2017-01-22 DIAGNOSIS — I340 Nonrheumatic mitral (valve) insufficiency: Secondary | ICD-10-CM | POA: Diagnosis not present

## 2017-01-22 DIAGNOSIS — Z7984 Long term (current) use of oral hypoglycemic drugs: Secondary | ICD-10-CM | POA: Diagnosis not present

## 2017-01-22 DIAGNOSIS — I429 Cardiomyopathy, unspecified: Secondary | ICD-10-CM | POA: Diagnosis not present

## 2017-01-23 ENCOUNTER — Telehealth: Payer: Self-pay | Admitting: Cardiology

## 2017-01-23 DIAGNOSIS — N401 Enlarged prostate with lower urinary tract symptoms: Secondary | ICD-10-CM | POA: Diagnosis not present

## 2017-01-23 DIAGNOSIS — K4090 Unilateral inguinal hernia, without obstruction or gangrene, not specified as recurrent: Secondary | ICD-10-CM | POA: Diagnosis not present

## 2017-01-23 DIAGNOSIS — R351 Nocturia: Secondary | ICD-10-CM | POA: Diagnosis not present

## 2017-01-23 NOTE — Telephone Encounter (Signed)
Christian Patton advised to hold metoprolol if systolic blood pressure is less than 100. Christian Patton verbalized understanding.

## 2017-01-23 NOTE — Telephone Encounter (Signed)
Hold metoprolol if < 100 BPM

## 2017-01-23 NOTE — Telephone Encounter (Signed)
Christian Patton states that patient had surgery yesterday, urolift and hernia repair. This morning at 6 am BP 97/55 and HR 77, at 8 am BP 91/52 and HR 73. Wife is concerned due to his blood pressure dropping and wants to know what to do if it gets any lower. Patient is not experiencing and symptoms at this time. Wife states that patient was in a lot of pain last night. Please advise.

## 2017-01-23 NOTE — Telephone Encounter (Signed)
BP really low, 97/55 and pulse 73

## 2017-01-30 DIAGNOSIS — N318 Other neuromuscular dysfunction of bladder: Secondary | ICD-10-CM | POA: Diagnosis not present

## 2017-02-16 DIAGNOSIS — E119 Type 2 diabetes mellitus without complications: Secondary | ICD-10-CM | POA: Diagnosis not present

## 2017-02-16 DIAGNOSIS — E785 Hyperlipidemia, unspecified: Secondary | ICD-10-CM | POA: Diagnosis not present

## 2017-02-16 DIAGNOSIS — Z6826 Body mass index (BMI) 26.0-26.9, adult: Secondary | ICD-10-CM | POA: Diagnosis not present

## 2017-02-16 DIAGNOSIS — I10 Essential (primary) hypertension: Secondary | ICD-10-CM | POA: Diagnosis not present

## 2017-02-16 DIAGNOSIS — K4090 Unilateral inguinal hernia, without obstruction or gangrene, not specified as recurrent: Secondary | ICD-10-CM | POA: Diagnosis not present

## 2017-02-16 DIAGNOSIS — D539 Nutritional anemia, unspecified: Secondary | ICD-10-CM | POA: Diagnosis not present

## 2017-02-20 DIAGNOSIS — K4090 Unilateral inguinal hernia, without obstruction or gangrene, not specified as recurrent: Secondary | ICD-10-CM | POA: Diagnosis not present

## 2017-02-20 DIAGNOSIS — N401 Enlarged prostate with lower urinary tract symptoms: Secondary | ICD-10-CM | POA: Diagnosis not present

## 2017-02-20 DIAGNOSIS — N3020 Other chronic cystitis without hematuria: Secondary | ICD-10-CM | POA: Diagnosis not present

## 2017-02-20 DIAGNOSIS — N318 Other neuromuscular dysfunction of bladder: Secondary | ICD-10-CM | POA: Diagnosis not present

## 2017-03-06 ENCOUNTER — Telehealth: Payer: Self-pay | Admitting: Cardiology

## 2017-03-06 NOTE — Telephone Encounter (Signed)
Patient was going in for dentures today at [facility].  208/117 is the highest the blood pressure was. Now at home, BP 154/86. Vira Agar states patient does not do what he is supposed to do. She states he will eat a jar of pickles or other foods that are very high in sodium.

## 2017-03-06 NOTE — Telephone Encounter (Signed)
If having dental take HCTZ the day of and day before

## 2017-03-06 NOTE — Telephone Encounter (Signed)
Went in for dental procedure today but his BP was extremely high so they didn't do the procedure/told him to call his cardiologist

## 2017-03-06 NOTE — Telephone Encounter (Signed)
Christian Patton advised for patient to take hydrochlorothiazide the day before the next dental procedure, and the day of the procedure. Verbalized understanding, no further questions.

## 2017-03-07 ENCOUNTER — Telehealth: Payer: Self-pay | Admitting: Physician Assistant

## 2017-03-07 NOTE — Telephone Encounter (Signed)
Christian Patton is a 76 y.o. male with a history of hypertrophic obstructive cardiomyopathy, CAD, hypertension.  His wife called into the answering service today with concerns of his blood pressure.  It has been elevated over the last few days.  He had a dental procedure canceled secondary to high blood pressure.  He is frustrated because of his dental procedure being canceled.  He has not had any chest pain, dizziness, headaches, shortness of breath.  He has been taking ibuprofen often. PLAN: 1.  I advised her to have him stop taking ibuprofen 2.  He can take Tylenol as needed 3.  He has been taking HCTZ if his blood pressure is elevated as directed-this can be continued (hold if < 140/90) 4.  Increase metoprolol succinate to 50 mg daily 5.  Go to ED if blood pressure high and patient developed symptoms 6.  Arrange follow-up next week Richardson Dopp, PA-C    03/07/2017 9:47 AM

## 2017-03-09 NOTE — Telephone Encounter (Signed)
Appointment made for 03/11/17 at 9:20 am. Vira Agar, wife, verbalized understanding of appointment date and time. No further questions.

## 2017-03-11 ENCOUNTER — Ambulatory Visit (INDEPENDENT_AMBULATORY_CARE_PROVIDER_SITE_OTHER): Payer: Medicare Other | Admitting: Cardiology

## 2017-03-11 ENCOUNTER — Encounter: Payer: Self-pay | Admitting: Cardiology

## 2017-03-11 VITALS — BP 164/90 | HR 63 | Ht 63.0 in | Wt 157.0 lb

## 2017-03-11 DIAGNOSIS — I110 Hypertensive heart disease with heart failure: Secondary | ICD-10-CM

## 2017-03-11 NOTE — Patient Instructions (Addendum)
Medication Instructions:  Your physician recommends that you continue on your current medications as directed. Please refer to the Current Medication list given to you today.  Labwork: None  Testing/Procedures: None  Follow-Up: Your physician recommends that you schedule a follow-up appointment in: You have a recall in for August, 2019.  Any Other Special Instructions Will Be Listed Below (If Applicable).     If you need a refill on your cardiac medications before your next appointment, please call your pharmacy.

## 2017-03-11 NOTE — Progress Notes (Signed)
Cardiology Office Note:    Date:  03/11/2017   ID:  Christian Patton, DOB August 01, 1940, MRN [id number]  PCP:  Christian Dandy, NP  Cardiologist:  Christian More, MD    Referring MD: Christian Dandy, NP    ASSESSMENT:    1. Hypertensive heart disease with heart failure ([place])    PLAN:    In order of problems listed above:  1. He will continue taking thiazide diuretic and beta-blocker daily in anticipation of planned denture fitting Friday at [facility]  The day of the procedure if his systolic is greater than 448 5 in the morning will take 2.5 mg of lisinopril a prescription he has at home is not allergic to.  His blood pressure typically with sodium restriction physical activity is normal to low he will continue to titrate his antihypertensives long-term as an outpatient.   Next appointment: 6 months   Medication Adjustments/Labs and Tests Ordered: Current medicines are reviewed at length with the patient today.  Concerns regarding medicines are outlined above.  No orders of the defined types were placed in this encounter.  No orders of the defined types were placed in this encounter.   Chief Complaint  Patient presents with  . Hypertension    History of Present Illness:    Christian Patton is a 77 y.o. male with obstructive HCOM and CADbeing seen as a work in for uncontrolled hypertension. Compliance with diet, lifestyle and medications: Yes He had a visit at [facility] for denture fitting was a very stressful day and they were lost difficulty parking the car walked a long distance and his systolic blood pressure was greater than 180.  They declined to do the procedure was made increasingly apprehensive he continued to take his antihypertensives and his blood pressure typically is in range.  He asked for advice with a strategy to allow him to have the planned procedure performed.  He has no chest pain exercise intolerance shortness of breath palpitation or syncope Past Medical  History:  Diagnosis Date  . Acute renal failure ([place]) 07/12/2014  . Arthralgia of both knees 06/15/2014  . Arthritis   . Atherosclerotic heart disease of native coronary artery without angina pectoris 09/13/2014   Overview:  Stented x 3; 2012;  cardiac cath clean arteries, 2014 Overview:  Overview:  Stented x 3; 2012;  cardiac cath clean arteries, 2014  . Chronic anticoagulation 09/13/2014  . 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 34.9cm/sec               No mass, no ascites Labs- thrombocytopenia Overview:  Overview:  genotype 1a; Stage II fibrosis, Bx. 2005; Tx'd with Harvoni x 24 weeks, 2015  . Coronary artery disease   . Diabetes mellitus without complication ([place])    TYPE 2  . Essential hypertension 09/13/2014   Overview:  on meds Overview:  Overview:  on meds  . Exposure to hepatitis C 12/26/2016   Overview:  Diagnosed 1995 Genotype 1a Liver Bx- Stage II fibrosis, 2005 Treated with IFN + RBV x 24 weeks, no response, 2002  continued additional 2 years-2005 Re-treated with Harvoni, 24 weeks, 2015   Sustained viral eradication  Risks-blood transfusion, UGI bleed, perforated duodenal ulcer, 1966  . 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 Hepatitis    C       "Has taken Harvoni"  . History of other specified conditions presenting hazards to health October 09, 2014   Overview:  Overview:  perforated gastric ulcer  . Hypertension   . 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 Obstructive hypertrophic cardiomyopathy ([place]) 2014/10/09  . Primary osteoarthritis of left knee 06/23/2014  . Pulmonary embolism without acute cor pulmonale ([place]) 10-09-2014  . Sudden cardiac death ([place]) Oct 09, 2014  . Urine retention 07/12/2014    Past Surgical History:  Procedure Laterality Date   . CARDIAC CATHETERIZATION     x 3    Last one was in 2011 @ [place]    . TOTAL KNEE ARTHROPLASTY Left 07/03/2014   Procedure: LEFT TOTAL KNEE ARTHOPLASTY;  Surgeon: Christian Huger, MD;  Location: [facility];  Service: Orthopedics;  Laterality: Left;    Current Medications: Current Meds  Medication Sig  . aspirin EC 81 MG EC tablet Take 1 tablet (81 mg total) by mouth daily.  . Calcium Carb-Cholecalciferol (CALCIUM-VITAMIN D) 500-200 MG-UNIT tablet Take 1 tablet by mouth daily.  . Fenugreek 610 MG CAPS Take 1 tablet by mouth daily.  Marland Kitchen glipiZIDE (GLUCOTROL XL) 5 MG 24 hr tablet Take 1 tablet by mouth as needed. Take only when sugar is above 120.  . hydrochlorothiazide (HYDRODIURIL) 25 MG tablet Take 25 mg by mouth daily. If blood pressure is over 140/80  . MAGNESIUM GLYCINATE PLUS PO Take 1 tablet by mouth daily.  . Melatonin 10 MG TABS Take 1 tablet by mouth daily.  . metoprolol tartrate (LOPRESSOR) 25 MG tablet Take 25 mg by mouth daily.  . multivitamin-iron-minerals-folic acid (THERAPEUTIC-M) TABS tablet Take 1 tablet by mouth daily.  . QUEtiapine (SEROQUEL) 25 MG tablet Take 50 mg by mouth at bedtime.   . rosuvastatin (CRESTOR) 5 MG tablet Take 5 mg by mouth daily.  . [DISCONTINUED] Multiple Vitamin (MULTIVITAMIN) tablet Take 1 tablet by mouth daily.  . [DISCONTINUED] Saw Palmetto 450 MG CAPS Take 1 capsule by mouth daily.     Allergies:   Metformin and Methocarbamol   Social History   Socioeconomic History  . Marital status: Married    Spouse name: None  . Number of children: None  . Years of education: None  . Highest education level: None  Social Needs  . Financial resource strain: None  . Food insecurity - worry: None  . Food insecurity - inability: None  . Transportation needs - medical: None  . Transportation needs - non-medical: None  Occupational History  . None  Tobacco Use  . Smoking status: Former Smoker    Packs/day: 1.50    Years:  24.00    Pack years: 36.00    Types: Cigarettes    Last attempt to quit: 1930    Years since quitting: 89.1  . Smokeless tobacco: Former Systems developer    Types: Chew  Substance and Sexual Activity  . Alcohol use: No    Comment: quit 30 yrs ago  . Drug use: No  . Sexual activity: None  Other Topics Concern  . None  Social History Narrative  . None     Family History: The patient's family history includes Heart attack in his father and mother. ROS:   Please see the history of present illness.    All other systems reviewed and are negative.  EKGs/Labs/Other Studies Reviewed:    The  following studies were reviewed today:  Recent Labs: No results found for requested labs within last 8760 hours.  Recent Lipid Panel No results found for: CHOL, TRIG, HDL, CHOLHDL, VLDL, LDLCALC, LDLDIRECT  Physical Exam:    VS:  BP (!) 164/90 (BP Location: Right Arm, Patient Position: Sitting, Cuff Size: Normal)   Pulse 63   Ht _0  (1.6 m)   Wt 157 lb (71.2 kg)   SpO2 94%   BMI 27.81 kg/m     Wt Readings from Last 3 Encounters:  03/11/17 157 lb (71.2 kg)  12/30/16 160 lb 12.8 oz (72.9 kg)  07/14/14 170 lb (77.1 kg)     GEN:  Well nourished, well developed in no acute distress HEENT: Normal NECK: No JVD; No carotid bruits LYMPHATICS: No lymphadenopathy CARDIAC: No murmur RRR, no murmurs, rubs, gallops RESPIRATORY:  Clear to auscultation without rales, wheezing or rhonchi  ABDOMEN: Soft, non-tender, non-distended MUSCULOSKELETAL:  No edema; No deformity  SKIN: Warm and dry NEUROLOGIC:  Alert and oriented x 3 PSYCHIATRIC:  Normal affect    Signed, Christian More, MD  03/11/2017 9:55 AM    [place]

## 2017-03-13 ENCOUNTER — Telehealth: Payer: Self-pay

## 2017-03-13 NOTE — Telephone Encounter (Signed)
Patient arrived for a denture fitting today and had an elevated blood pressure of 180/104 and 178/109. Cyril Mourning wanted to be sure that it was okay to proceed with exam and fitting. Reviewed with Dr. Bettina Gavia, advised okay to proceed. Kristen verbalized understanding, no further questions.

## 2017-03-16 ENCOUNTER — Telehealth: Payer: Self-pay | Admitting: Cardiology

## 2017-03-16 MED ORDER — ROSUVASTATIN CALCIUM 5 MG PO TABS: 5.0000 mg | ORAL_TABLET | Freq: Every day | ORAL | 3 refills | Status: DC

## 2017-03-16 NOTE — Telephone Encounter (Signed)
*  STAT* If patient is at the pharmacy, call can be transferred to refill team.   1. Which medications need to be refilled? (please list name of each medication and dose if known) Generic Crestor 71m 1 daily   2. Which pharmacy/location (including street and city if local pharmacy) is medication to be sent to? [facility]   3. Do they need a 30 day or 90 day supply?90 with refills

## 2017-03-16 NOTE — Telephone Encounter (Signed)
Refill sent.

## 2017-04-01 DIAGNOSIS — B1920 Unspecified viral hepatitis C without hepatic coma: Secondary | ICD-10-CM | POA: Diagnosis not present

## 2017-04-01 DIAGNOSIS — K2270 Barrett's esophagus without dysplasia: Secondary | ICD-10-CM | POA: Diagnosis not present

## 2017-04-01 DIAGNOSIS — K7460 Unspecified cirrhosis of liver: Secondary | ICD-10-CM | POA: Diagnosis not present

## 2017-04-01 DIAGNOSIS — K219 Gastro-esophageal reflux disease without esophagitis: Secondary | ICD-10-CM | POA: Diagnosis not present

## 2017-04-10 DIAGNOSIS — N318 Other neuromuscular dysfunction of bladder: Secondary | ICD-10-CM | POA: Diagnosis not present

## 2017-04-10 DIAGNOSIS — N3020 Other chronic cystitis without hematuria: Secondary | ICD-10-CM | POA: Diagnosis not present

## 2017-04-10 DIAGNOSIS — N401 Enlarged prostate with lower urinary tract symptoms: Secondary | ICD-10-CM | POA: Diagnosis not present

## 2017-04-10 DIAGNOSIS — K4090 Unilateral inguinal hernia, without obstruction or gangrene, not specified as recurrent: Secondary | ICD-10-CM | POA: Diagnosis not present

## 2017-06-16 DIAGNOSIS — I10 Essential (primary) hypertension: Secondary | ICD-10-CM | POA: Diagnosis not present

## 2017-06-16 DIAGNOSIS — D539 Nutritional anemia, unspecified: Secondary | ICD-10-CM | POA: Diagnosis not present

## 2017-06-16 DIAGNOSIS — E785 Hyperlipidemia, unspecified: Secondary | ICD-10-CM | POA: Diagnosis not present

## 2017-06-16 DIAGNOSIS — E119 Type 2 diabetes mellitus without complications: Secondary | ICD-10-CM | POA: Diagnosis not present

## 2017-06-16 DIAGNOSIS — F5104 Psychophysiologic insomnia: Secondary | ICD-10-CM | POA: Diagnosis not present

## 2017-10-19 DIAGNOSIS — Z125 Encounter for screening for malignant neoplasm of prostate: Secondary | ICD-10-CM | POA: Diagnosis not present

## 2017-10-19 DIAGNOSIS — Z1331 Encounter for screening for depression: Secondary | ICD-10-CM | POA: Diagnosis not present

## 2017-10-19 DIAGNOSIS — Z9181 History of falling: Secondary | ICD-10-CM | POA: Diagnosis not present

## 2017-10-19 DIAGNOSIS — E785 Hyperlipidemia, unspecified: Secondary | ICD-10-CM | POA: Diagnosis not present

## 2017-10-19 DIAGNOSIS — Z0000 Encounter for general adult medical examination without abnormal findings: Secondary | ICD-10-CM | POA: Diagnosis not present

## 2017-10-23 DIAGNOSIS — E119 Type 2 diabetes mellitus without complications: Secondary | ICD-10-CM | POA: Diagnosis not present

## 2017-10-23 DIAGNOSIS — I10 Essential (primary) hypertension: Secondary | ICD-10-CM | POA: Diagnosis not present

## 2017-10-23 DIAGNOSIS — Z1339 Encounter for screening examination for other mental health and behavioral disorders: Secondary | ICD-10-CM | POA: Diagnosis not present

## 2017-10-23 DIAGNOSIS — F5104 Psychophysiologic insomnia: Secondary | ICD-10-CM | POA: Diagnosis not present

## 2017-10-23 DIAGNOSIS — E785 Hyperlipidemia, unspecified: Secondary | ICD-10-CM | POA: Diagnosis not present

## 2018-01-22 DIAGNOSIS — Z6828 Body mass index (BMI) 28.0-28.9, adult: Secondary | ICD-10-CM | POA: Diagnosis not present

## 2018-01-22 DIAGNOSIS — J019 Acute sinusitis, unspecified: Secondary | ICD-10-CM | POA: Diagnosis not present

## 2018-01-27 ENCOUNTER — Encounter: Payer: Self-pay | Admitting: Cardiology

## 2018-01-27 ENCOUNTER — Ambulatory Visit (INDEPENDENT_AMBULATORY_CARE_PROVIDER_SITE_OTHER): Payer: Medicare Other | Admitting: Cardiology

## 2018-01-27 VITALS — BP 158/80 | HR 62 | Ht 64.0 in | Wt 164.0 lb

## 2018-01-27 DIAGNOSIS — I25118 Atherosclerotic heart disease of native coronary artery with other forms of angina pectoris: Secondary | ICD-10-CM

## 2018-01-27 DIAGNOSIS — I421 Obstructive hypertrophic cardiomyopathy: Secondary | ICD-10-CM

## 2018-01-27 DIAGNOSIS — R002 Palpitations: Secondary | ICD-10-CM | POA: Insufficient documentation

## 2018-01-27 DIAGNOSIS — I110 Hypertensive heart disease with heart failure: Secondary | ICD-10-CM | POA: Diagnosis not present

## 2018-01-27 HISTORY — DX: Palpitations: R00.2

## 2018-01-27 NOTE — Progress Notes (Signed)
Cardiology Office Note:    Date:  01/27/2018   ID:  Christian Patton, DOB 04/01/1940, MRN [id number]  PCP:  Lowella Dandy, NP  Cardiologist:  Shirlee More, MD    Referring MD: Lowella Dandy, NP    ASSESSMENT:    1. Obstructive hypertrophic cardiomyopathy ([place])   2. Palpitation   3. Hypertensive heart disease with heart failure ([place])   4. Coronary artery disease of native artery of native heart with stable angina pectoris ([place])    PLAN:    In order of problems listed above:  1. Stable no murmur on exam asymptomatic his gradient resolved with and will continue beta-blocker 2. Stable at this time I would not pursue a rhythm monitor as an outpatient, if it recurs he will contact me for an event monitor 3. Stable continue current antihypertensive look at recent labs for his renal function and potassium 4. Stable CAD New York Heart Association association class I continue current treatment aspirin high intensity statin beta-blocker and I do not think he requires an ischemia evaluation 5. Hyperlipidemia stable recent labs requested from his PCP   Next appointment: 1 year or sooner if he has recurrent arrhythmia or palpitation   Medication Adjustments/Labs and Tests Ordered: Current medicines are reviewed at length with the patient today.  Concerns regarding medicines are outlined above.  No orders of the defined types were placed in this encounter.  No orders of the defined types were placed in this encounter.   Chief Complaint  Patient presents with  . Palpitations    History of Present Illness:    Christian Patton is a 77 y.o. male with a hx of coronary artery disease with previous PCI in 2012 , obstructive hypertrophic cardiomyopathy with dynamic LV outflow tract obstruction, type 2 diabetes, hypertension with heart failure, hyperlipidemia and a history of pulmonary embolism following joint replacement surgery last seen by me  in January 2019. Compliance with diet, lifestyle and  medications: Yes  2 to 3 weeks.  Respiratory infection took Mucinex DM along with amoxicillin during this time his wife noted heart rates of less than 50 and a regular alarm on his blood pressure monitor since then it is resolved he feels well he said no palpitations syncope chest pain shortness of breath or edema.  He has a very vigorous active man has no exercise intolerance.  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  Recent labs from his PCP requested Past Medical History:  Diagnosis Date  . Acute renal failure ([place]) 07/12/2014  . Arthralgia of both knees 06/15/2014  . Arthritis   . Atherosclerotic heart disease of native coronary artery without angina pectoris 09/13/2014   Overview:  Stented x 3; 2012;  cardiac cath clean arteries, 2014 Overview:  Overview:  Stented x 3; 2012;  cardiac cath clean arteries, 2014  . Chronic anticoagulation 09/13/2014  . 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 34.9cm/sec               No mass, no ascites Labs- thrombocytopenia Overview:  Overview:  genotype 1a; Stage II fibrosis, Bx. 2005; Tx'd with Harvoni x 24 weeks, 2015  . Coronary artery disease   . Diabetes mellitus without complication ([place])  TYPE 2  . Essential hypertension 10-11-2014   Overview:  on meds Overview:  Overview:  on meds  . Exposure to hepatitis C 12/26/2016   Overview:  Diagnosed 1995 Genotype 1a Liver Bx- Stage II fibrosis, 2005 Treated with IFN + RBV x 24 weeks, no response, 2002  continued additional 2 years-2005 Re-treated with Harvoni, 24 weeks, 2015   Sustained viral eradication  Risks-blood transfusion, UGI bleed, perforated duodenal ulcer, 1966  . 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 Hepatitis    C       "Has taken  Harvoni"  . History of other specified conditions presenting hazards to health 2014-10-11   Overview:  Overview:  perforated gastric ulcer  . Hypertension   . 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 Obstructive hypertrophic cardiomyopathy ([place]) 2014/10/11  . Primary osteoarthritis of left knee 06/23/2014  . Pulmonary embolism without acute cor pulmonale ([place]) 10/11/14  . Sudden cardiac death ([place]) 10-11-14  . Urine retention 07/12/2014    Past Surgical History:  Procedure Laterality Date  . CARDIAC CATHETERIZATION     x 3    Last one was in 2011 @ [facility]    . TOTAL KNEE ARTHROPLASTY Left 07/03/2014   Procedure: LEFT TOTAL KNEE ARTHOPLASTY;  Surgeon: Vickey Huger, MD;  Location: [facility];  Service: Orthopedics;  Laterality: Left;    Current Medications: Current Meds  Medication Sig  . aspirin EC 81 MG EC tablet Take 1 tablet (81 mg total) by mouth daily.  . Calcium Carb-Cholecalciferol (CALCIUM-VITAMIN D) 500-200 MG-UNIT tablet Take 1 tablet by mouth daily.  Marland Kitchen glipiZIDE (GLUCOTROL XL) 5 MG 24 hr tablet Take 1 tablet by mouth as needed. Take only when sugar is above 120.  . hydrochlorothiazide (HYDRODIURIL) 25 MG tablet Take 25 mg by mouth daily. If blood pressure is over 140/80  . MAGNESIUM GLYCINATE PLUS PO Take 1 tablet by mouth daily.  . Melatonin 10 MG TABS Take 1 tablet by mouth daily.  . metoprolol tartrate (LOPRESSOR) 25 MG tablet Take 25 mg by mouth daily.  . multivitamin-iron-minerals-folic acid (THERAPEUTIC-M) TABS tablet Take 1 tablet by mouth daily.  . QUEtiapine (SEROQUEL) 25 MG tablet Take 50 mg by mouth at bedtime.   . rosuvastatin (CRESTOR) 5 MG tablet Take 1 tablet (5 mg total) by mouth daily.     Allergies:   Metformin and Methocarbamol   Social History   Socioeconomic History  . Marital status: Married    Spouse name: Not on file  . Number of children: Not on file  . Years of education: Not on file  . Highest education level:  Not on file  Occupational History  . Not on file  Social Needs  . Financial resource strain: Not on file  . Food insecurity:    Worry: Not on file    Inability: Not on file  . Transportation needs:    Medical: Not on file    Non-medical: Not on file  Tobacco Use  . Smoking status: Former Smoker    Packs/day: 1.50    Years: 24.00    Pack years: 36.00    Types: Cigarettes    Last attempt to quit: 1930    Years since quitting: 90.0  . Smokeless tobacco: Former Systems developer    Types: Chew  Substance and Sexual Activity  . Alcohol use: No    Comment: quit 30 yrs ago  . Drug use: No  .  Sexual activity: Not on file  Lifestyle  . Physical activity:    Days per week: Not on file    Minutes per session: Not on file  . Stress: Not on file  Relationships  . Social connections:    Talks on phone: Not on file    Gets together: Not on file    Attends religious service: Not on file    Active member of club or organization: Not on file    Attends meetings of clubs or organizations: Not on file    Relationship status: Not on file  Other Topics Concern  . Not on file  Social History Narrative  . Not on file     Family History: The patient's family history includes Heart attack in his father and mother. ROS:   Please see the history of present illness.    All other systems reviewed and are negative.  EKGs/Labs/Other Studies Reviewed:    The following studies were reviewed today:  EKG:  EKG ordered today.  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  Recent Lipid Panel No results found for: CHOL, TRIG, HDL, CHOLHDL, VLDL, LDLCALC, LDLDIRECT  Physical Exam:    VS:  Ht _0  (1.626 m)   Wt 164 lb (74.4 kg)   BMI 28.15 kg/m     Wt Readings from Last 3 Encounters:  01/27/18 164 lb (74.4 kg)  03/11/17 157 lb (71.2 kg)  12/30/16 160 lb 12.8 oz (72.9 kg)      GEN:  Well nourished, well developed in no acute distress HEENT: Normal NECK: No JVD; No carotid bruits LYMPHATICS: No lymphadenopathy CARDIAC: RRR, no murmurs, rubs, gallops RESPIRATORY:  Clear to auscultation without rales, wheezing or rhonchi  ABDOMEN: Soft, non-tender, non-distended MUSCULOSKELETAL:  No edema; No deformity  SKIN: Warm and dry NEUROLOGIC:  Alert and oriented x 3 PSYCHIATRIC:  Normal affect    Signed, Shirlee More, MD  01/27/2018 1:30 PM    [facility]

## 2018-01-27 NOTE — Addendum Note (Signed)
Addended by: Stevan Born on: 01/27/2018 02:13 PM   Modules accepted: Orders

## 2018-01-27 NOTE — Patient Instructions (Addendum)
Medication Instructions:  Your physician recommends that you continue on your current medications as directed. Please refer to the Current Medication list given to you today.  If you need a refill on your cardiac medications before your next appointment, please call your pharmacy.   Lab work: NONE If you have labs (blood work) drawn today and your tests are completely normal, you will receive your results only by: Marland Kitchen MyChart Message (if you have MyChart) OR . A paper copy in the mail If you have any lab test that is abnormal or we need to change your treatment, we will call you to review the results.  Testing/Procedures: You had an EKG today  Follow-Up: At [facility], you and your health needs are our priority.  As part of our continuing mission to provide you with exceptional heart care, we have created designated Provider Care Teams.  These Care Teams include your primary Cardiologist (physician) and Advanced Practice Providers (APPs -  Physician Assistants and Nurse Practitioners) who all work together to provide you with the care you need, when you need it. You will need a follow up appointment in 1 years.  Please call our office 2 months in advance to schedule this appointment.  KardiaMobile [email or web address]   FDA-cleared, clinical grade mobile EKG monitor: Jodelle Red is the most clinically-validated mobile EKG used by the world's leading cardiac care medical professionals With Basic service, know instantly if your heart rhythm is normal or if atrial fibrillation is detected, and email the last single EKG recording to yourself or your doctor Premium service, available for purchase through the Kardia app for $9.99 per month or $99 per year, includes unlimited history and storage of your EKG recordings, a monthly EKG summary report to share with your doctor, along with the ability to track your blood pressure, activity and weight Includes one KardiaMobile phone  clip FREE SHIPPING: Standard delivery 1-3 business days. Orders placed by 11:00am PST will ship that afternoon. Otherwise, will ship next business day. All orders ship via [employer] from [place], [place]

## 2018-02-24 DIAGNOSIS — E119 Type 2 diabetes mellitus without complications: Secondary | ICD-10-CM | POA: Diagnosis not present

## 2018-03-08 DIAGNOSIS — I10 Essential (primary) hypertension: Secondary | ICD-10-CM | POA: Diagnosis not present

## 2018-03-08 DIAGNOSIS — E785 Hyperlipidemia, unspecified: Secondary | ICD-10-CM | POA: Diagnosis not present

## 2018-03-08 DIAGNOSIS — F5104 Psychophysiologic insomnia: Secondary | ICD-10-CM | POA: Diagnosis not present

## 2018-03-08 DIAGNOSIS — Z125 Encounter for screening for malignant neoplasm of prostate: Secondary | ICD-10-CM | POA: Diagnosis not present

## 2018-03-08 DIAGNOSIS — E118 Type 2 diabetes mellitus with unspecified complications: Secondary | ICD-10-CM | POA: Diagnosis not present

## 2018-03-30 ENCOUNTER — Telehealth: Payer: Self-pay | Admitting: Cardiology

## 2018-03-30 MED ORDER — ROSUVASTATIN CALCIUM 5 MG PO TABS: 5.0000 mg | ORAL_TABLET | Freq: Every day | ORAL | 2 refills | Status: DC

## 2018-03-30 NOTE — Telephone Encounter (Signed)
Refill for crestor sent to [facility] as requested.

## 2018-03-30 NOTE — Telephone Encounter (Signed)
call Crestor to [facility]

## 2018-04-12 DIAGNOSIS — N400 Enlarged prostate without lower urinary tract symptoms: Secondary | ICD-10-CM | POA: Diagnosis not present

## 2018-04-12 DIAGNOSIS — N3000 Acute cystitis without hematuria: Secondary | ICD-10-CM | POA: Diagnosis not present

## 2018-04-29 ENCOUNTER — Other Ambulatory Visit: Payer: Self-pay | Admitting: Cardiology

## 2018-07-12 DIAGNOSIS — F5104 Psychophysiologic insomnia: Secondary | ICD-10-CM | POA: Diagnosis not present

## 2018-07-12 DIAGNOSIS — E118 Type 2 diabetes mellitus with unspecified complications: Secondary | ICD-10-CM | POA: Diagnosis not present

## 2018-07-12 DIAGNOSIS — I10 Essential (primary) hypertension: Secondary | ICD-10-CM | POA: Diagnosis not present

## 2018-07-12 DIAGNOSIS — E785 Hyperlipidemia, unspecified: Secondary | ICD-10-CM | POA: Diagnosis not present

## 2018-10-21 DIAGNOSIS — E785 Hyperlipidemia, unspecified: Secondary | ICD-10-CM | POA: Diagnosis not present

## 2018-10-21 DIAGNOSIS — Z9181 History of falling: Secondary | ICD-10-CM | POA: Diagnosis not present

## 2018-10-21 DIAGNOSIS — Z139 Encounter for screening, unspecified: Secondary | ICD-10-CM | POA: Diagnosis not present

## 2018-10-21 DIAGNOSIS — Z0000 Encounter for general adult medical examination without abnormal findings: Secondary | ICD-10-CM | POA: Diagnosis not present

## 2018-10-21 DIAGNOSIS — Z136 Encounter for screening for cardiovascular disorders: Secondary | ICD-10-CM | POA: Diagnosis not present

## 2018-10-21 DIAGNOSIS — Z125 Encounter for screening for malignant neoplasm of prostate: Secondary | ICD-10-CM | POA: Diagnosis not present

## 2018-10-21 DIAGNOSIS — Z1331 Encounter for screening for depression: Secondary | ICD-10-CM | POA: Diagnosis not present

## 2018-11-11 DIAGNOSIS — F5104 Psychophysiologic insomnia: Secondary | ICD-10-CM | POA: Diagnosis not present

## 2018-11-11 DIAGNOSIS — E118 Type 2 diabetes mellitus with unspecified complications: Secondary | ICD-10-CM | POA: Diagnosis not present

## 2018-11-11 DIAGNOSIS — I10 Essential (primary) hypertension: Secondary | ICD-10-CM | POA: Diagnosis not present

## 2018-11-11 DIAGNOSIS — E785 Hyperlipidemia, unspecified: Secondary | ICD-10-CM | POA: Diagnosis not present

## 2018-12-27 ENCOUNTER — Telehealth: Payer: Self-pay | Admitting: Cardiology

## 2018-12-27 MED ORDER — ROSUVASTATIN CALCIUM 5 MG PO TABS: 5.0000 mg | ORAL_TABLET | Freq: Every day | ORAL | 0 refills | Status: DC

## 2018-12-27 NOTE — Telephone Encounter (Signed)
*  STAT* If patient is at the pharmacy, call can be transferred to refill team.   1. Which medications need to be refilled? (please list name of each medication and dose if known) Geveric Crestor takes 24m 1 daily   2. Which pharmacy/location (including street and city if local pharmacy) is medication to be sent to? [facility] mail order  3. Do they need a 30 day or 90 day supply? [id number]

## 2018-12-27 NOTE — Telephone Encounter (Signed)
Spoke with patient's wife, Vira Agar, per [facility] and scheduled patient for his 1 year follow up appointment on 02/15/2019 at 10:00 am in the [place] office with Dr. Bettina Gavia.  Refill for rosuvastatin sent to [facility] as requested.

## 2018-12-27 NOTE — Addendum Note (Signed)
Addended by: Austin Miles on: 12/27/2018 02:37 PM   Modules accepted: Orders

## 2019-01-03 DIAGNOSIS — L821 Other seborrheic keratosis: Secondary | ICD-10-CM | POA: Diagnosis not present

## 2019-01-03 DIAGNOSIS — L570 Actinic keratosis: Secondary | ICD-10-CM | POA: Diagnosis not present

## 2019-01-03 DIAGNOSIS — C44311 Basal cell carcinoma of skin of nose: Secondary | ICD-10-CM | POA: Diagnosis not present

## 2019-01-03 DIAGNOSIS — D485 Neoplasm of uncertain behavior of skin: Secondary | ICD-10-CM | POA: Diagnosis not present

## 2019-02-09 ENCOUNTER — Other Ambulatory Visit: Payer: Self-pay | Admitting: *Deleted

## 2019-02-14 NOTE — Progress Notes (Signed)
Cardiology Office Note:    Date:  02/15/2019   ID:  Christian Patton, DOB 05/22/1940, MRN [id number]  PCP:  Lowella Dandy, NP  Cardiologist:  Shirlee More, MD    Referring MD: Lowella Dandy, NP    ASSESSMENT:    1. Coronary artery disease of native artery of native heart with stable angina pectoris ([place])   2. Hypertensive heart disease with heart failure ([place])   3. Obstructive hypertrophic cardiomyopathy ([place])   4. Type 2 diabetes mellitus without complication, without long-term current use of insulin ([place])   5. Mixed hyperlipidemia    PLAN:    In order of problems listed above:  1. Stable CAD having no angina on current medical treatment New York Heart Association class I continue aspirin and beta-blocker with his obstructive hypertrophic cardiomyopathy and a high potency statin his lipids are at target.  After discussion with the patient and shared decision making we do not feel he requires an ischemia evaluation at this time.  He has had no GI symptoms from aspirin 2. Hypertension is stable Home blood pressures invariably in range he has whitecoat syndrome and continue his current antihypertensives.  In particular I do not want to provoke hypotension and obstructive symptoms. 3. Stable asymptomatic he has a significantly abnormal EKG he is at risk for systolic dysfunction check echocardiogram and may require adjustment of medications 4. Stable diabetes glucose at target continue current treatment with Glucotrol if additional agent is needed SGLT2 inhibitor or GLP-1 agent would be appropriate from a cardiovascular perspective 5. His lipids are ideal continue his high intensity statin with CAD he has had no muscle symptoms of weakness or pain   Next appointment: 6 months   Medication Adjustments/Labs and Tests Ordered: Current medicines are reviewed at length with the patient today.  Concerns regarding medicines are outlined above.  No orders of the defined types were placed in this  encounter.  No orders of the defined types were placed in this encounter.   Chief Complaint  Patient presents with  . Follow-up  . Coronary Artery Disease    History of Present Illness:    Christian Patton is a 79 y.o. male with a hx of coronary artery disease with previous PCI in 2012 , obstructive hypertrophic cardiomyopathy with dynamic LV outflow tract obstruction, type 2 diabetes, hypertension with heart failure, hyperlipidemia and a history of pulmonary embolism following joint replacement surgery   He was last seen 01/27/2018.  His last echocardiogram 01/22/2017 at [facility] reviewed normal ejection fraction no outflow tract obstruction mitral annular calcification which is felt to be severe with mild valvular regurgitation. Compliance with diet, lifestyle and medications: Yes  Remains very active and is not having any exertional chest pain shortness of breath palpitations syncope or TIA.  Home blood pressure runs consistently less than 119 systolic and repeat by me in the office 162/80.  He has a history of hypertrophic cardiomyopathy previously with heart failure and obstructive symptoms and needs a repeat echocardiogram he agrees if he had systolic dysfunction we need to adjust his medications.  His diabetes is very well controlled A1c 6.6% and most recent lipids 11/11/2018 are at target with a cholesterol 153 HDL 44 LDL 81.  Although as he has a history of heart failure he does not require a diuretic at this time. Past Medical History:  Diagnosis Date  . Acute renal failure ([place]) 07/12/2014  . Arthralgia of both knees 06/15/2014  . Arthritis   .  Atherosclerotic heart disease of native coronary artery without angina pectoris Sep 26, 2014   Overview:  Stented x 3; 2012;  cardiac cath clean arteries, 2014 Overview:  Overview:  Stented x 3; 2012;  cardiac cath clean arteries, 2014  . Chronic anticoagulation 09/26/14  . 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 34.9cm/sec               No mass, no ascites Labs- thrombocytopenia Overview:  Overview:  genotype 1a; Stage II fibrosis, Bx. 2005; Tx'd with Harvoni x 24 weeks, 2015  . Coronary artery disease   . Diabetes mellitus without complication ([place])    TYPE 2  . Essential hypertension 09-26-2014   Overview:  on meds Overview:  Overview:  on meds  . Exposure to hepatitis C 12/26/2016   Overview:  Diagnosed 1995 Genotype 1a Liver Bx- Stage II fibrosis, 2005 Treated with IFN + RBV x 24 weeks, no response, 2002  continued additional 2 years-2005 Re-treated with Harvoni, 24 weeks, 2015   Sustained viral eradication  Risks-blood transfusion, UGI bleed, perforated duodenal ulcer, 1966  . 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 Hepatitis    C       "Has taken Harvoni"  . History of other specified conditions presenting hazards to health 09/26/14   Overview:  Overview:  perforated gastric ulcer  . Hypertension   . 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 Obstructive hypertrophic cardiomyopathy ([place]) 09-26-2014  . Primary osteoarthritis of left knee 06/23/2014  . Pulmonary embolism without acute cor pulmonale ([place]) 09/26/2014  . Sudden cardiac death ([place]) 2014/09/26  . Urine retention 07/12/2014    Past Surgical History:  Procedure Laterality Date  . CARDIAC CATHETERIZATION     x 3    Last one was in 2011 @ [facility]    . TOTAL KNEE ARTHROPLASTY Left 07/03/2014   Procedure: LEFT TOTAL KNEE ARTHOPLASTY;  Surgeon: Vickey Huger, MD;  Location: [facility];  Service: Orthopedics;  Laterality: Left;    Current Medications: Current Meds  Medication Sig  . aspirin EC 81 MG EC tablet Take 1 tablet (81 mg total) by mouth daily.  . Calcium Carb-Cholecalciferol (CALCIUM-VITAMIN D)  500-200 MG-UNIT tablet Take 1 tablet by mouth daily.  Marland Kitchen glipiZIDE (GLUCOTROL XL) 5 MG 24 hr tablet Take 1 tablet by mouth as needed. Take only when sugar is above 120.  . hydrochlorothiazide (HYDRODIURIL) 25 MG tablet Take 25 mg by mouth daily. If blood pressure is over 140/80  . MAGNESIUM GLYCINATE PLUS PO Take 1 tablet by mouth daily.  . Melatonin 10 MG TABS Take 1 tablet by mouth daily.  . metoprolol succinate (TOPROL-XL) 25 MG 24 hr tablet Take 25 mg by mouth daily.  . metoprolol tartrate (LOPRESSOR) 25 MG tablet Take 25 mg by mouth daily.  . multivitamin-iron-minerals-folic acid (THERAPEUTIC-M) TABS tablet Take 1 tablet by mouth daily.  . QUEtiapine (SEROQUEL) 25 MG tablet Take 50 mg by mouth at bedtime.   . rosuvastatin (CRESTOR) 5 MG tablet Take 1 tablet (5 mg total) by mouth daily.     Allergies:   Metformin and Methocarbamol   Social History   Socioeconomic History  . Marital status: Married    Spouse  name: Not on file  . Number of children: Not on file  . Years of education: Not on file  . Highest education level: Not on file  Occupational History  . Not on file  Tobacco Use  . Smoking status: Former Smoker    Packs/day: 1.50    Years: 24.00    Pack years: 36.00    Types: Cigarettes    Quit date: 1930    Years since quitting: 91.0  . Smokeless tobacco: Former Systems developer    Types: Chew  Substance and Sexual Activity  . Alcohol use: No    Comment: quit 30 yrs ago  . Drug use: No  . Sexual activity: Not on file  Other Topics Concern  . Not on file  Social History Narrative  . Not on file   Social Determinants of Health   Financial Resource Strain:   . Difficulty of Paying Living Expenses: Not on file  Food Insecurity:   . Worried About Charity fundraiser in the Last Year: Not on file  . Ran Out of Food in the Last Year: Not on file  Transportation Needs:   . Lack of Transportation (Medical): Not on file  . Lack of Transportation (Non-Medical): Not on file    Physical Activity:   . Days of Exercise per Week: Not on file  . Minutes of Exercise per Session: Not on file  Stress:   . Feeling of Stress : Not on file  Social Connections:   . Frequency of Communication with Friends and Family: Not on file  . Frequency of Social Gatherings with Friends and Family: Not on file  . Attends Religious Services: Not on file  . Active Member of Clubs or Organizations: Not on file  . Attends Archivist Meetings: Not on file  . Marital Status: Not on file     Family History: The patient's family history includes Heart attack in his father and mother. ROS:   Please see the history of present illness.    All other systems reviewed and are negative.  EKGs/Labs/Other Studies Reviewed:    The following studies were reviewed today:  EKG:  EKG ordered today and personally reviewed.  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 probable LVH repolarization   Physical Exam:    VS:  BP (!) 188/90 (BP Location: Left Arm, Patient Position: Sitting, Cuff Size: Normal)   Pulse 68   Ht _0  (1.626 m)   Wt 168 lb (76.2 kg)   SpO2 98%   BMI 28.84 kg/m     Wt Readings from Last 3 Encounters:  02/15/19 168 lb (76.2 kg)  01/27/18 164 lb (74.4 kg)  03/11/17 157 lb (71.2 kg)    Repeat blood pressure by me right arm sitting 162/80 GEN:  Well nourished, well developed in no acute distress HEENT: Normal NECK: No JVD; No carotid bruits LYMPHATICS: No lymphadenopathy CARDIAC: RRR, no murmurs, rubs, gallops RESPIRATORY:  Clear to auscultation without rales, wheezing or rhonchi  ABDOMEN: Soft, non-tender, non-distended MUSCULOSKELETAL:  No edema; No deformity  SKIN: Warm and dry NEUROLOGIC:  Alert and oriented x 3 PSYCHIATRIC:  Normal affect    Signed, Shirlee More, MD  02/15/2019 10:16 AM    [place]

## 2019-02-15 ENCOUNTER — Other Ambulatory Visit: Payer: Self-pay

## 2019-02-15 ENCOUNTER — Ambulatory Visit (INDEPENDENT_AMBULATORY_CARE_PROVIDER_SITE_OTHER): Payer: Medicare Other | Admitting: Cardiology

## 2019-02-15 ENCOUNTER — Encounter: Payer: Self-pay | Admitting: Cardiology

## 2019-02-15 VITALS — BP 188/90 | HR 68 | Ht 64.0 in | Wt 168.0 lb

## 2019-02-15 DIAGNOSIS — I110 Hypertensive heart disease with heart failure: Secondary | ICD-10-CM | POA: Diagnosis not present

## 2019-02-15 DIAGNOSIS — E782 Mixed hyperlipidemia: Secondary | ICD-10-CM

## 2019-02-15 DIAGNOSIS — I421 Obstructive hypertrophic cardiomyopathy: Secondary | ICD-10-CM | POA: Diagnosis not present

## 2019-02-15 DIAGNOSIS — I25118 Atherosclerotic heart disease of native coronary artery with other forms of angina pectoris: Secondary | ICD-10-CM | POA: Diagnosis not present

## 2019-02-15 DIAGNOSIS — E119 Type 2 diabetes mellitus without complications: Secondary | ICD-10-CM

## 2019-02-15 NOTE — Patient Instructions (Signed)
Medication Instructions:  Your physician recommends that you continue on your current medications as directed. Please refer to the Current Medication list given to you today.  *If you need a refill on your cardiac medications before your next appointment, please call your pharmacy*  Lab Work: None ordered   If you have labs (blood work) drawn today and your tests are completely normal, you will receive your results only by: Marland Kitchen MyChart Message (if you have MyChart) OR . A paper copy in the mail If you have any lab test that is abnormal or we need to change your treatment, we will call you to review the results.  Testing/Procedures: Your physician has requested that you have an echocardiogram. Echocardiography is a painless test that uses sound waves to create images of your heart. It provides your doctor with information about the size and shape of your heart and how well your heart's chambers and valves are working. This procedure takes approximately one hour. There are no restrictions for this procedure.    Follow-Up: At [facility], you and your health needs are our priority.  As part of our continuing mission to provide you with exceptional heart care, we have created designated Provider Care Teams.  These Care Teams include your primary Cardiologist (physician) and Advanced Practice Providers (APPs -  Physician Assistants and Nurse Practitioners) who all work together to provide you with the care you need, when you need it.  Your next appointment:   6 month(s)  The format for your next appointment:   In Person  Provider:   Shirlee More, MD  Other Instructions None

## 2019-03-02 ENCOUNTER — Telehealth: Payer: Self-pay | Admitting: Cardiology

## 2019-03-02 MED ORDER — HYDROCHLOROTHIAZIDE 25 MG PO TABS: 25.0000 mg | ORAL_TABLET | Freq: Every day | ORAL | 0 refills | Status: DC

## 2019-03-02 NOTE — Telephone Encounter (Signed)
Refill sent.

## 2019-03-02 NOTE — Telephone Encounter (Signed)
*  STAT* If patient is at the pharmacy, call can be transferred to refill team.   1. Which medications need to be refilled? (please list name of each medication and dose if known) hydrochlorothiazide (HYDRODIURIL) 25 MG tablet  2. Which pharmacy/location (including street and city if local pharmacy) is medication to be sent to? [facility] [phone] - [place], [address] AT [address]  3. Do they need a 30 day or 90 day supply? 90 day

## 2019-03-02 NOTE — Telephone Encounter (Signed)
Yes 90-day prescription

## 2019-03-17 DIAGNOSIS — F5104 Psychophysiologic insomnia: Secondary | ICD-10-CM | POA: Diagnosis not present

## 2019-03-17 DIAGNOSIS — E785 Hyperlipidemia, unspecified: Secondary | ICD-10-CM | POA: Diagnosis not present

## 2019-03-17 DIAGNOSIS — E118 Type 2 diabetes mellitus with unspecified complications: Secondary | ICD-10-CM | POA: Diagnosis not present

## 2019-03-17 DIAGNOSIS — I10 Essential (primary) hypertension: Secondary | ICD-10-CM | POA: Diagnosis not present

## 2019-03-28 ENCOUNTER — Telehealth: Payer: Self-pay | Admitting: Cardiology

## 2019-03-28 MED ORDER — ROSUVASTATIN CALCIUM 5 MG PO TABS: 5.0000 mg | ORAL_TABLET | Freq: Every day | ORAL | 3 refills | Status: DC

## 2019-03-28 NOTE — Telephone Encounter (Signed)
Refill sent to the pharmacy electronically.

## 2019-03-28 NOTE — Telephone Encounter (Signed)
New message   Patient needs a new prescription for this medication rosuvastatin (CRESTOR) 5 MG tabletsent to [facility] [place] [facility].

## 2019-04-06 ENCOUNTER — Ambulatory Visit (INDEPENDENT_AMBULATORY_CARE_PROVIDER_SITE_OTHER): Payer: Medicare Other

## 2019-04-06 ENCOUNTER — Other Ambulatory Visit: Payer: Self-pay

## 2019-04-06 DIAGNOSIS — I421 Obstructive hypertrophic cardiomyopathy: Secondary | ICD-10-CM

## 2019-04-06 NOTE — Progress Notes (Unsigned)
Complete echocardiogram has been performed.  Jimmy Dorlene Footman RDCS, RVT 

## 2019-04-12 DIAGNOSIS — N401 Enlarged prostate with lower urinary tract symptoms: Secondary | ICD-10-CM | POA: Diagnosis not present

## 2019-04-12 DIAGNOSIS — N3020 Other chronic cystitis without hematuria: Secondary | ICD-10-CM | POA: Diagnosis not present

## 2019-04-12 DIAGNOSIS — N318 Other neuromuscular dysfunction of bladder: Secondary | ICD-10-CM | POA: Diagnosis not present

## 2019-05-27 ENCOUNTER — Other Ambulatory Visit: Payer: Self-pay | Admitting: Internal Medicine

## 2019-07-04 DIAGNOSIS — L570 Actinic keratosis: Secondary | ICD-10-CM | POA: Diagnosis not present

## 2019-07-04 DIAGNOSIS — Z08 Encounter for follow-up examination after completed treatment for malignant neoplasm: Secondary | ICD-10-CM | POA: Diagnosis not present

## 2019-07-04 DIAGNOSIS — Z85828 Personal history of other malignant neoplasm of skin: Secondary | ICD-10-CM | POA: Diagnosis not present

## 2019-07-04 DIAGNOSIS — L578 Other skin changes due to chronic exposure to nonionizing radiation: Secondary | ICD-10-CM | POA: Diagnosis not present

## 2019-07-04 DIAGNOSIS — L821 Other seborrheic keratosis: Secondary | ICD-10-CM | POA: Diagnosis not present

## 2019-07-04 DIAGNOSIS — L814 Other melanin hyperpigmentation: Secondary | ICD-10-CM | POA: Diagnosis not present

## 2019-07-15 DIAGNOSIS — F5104 Psychophysiologic insomnia: Secondary | ICD-10-CM | POA: Diagnosis not present

## 2019-07-15 DIAGNOSIS — I10 Essential (primary) hypertension: Secondary | ICD-10-CM | POA: Diagnosis not present

## 2019-07-15 DIAGNOSIS — E118 Type 2 diabetes mellitus with unspecified complications: Secondary | ICD-10-CM | POA: Diagnosis not present

## 2019-07-15 DIAGNOSIS — E785 Hyperlipidemia, unspecified: Secondary | ICD-10-CM | POA: Diagnosis not present

## 2019-08-26 ENCOUNTER — Other Ambulatory Visit: Payer: Self-pay | Admitting: Internal Medicine

## 2019-09-05 NOTE — Progress Notes (Signed)
Cardiology Office Note:    Date:  09/06/2019   ID:  [name], DOB 1940-10-14, MRN [id number]  PCP:  Christian Dandy, NP  Cardiologist:  Shirlee More, MD    Referring MD: Christian Dandy, NP    ASSESSMENT:    1. Coronary artery disease of native artery of native heart with stable angina pectoris ([place])   2. Hypertensive heart disease with heart failure ([place])   3. Obstructive hypertrophic cardiomyopathy ([place])   4. Mixed hyperlipidemia   5. Type 2 diabetes mellitus without complication, without long-term current use of insulin (HCC)    PLAN:    In order of problems listed above:  1. In general Christian Patton is done very well and is not having cardiovascular symptoms of angina shortness of breath palpitation or syncope.  His CAD is stable continue treatment including aspirin beta-blocker and his high intensity statin.  Christian Patton is having no angina would not advise an ischemia evaluation at this time and if Christian Patton was having frequent symptoms I would directly refer to coronary angiography. 2. BP is stable Christian Patton is consistently in range at home and I am hesitant to expose him to ACE ARB or vasodilators with his dynamic LV outflow tract obstruction and will continue treatment including beta-blocker with obstructive hypertrophic cardiomyopathy 3. Stable asymptomatic.  Christian Patton has no clinical markers of high risk. 4. Lipids are ideal continue his high intensity statin 5. Well-controlled A1c at target at this time is only on glipizide.   Next appointment: 6 months   Medication Adjustments/Labs and Tests Ordered: Current medicines are reviewed at length with the patient today.  Concerns regarding medicines are outlined above.  No orders of the defined types were placed in this encounter.  No orders of the defined types were placed in this encounter.   Chief Complaint  Patient presents with  . Follow-up  . Coronary Artery Disease    History of Present Illness:    Christian Patton is a 79 y.o. male with a hx of  coronary artery disease with previous PCI in 2012 , obstructive hypertrophic cardiomyopathy with dynamic LV outflow tract obstruction, type 2 diabetes, hypertension with heart failure, hyperlipidemia and a history of pulmonary embolism following joint replacement surgery    Christian Patton was last seen 02/15/2019. 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  Christian Patton has no exercise intolerance shortness of breath chest pain palpitation or syncope and is decided not to have further knee surgery and wears a knee stabilizing brace.  Christian Patton follows closely with his primary care physician recent labs are ideal 0 07/15/2019 with an A1c of 7% cholesterol 144 LDL 77 HDL 38 triglycerides 168 creatinine normal 1.04.  His home blood pressure consistently runs 120 and 130/70.  Christian Patton has not received COVID-19 immunization and does not intend to. Past Medical History:  Diagnosis Date  . Acute renal failure ([place]) 07/12/2014  . Arthralgia of both knees 06/15/2014  . Arthritis   . Atherosclerotic heart disease of native coronary artery without angina pectoris 09/13/2014   Overview:  Stented x 3; 2012;  cardiac cath clean arteries, 2014 Overview:  Overview:  Stented x 3; 2012;  cardiac cath clean arteries, 2014  . Chronic anticoagulation 09/13/2014  . 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 34.9cm/sec               No mass, no ascites Labs- thrombocytopenia Overview:  Overview:  genotype 1a; Stage II fibrosis, Bx. 2005; Tx'd with Harvoni x 24 weeks, 2015  . Coronary artery disease   . Diabetes mellitus without complication ([place])    TYPE 2  . Essential hypertension 2014-10-01    Overview:  on meds Overview:  Overview:  on meds  . Exposure to hepatitis C 12/26/2016   Overview:  Diagnosed 1995 Genotype 1a Liver Bx- Stage II fibrosis, 2005 Treated with IFN + RBV x 24 weeks, no response, 2002  continued additional 2 years-2005 Re-treated with Harvoni, 24 weeks, 2015   Sustained viral eradication  Risks-blood transfusion, UGI bleed, perforated duodenal ulcer, 1966  . 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 Hepatitis    C       "Has taken Harvoni"  . History of other specified conditions presenting hazards to health 10-01-14   Overview:  Overview:  perforated gastric ulcer  . Hypertension   . 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 Obstructive hypertrophic cardiomyopathy ([place]) 10-01-2014  . Primary osteoarthritis of left knee 06/23/2014  . Pulmonary embolism without acute cor pulmonale ([place]) 10/01/14  . Sudden cardiac death ([place]) 10/01/2014  . Urine retention 07/12/2014    Past Surgical History:  Procedure Laterality Date  . CARDIAC CATHETERIZATION     x 3    Last one was in 2011 @ [facility]    . TOTAL KNEE ARTHROPLASTY Left 07/03/2014   Procedure: LEFT TOTAL KNEE ARTHOPLASTY;  Surgeon: Vickey Huger, MD;  Location: [facility];  Service: Orthopedics;  Laterality: Left;    Current Medications: Current Meds  Medication Sig  . aspirin EC 81 MG EC tablet Take 1 tablet (81 mg total) by mouth daily.  . Calcium Carb-Cholecalciferol (CALCIUM-VITAMIN D) 500-200 MG-UNIT tablet Take 1 tablet by mouth daily.  Marland Kitchen glipiZIDE (GLUCOTROL XL) 5 MG 24 hr tablet Take 1 tablet by mouth as needed. Take only when sugar is above 120.  . hydrochlorothiazide (HYDRODIURIL) 25 MG tablet TAKE 1 TABLET BY MOUTH DAILY IF BLOOD PRESSURE OVER 140/80  . MAGNESIUM GLYCINATE PLUS PO Take 1 tablet by mouth daily.  . Melatonin 10 MG TABS Take 1 tablet by mouth daily.  . metoprolol succinate (TOPROL-XL) 25 MG 24 hr tablet Take 25 mg by mouth daily.  .  multivitamin-iron-minerals-folic acid (THERAPEUTIC-M) TABS tablet Take 1 tablet by mouth daily.  . QUEtiapine (SEROQUEL) 25 MG tablet Take 50 mg by mouth at bedtime.   . rosuvastatin (CRESTOR) 5 MG tablet Take 1 tablet (5 mg total) by mouth daily.  . [DISCONTINUED] metoprolol tartrate (LOPRESSOR) 25 MG tablet Take 25 mg by mouth daily.     Allergies:   Metformin and Methocarbamol   Social History   Socioeconomic History  . Marital status: Married    Spouse name: Not on file  . Number of children: Not on file  . Years of education: Not on file  . Highest education level: Not on file  Occupational History  . Not on file  Tobacco Use  . Smoking status: Former Smoker    Packs/day: 1.50    Years: 24.00    Pack years: 36.00  Types: Cigarettes    Quit date: 1930    Years since quitting: 91.6  . Smokeless tobacco: Former Systems developer    Types: Secondary school teacher  . Vaping Use: Never used  Substance and Sexual Activity  . Alcohol use: No    Comment: quit 30 yrs ago  . Drug use: No  . Sexual activity: Not on file  Other Topics Concern  . Not on file  Social History Narrative  . Not on file   Social Determinants of Health   Financial Resource Strain:   . Difficulty of Paying Living Expenses:   Food Insecurity:   . Worried About Charity fundraiser in the Last Year:   . Arboriculturist in the Last Year:   Transportation Needs:   . Film/video editor (Medical):   Marland Kitchen Lack of Transportation (Non-Medical):   Physical Activity:   . Days of Exercise per Week:   . Minutes of Exercise per Session:   Stress:   . Feeling of Stress :   Social Connections:   . Frequency of Communication with Friends and Family:   . Frequency of Social Gatherings with Friends and Family:   . Attends Religious Services:   . Active Member of Clubs or Organizations:   . Attends Archivist Meetings:   Marland Kitchen Marital Status:      Family History: The patient's family history includes Heart attack in  his father and mother. ROS:   Please see the history of present illness.    All other systems reviewed and are negative.  EKGs/Labs/Other Studies Reviewed:    The following studies were reviewed today:  EKG:  EKG last visit January 2021 showed sinus rhythm first-degree AV block nonspecific conduction delay.  I did not repeat an EKG today without discrete cardiovascular symptoms.    Physical Exam:    VS:  BP (!) 148/80   Pulse 59   Ht 5' 4" (1.626 m)   Wt 160 lb 3.2 oz (72.7 kg)   SpO2 97%   BMI 27.50 kg/m     Wt Readings from Last 3 Encounters:  09/06/19 160 lb 3.2 oz (72.7 kg)  02/15/19 168 lb (76.2 kg)  01/27/18 164 lb (74.4 kg)     GEN: Christian Patton is beginning to look a little frail well nourished, well developed in no acute distress HEENT: Normal NECK: No JVD; No carotid bruits LYMPHATICS: No lymphadenopathy CARDIAC: No murmur RRR, no murmurs, rubs, gallops RESPIRATORY:  Clear to auscultation without rales, wheezing or rhonchi  ABDOMEN: Soft, non-tender, non-distended MUSCULOSKELETAL:  No edema; No deformity  SKIN: Warm and dry NEUROLOGIC:  Alert and oriented x 3 PSYCHIATRIC:  Normal affect    Signed, Shirlee More, MD  09/06/2019 11:16 AM    [place]

## 2019-09-06 ENCOUNTER — Encounter: Payer: Self-pay | Admitting: Cardiology

## 2019-09-06 ENCOUNTER — Other Ambulatory Visit: Payer: Self-pay

## 2019-09-06 ENCOUNTER — Ambulatory Visit (INDEPENDENT_AMBULATORY_CARE_PROVIDER_SITE_OTHER): Payer: Medicare Other | Admitting: Cardiology

## 2019-09-06 VITALS — BP 148/80 | HR 59 | Ht 64.0 in | Wt 160.2 lb

## 2019-09-06 DIAGNOSIS — I110 Hypertensive heart disease with heart failure: Secondary | ICD-10-CM

## 2019-09-06 DIAGNOSIS — E782 Mixed hyperlipidemia: Secondary | ICD-10-CM | POA: Diagnosis not present

## 2019-09-06 DIAGNOSIS — E119 Type 2 diabetes mellitus without complications: Secondary | ICD-10-CM

## 2019-09-06 DIAGNOSIS — I421 Obstructive hypertrophic cardiomyopathy: Secondary | ICD-10-CM | POA: Diagnosis not present

## 2019-09-06 DIAGNOSIS — I25118 Atherosclerotic heart disease of native coronary artery with other forms of angina pectoris: Secondary | ICD-10-CM

## 2019-09-06 NOTE — Patient Instructions (Signed)
Medication Instructions:  Your physician recommends that you continue on your current medications as directed. Please refer to the Current Medication list given to you today.  *If you need a refill on your cardiac medications before your next appointment, please call your pharmacy*   Lab Work: None If you have labs (blood work) drawn today and your tests are completely normal, you will receive your results only by: Marland Kitchen MyChart Message (if you have MyChart) OR . A paper copy in the mail If you have any lab test that is abnormal or we need to change your treatment, we will call you to review the results.   Testing/Procedures: None   Follow-Up: At [facility], you and your health needs are our priority.  As part of our continuing mission to provide you with exceptional heart care, we have created designated Provider Care Teams.  These Care Teams include your primary Cardiologist (physician) and Advanced Practice Providers (APPs -  Physician Assistants and Nurse Practitioners) who all work together to provide you with the care you need, when you need it.  We recommend signing up for the patient portal called "MyChart".  Sign up information is provided on this After Visit Summary.  MyChart is used to connect with patients for Virtual Visits (Telemedicine).  Patients are able to view lab/test results, encounter notes, upcoming appointments, etc.  Non-urgent messages can be sent to your provider as well.   To learn more about what you can do with MyChart, go to [email or web address].    Your next appointment:   6 month(s)  The format for your next appointment:   In Person  Provider:   Shirlee More, MD   Other Instructions

## 2019-09-28 DIAGNOSIS — H43811 Vitreous degeneration, right eye: Secondary | ICD-10-CM | POA: Diagnosis not present

## 2019-09-28 DIAGNOSIS — H2513 Age-related nuclear cataract, bilateral: Secondary | ICD-10-CM | POA: Diagnosis not present

## 2019-10-25 DIAGNOSIS — N138 Other obstructive and reflux uropathy: Secondary | ICD-10-CM | POA: Diagnosis not present

## 2019-10-25 DIAGNOSIS — R339 Retention of urine, unspecified: Secondary | ICD-10-CM | POA: Diagnosis not present

## 2019-10-25 DIAGNOSIS — N401 Enlarged prostate with lower urinary tract symptoms: Secondary | ICD-10-CM | POA: Diagnosis not present

## 2019-11-17 DIAGNOSIS — E118 Type 2 diabetes mellitus with unspecified complications: Secondary | ICD-10-CM | POA: Diagnosis not present

## 2019-11-17 DIAGNOSIS — Z6826 Body mass index (BMI) 26.0-26.9, adult: Secondary | ICD-10-CM | POA: Diagnosis not present

## 2019-11-17 DIAGNOSIS — Z86711 Personal history of pulmonary embolism: Secondary | ICD-10-CM | POA: Diagnosis not present

## 2019-11-17 DIAGNOSIS — E785 Hyperlipidemia, unspecified: Secondary | ICD-10-CM | POA: Diagnosis not present

## 2019-11-17 DIAGNOSIS — Z1331 Encounter for screening for depression: Secondary | ICD-10-CM | POA: Diagnosis not present

## 2019-11-17 DIAGNOSIS — I2510 Atherosclerotic heart disease of native coronary artery without angina pectoris: Secondary | ICD-10-CM | POA: Diagnosis not present

## 2019-11-17 DIAGNOSIS — I10 Essential (primary) hypertension: Secondary | ICD-10-CM | POA: Diagnosis not present

## 2019-11-17 DIAGNOSIS — Z9181 History of falling: Secondary | ICD-10-CM | POA: Diagnosis not present

## 2019-11-17 DIAGNOSIS — F5104 Psychophysiologic insomnia: Secondary | ICD-10-CM | POA: Diagnosis not present

## 2019-12-13 DIAGNOSIS — H35372 Puckering of macula, left eye: Secondary | ICD-10-CM | POA: Diagnosis not present

## 2019-12-13 DIAGNOSIS — H2512 Age-related nuclear cataract, left eye: Secondary | ICD-10-CM | POA: Diagnosis not present

## 2019-12-13 DIAGNOSIS — E119 Type 2 diabetes mellitus without complications: Secondary | ICD-10-CM | POA: Diagnosis not present

## 2019-12-13 DIAGNOSIS — E113291 Type 2 diabetes mellitus with mild nonproliferative diabetic retinopathy without macular edema, right eye: Secondary | ICD-10-CM | POA: Diagnosis not present

## 2019-12-13 DIAGNOSIS — Z01818 Encounter for other preprocedural examination: Secondary | ICD-10-CM | POA: Diagnosis not present

## 2019-12-19 DIAGNOSIS — K759 Inflammatory liver disease, unspecified: Secondary | ICD-10-CM | POA: Insufficient documentation

## 2019-12-19 DIAGNOSIS — M1990 Unspecified osteoarthritis, unspecified site: Secondary | ICD-10-CM | POA: Insufficient documentation

## 2019-12-19 DIAGNOSIS — I10 Essential (primary) hypertension: Secondary | ICD-10-CM | POA: Insufficient documentation

## 2019-12-19 DIAGNOSIS — K219 Gastro-esophageal reflux disease without esophagitis: Secondary | ICD-10-CM | POA: Insufficient documentation

## 2019-12-20 NOTE — Progress Notes (Signed)
Cardiology Office Note:    Date:  12/21/2019   ID:  Christian Patton, DOB [birth year], MRN [id number]  PCP:  Christian Dandy, NP  Cardiologist:  Christian More, MD    Referring MD: Christian Dandy, NP    ASSESSMENT:    1. Atrial fibrillation, unspecified type (Christian Patton)   2. Coronary artery disease of native artery of native heart with stable angina pectoris ([name])   3. Hypertensive heart disease with heart failure ([name])   4. Obstructive hypertrophic cardiomyopathy (HCC)    PLAN:    In order of problems listed above:  1. He has new onset persistent atrial fibrillation rate appears controlled continue with beta-blocker with obstructive hypertrophic cardiomyopathy and CAD utilize a 3-day ZIO monitor to assess heart rate distribution initiate anticoagulation check labs today including a CBC and a CMP with his liver disease 2. Stable CAD New York Heart Association class I he is having no angina on current medical therapy continue the same 3. BP at target heart failure compensated presently not on a loop diuretic 4. Stable asymptomatic   Next appointment: 6 weeks   Medication Adjustments/Labs and Tests Ordered: Current medicines are reviewed at length with the patient today.  Concerns regarding medicines are outlined above.  Orders Placed This Encounter  Procedures  . LONG TERM MONITOR (3-14 DAYS)  . EKG 12-Lead   Meds ordered this encounter  Medications  . apixaban (ELIQUIS) 5 MG TABS tablet    Sig: Take 1 tablet (5 mg total) by mouth 2 (two) times daily.    Dispense:  60 tablet    Refill:  3    Chief Complaint  Patient presents with  . Follow-up  . Atrial Fibrillation    History of Present Illness:    Christian Patton is a 79 y.o. male with a hx of CAD with previous PCI in 2012 obstructive hypertrophic cardiomyopathy type 2 diabetes hypertension with heart failure hyperlipidemia and a history of pulmonary embolism following lower extremity joint replacement surgery.  He was last seen  09/06/2019.  He requested this appointment saying that he was found to have atrial fibrillation on [facility] screening. Compliance with diet, lifestyle and medications: Yes  Christian Patton noticed recently that he had exercise intolerance wondered how he was doing a little palpitations he went to [facility] screening was found to be in A. fib.  Today he continues in atrial fibrillation controlled rate.  His symptoms of exercise intolerance has resolved he has no shortness of breath chest pain palpitation.  He has chronic liver disease but he does not have varices and has never bled and has been treated for hepatitis C.  His risk of stroke is quite high with a CHA2DS2-VASc score of 6 and will run a do stop aspirin start him on anticoagulant Eliquis continue his current beta-blocker with CAD atrial fibrillation obstructive hypertrophic cardiomyopathy and utilize a 3-day ZIO monitor to assess heart rate control.  I will see him back in the office and we will continue a discussion about cardioversion but at this time he prefers a conservative approach with rate control and anticoagulation.  Labs with his primary care physician 11/17/2019: Cholesterol 154 LDL 81 triglycerides 133 HDL 41 A1c 7.0 creatinine 1.04 Past Medical History:  Diagnosis Date  . Acute renal failure ([place]) 07/12/2014  . Arthralgia of both knees 06/15/2014  . Arthritis   . Atherosclerotic heart disease of native coronary artery without angina pectoris 09/13/2014   Overview:  Stented x 3; 2012;  cardiac  cath clean arteries, 2014 Overview:  Overview:  Stented x 3; 2012;  cardiac cath clean arteries, 2014  . Chronic anticoagulation 2014/09/29  . 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 34.9cm/sec               No mass, no ascites Labs- thrombocytopenia  Overview:  Overview:  genotype 1a; Stage II fibrosis, Bx. 2005; Tx'd with Harvoni x 24 weeks, 2015  . Coronary artery disease   . Diabetes mellitus without complication ([place])    TYPE 2  . Essential hypertension 09/29/2014   Overview:  on meds Overview:  Overview:  on meds  . Exposure to hepatitis C 12/26/2016   Overview:  Diagnosed 1995 Genotype 1a Liver Bx- Stage II fibrosis, 2005 Treated with IFN + RBV x 24 weeks, no response, 2002  continued additional 2 years-2005 Re-treated with Harvoni, 24 weeks, 2015   Sustained viral eradication  Risks-blood transfusion, UGI bleed, perforated duodenal ulcer, 1966  . 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 Hepatitis    C       "Has taken Harvoni"  . History of other specified conditions presenting hazards to health Sep 29, 2014   Overview:  Overview:  perforated gastric ulcer  . Hypertension   . Hypertensive heart disease with heart failure ([place]) 2014-09-29  . 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 Obstructive hypertrophic cardiomyopathy ([place]) 09-29-2014  . Palpitation 01/27/2018  . Preoperative cardiovascular examination 12/29/2016  . Primary osteoarthritis of left knee 06/23/2014  . Pulmonary embolism without acute cor pulmonale ([place]) September 29, 2014  . Sudden cardiac death ([place]) 09/29/14  . Urine retention 07/12/2014    Past Surgical History:  Procedure Laterality Date  . CARDIAC CATHETERIZATION     x 3    Last one was in 2011 @ [facility]    . TOTAL KNEE ARTHROPLASTY Left 07/03/2014   Procedure: LEFT TOTAL KNEE ARTHOPLASTY;  Surgeon: Christian Huger, MD;  Location: [facility];  Service: Orthopedics;  Laterality: Left;    Current Medications: Current Meds  Medication Sig  . Calcium Carb-Cholecalciferol (CALCIUM-VITAMIN D) 500-200 MG-UNIT tablet Take 1 tablet by mouth daily.  Marland Kitchen glipiZIDE (GLUCOTROL XL) 5 MG 24 hr tablet Take 1 tablet by mouth as needed. Take only when sugar is above 120.  .  hydrochlorothiazide (HYDRODIURIL) 25 MG tablet TAKE 1 TABLET BY MOUTH DAILY IF BLOOD PRESSURE OVER 140/80  . MAGNESIUM GLYCINATE PLUS PO Take 1 tablet by mouth daily.  . Melatonin 10 MG TABS Take 1 tablet by mouth daily.  . metoprolol succinate (TOPROL-XL) 25 MG 24 hr tablet Take 25 mg by mouth daily.  . multivitamin-iron-minerals-folic acid (THERAPEUTIC-M) TABS tablet Take 1 tablet by mouth daily.  . Omega-3 Fatty Acids (FISH OIL) 600 MG CAPS Take 1 tablet by mouth daily.  . QUEtiapine (SEROQUEL) 25 MG tablet Take 50 mg by mouth at bedtime.   . rosuvastatin (CRESTOR) 5 MG tablet Take 1 tablet (5 mg total) by mouth daily.  . [DISCONTINUED] aspirin EC 81 MG EC tablet Take 1 tablet (81 mg total) by mouth daily.     Allergies:   Metformin and Methocarbamol   Social History   Socioeconomic History  . Marital status: Married  Spouse name: Not on file  . Number of children: Not on file  . Years of education: Not on file  . Highest education level: Not on file  Occupational History  . Not on file  Tobacco Use  . Smoking status: Former Smoker    Packs/day: 1.50    Years: 24.00    Pack years: 36.00    Types: Cigarettes    Quit date: 1930    Years since quitting: 91.9  . Smokeless tobacco: Former Systems developer    Types: Secondary school teacher  . Vaping Use: Never used  Substance and Sexual Activity  . Alcohol use: No    Comment: quit 30 yrs ago  . Drug use: No  . Sexual activity: Not on file  Other Topics Concern  . Not on file  Social History Narrative  . Not on file   Social Determinants of Health   Financial Resource Strain:   . Difficulty of Paying Living Expenses: Not on file  Food Insecurity:   . Worried About Charity fundraiser in the Last Year: Not on file  . Ran Out of Food in the Last Year: Not on file  Transportation Needs:   . Lack of Transportation (Medical): Not on file  . Lack of Transportation (Non-Medical): Not on file  Physical Activity:   . Days of Exercise per  Week: Not on file  . Minutes of Exercise per Session: Not on file  Stress:   . Feeling of Stress : Not on file  Social Connections:   . Frequency of Communication with Friends and Family: Not on file  . Frequency of Social Gatherings with Friends and Family: Not on file  . Attends Religious Services: Not on file  . Active Member of Clubs or Organizations: Not on file  . Attends Archivist Meetings: Not on file  . Marital Status: Not on file     Family History: The patient's family history includes Heart attack in his father and mother. ROS:   Please see the history of present illness.    All other systems reviewed and are negative.  EKGs/Labs/Other Studies Reviewed:    The following studies were reviewed today:  EKG:  EKG ordered today and personally reviewed.  The ekg ordered today demonstrates findings of LVH repolarization atrial fibrillation  Recent Labs: No results found for requested labs within last 8760 hours.  Recent Lipid Panel No results found for: CHOL, TRIG, HDL, CHOLHDL, VLDL, LDLCALC, LDLDIRECT  Physical Exam:    VS:  BP (!) 150/78   Pulse (!) 57   Ht 5' 4" (1.626 m)   Wt 159 lb (72.1 kg)   SpO2 93%   BMI 27.29 kg/m     Wt Readings from Last 3 Encounters:  12/21/19 159 lb (72.1 kg)  09/06/19 160 lb 3.2 oz (72.7 kg)  02/15/19 168 lb (76.2 kg)     GEN:  Well nourished, well developed in no acute distress HEENT: Normal NECK: No JVD; No carotid bruits LYMPHATICS: No lymphadenopathy CARDIAC: The rhythm is irregular S1 is variable no murmur no murmurs, rubs, gallops RESPIRATORY:  Clear to auscultation without rales, wheezing or rhonchi  ABDOMEN: Soft, non-tender, non-distended MUSCULOSKELETAL:  No edema; No deformity  SKIN: Warm and dry NEUROLOGIC:  Alert and oriented x 3 PSYCHIATRIC:  Normal affect    Signed, Christian More, MD  12/21/2019 2:44 PM    [place]

## 2019-12-21 ENCOUNTER — Other Ambulatory Visit: Payer: Self-pay

## 2019-12-21 ENCOUNTER — Ambulatory Visit (INDEPENDENT_AMBULATORY_CARE_PROVIDER_SITE_OTHER): Payer: Medicare Other | Admitting: Cardiology

## 2019-12-21 ENCOUNTER — Encounter: Payer: Self-pay | Admitting: Cardiology

## 2019-12-21 ENCOUNTER — Ambulatory Visit (INDEPENDENT_AMBULATORY_CARE_PROVIDER_SITE_OTHER): Payer: Medicare Other

## 2019-12-21 VITALS — BP 150/78 | HR 57 | Ht 64.0 in | Wt 159.0 lb

## 2019-12-21 DIAGNOSIS — I25118 Atherosclerotic heart disease of native coronary artery with other forms of angina pectoris: Secondary | ICD-10-CM | POA: Diagnosis not present

## 2019-12-21 DIAGNOSIS — I4891 Unspecified atrial fibrillation: Secondary | ICD-10-CM | POA: Diagnosis not present

## 2019-12-21 DIAGNOSIS — I110 Hypertensive heart disease with heart failure: Secondary | ICD-10-CM

## 2019-12-21 DIAGNOSIS — I421 Obstructive hypertrophic cardiomyopathy: Secondary | ICD-10-CM | POA: Diagnosis not present

## 2019-12-21 MED ORDER — APIXABAN 5 MG PO TABS: 5.0000 mg | ORAL_TABLET | Freq: Two times a day (BID) | ORAL | 3 refills | Status: DC

## 2019-12-21 NOTE — Patient Instructions (Signed)
Medication Instructions:  Your physician has recommended you make the following change in your medication:  STOP: Aspirin START: Eliquis 5 mg take one tablet by mouth twice daily.  *If you need a refill on your cardiac medications before your next appointment, please call your pharmacy*   Lab Work: None If you have labs (blood work) drawn today and your tests are completely normal, you will receive your results only by: Marland Kitchen MyChart Message (if you have MyChart) OR . A paper copy in the mail If you have any lab test that is abnormal or we need to change your treatment, we will call you to review the results.   Testing/Procedures: A zio monitor was ordered today. It will remain on for 3 days. You will then return monitor and event diary in provided box. It takes 1-2 weeks for report to be downloaded and returned to [place]. We will call you with the results. If monitor falls off or has orange flashing light, please call Zio for further instructions.      Follow-Up: At [facility], you and your health needs are our priority.  As part of our continuing mission to provide you with exceptional heart care, we have created designated Provider Care Teams.  These Care Teams include your primary Cardiologist (physician) and Advanced Practice Providers (APPs -  Physician Assistants and Nurse Practitioners) who all work together to provide you with the care you need, when you need it.  We recommend signing up for the patient portal called "MyChart".  Sign up information is provided on this After Visit Summary.  MyChart is used to connect with patients for Virtual Visits (Telemedicine).  Patients are able to view lab/test results, encounter notes, upcoming appointments, etc.  Non-urgent messages can be sent to your provider as well.   To learn more about what you can do with MyChart, go to [email or web address].    Your next appointment:   6 week(s)  The format for your next appointment:   In  Person  Provider:   Shirlee More, MD   Other Instructions

## 2019-12-22 LAB — COMPREHENSIVE METABOLIC PANEL
ALT: 18 IU/L (ref 0–44)
AST: 23 IU/L (ref 0–40)
Albumin/Globulin Ratio: 1.8 (ref 1.2–2.2)
Albumin: 4.7 g/dL (ref 3.7–4.7)
Alkaline Phosphatase: 97 IU/L (ref 44–121)
BUN/Creatinine Ratio: 25 — ABNORMAL HIGH (ref 10–24)
BUN: 30 mg/dL — ABNORMAL HIGH (ref 8–27)
Bilirubin Total: 0.4 mg/dL (ref 0.0–1.2)
CO2: 25 mmol/L (ref 20–29)
Calcium: 9.8 mg/dL (ref 8.6–10.2)
Chloride: 102 mmol/L (ref 96–106)
Creatinine, Ser: 1.2 mg/dL (ref 0.76–1.27)
GFR calc Af Amer: 66 mL/min/{1.73_m2} (ref >59)
GFR calc non Af Amer: 57 mL/min/{1.73_m2} — ABNORMAL LOW (ref >59)
Globulin, Total: 2.6 g/dL (ref 1.5–4.5)
Glucose: 150 mg/dL — ABNORMAL HIGH (ref 65–99)
Potassium: 4.3 mmol/L (ref 3.5–5.2)
Sodium: 140 mmol/L (ref 134–144)
Total Protein: 7.3 g/dL (ref 6.0–8.5)

## 2019-12-22 LAB — CBC
Hematocrit: 40.4 % (ref 37.5–51.0)
Hemoglobin: 13.9 g/dL (ref 13.0–17.7)
MCH: 29.9 pg (ref 26.6–33.0)
MCHC: 34.4 g/dL (ref 31.5–35.7)
MCV: 87 fL (ref 79–97)
Platelets: 234 10*3/uL (ref 150–450)
RBC: 4.65 x10E6/uL (ref 4.14–5.80)
RDW: 13.3 % (ref 11.6–15.4)
WBC: 9.6 10*3/uL (ref 3.4–10.8)

## 2019-12-26 ENCOUNTER — Telehealth: Payer: Self-pay | Admitting: Cardiology

## 2019-12-26 DIAGNOSIS — L570 Actinic keratosis: Secondary | ICD-10-CM | POA: Diagnosis not present

## 2019-12-26 DIAGNOSIS — D485 Neoplasm of uncertain behavior of skin: Secondary | ICD-10-CM | POA: Diagnosis not present

## 2019-12-26 DIAGNOSIS — Z85828 Personal history of other malignant neoplasm of skin: Secondary | ICD-10-CM | POA: Diagnosis not present

## 2019-12-26 NOTE — Telephone Encounter (Signed)
Patient states he is returning a call from Friday around 5:00pm. I did not see any notes.

## 2020-01-04 DIAGNOSIS — I482 Chronic atrial fibrillation, unspecified: Secondary | ICD-10-CM | POA: Diagnosis not present

## 2020-01-10 ENCOUNTER — Telehealth: Payer: Self-pay

## 2020-01-10 DIAGNOSIS — I4891 Unspecified atrial fibrillation: Secondary | ICD-10-CM

## 2020-01-10 NOTE — Telephone Encounter (Signed)
Per Dr. Bettina Gavia patients monitor results:  Monitor with slow rate lets reduce his beta-blocker 50% I do not think he is going to do well in atrial fibrillation long-term and I would like him to see Dr. Curt Bears get him back in a regular rhythm.  Spoke with patient regarding results and recommendation.  Patient verbalizes understanding and is agreeable to plan of care. Advised patient to call back with any issues or concerns.

## 2020-01-19 ENCOUNTER — Ambulatory Visit (INDEPENDENT_AMBULATORY_CARE_PROVIDER_SITE_OTHER): Payer: Medicare Other | Admitting: Cardiology

## 2020-01-19 ENCOUNTER — Encounter: Payer: Self-pay | Admitting: Cardiology

## 2020-01-19 ENCOUNTER — Other Ambulatory Visit: Payer: Self-pay

## 2020-01-19 VITALS — BP 156/68 | HR 68 | Ht 64.0 in | Wt 159.0 lb

## 2020-01-19 DIAGNOSIS — I4819 Other persistent atrial fibrillation: Secondary | ICD-10-CM

## 2020-01-19 NOTE — Progress Notes (Signed)
Electrophysiology Office Note   Date:  01/19/2020   ID:  Christian Patton, DOB 1940-05-01, MRN [id number]  PCP:  Lowella Dandy, NP  Cardiologist:  Bettina Gavia Primary Electrophysiologist:  Kelee Cunningham Meredith Leeds, MD    Chief Complaint: AF   History of Present Illness: Christian Patton is a 79 y.o. male who is being seen today for the evaluation of AF at the request of Bettina Gavia, Hilton Cork, MD. Presenting today for electrophysiology evaluation.  He has a history of coronary artery disease with PCI in 2012, obstructive hypertrophic cardiomyopathy, type 2 diabetes, hypertension, hyperlipidemia, PE following joint replacement.  He also has been diagnosed with atrial fibrillation.  He wore a cardiac monitor that showed persistent atrial fibrillation without other arrhythmias.  He has developed exercise intolerance.  Today, he denies symptoms of palpitations, chest pain, shortness of breath, orthopnea, PND, lower extremity edema, claudication, dizziness, presyncope, syncope, bleeding, or neurologic sequela. The patient is tolerating medications without difficulties.  He feels well today.  Up to about a month ago, he was having significant shortness of breath.  Since then, he has done well and is without major complaints.  He also had fatigue.   Past Medical History:  Diagnosis Date  . Acute renal failure ([place]) 07/12/2014  . Arthralgia of both knees 06/15/2014  . Arthritis   . Atherosclerotic heart disease of native coronary artery without angina pectoris 2014-10-11   Overview:  Stented x 3; 2012;  cardiac cath clean arteries, 2014 Overview:  Overview:  Stented x 3; 2012;  cardiac cath clean arteries, 2014  . Chronic anticoagulation October 11, 2014  . 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 34.9cm/sec                No mass, no ascites Labs- thrombocytopenia Overview:  Overview:  genotype 1a; Stage II fibrosis, Bx. 2005; Tx'd with Harvoni x 24 weeks, 2015  . Coronary artery disease   . Diabetes mellitus without complication ([place]    TYPE 2  . Essential hypertension 10/11/14   Overview:  on meds Overview:  Overview:  on meds  . Exposure to hepatitis C 12/26/2016   Overview:  Diagnosed 1995 Genotype 1a Liver Bx- Stage II fibrosis, 2005 Treated with IFN + RBV x 24 weeks, no response, 2002  continued additional 2 years-2005 Re-treated with Harvoni, 24 weeks, 2015   Sustained viral eradication  Risks-blood transfusion, UGI bleed, perforated duodenal ulcer, 1966  . 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 Hepatitis    C       "Has taken Harvoni"  . History of other specified conditions presenting hazards to health 11-Oct-2014   Overview:  Overview:  perforated gastric ulcer  . Hypertension   . Hypertensive heart disease with heart failure ([place]) 2014-10-11  . 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 Obstructive hypertrophic cardiomyopathy ([place]) October 11, 2014  . Palpitation 01/27/2018  . Preoperative cardiovascular examination 12/29/2016  . Primary osteoarthritis of left knee 06/23/2014  . Pulmonary embolism without acute cor pulmonale ([place]) 10/11/2014  . Sudden cardiac death ([place]) 10-11-14  . Urine retention 07/12/2014   Past Surgical History:  Procedure Laterality Date  . CARDIAC CATHETERIZATION     x 3    Last one was in  2011 @ [facility]    . TOTAL KNEE ARTHROPLASTY Left 07/03/2014   Procedure: LEFT TOTAL KNEE ARTHOPLASTY;  Surgeon: Vickey Huger, MD;  Location: [facility];  Service: Orthopedics;  Laterality: Left;     Current Outpatient Medications  Medication Sig Dispense Refill  . apixaban (ELIQUIS) 5 MG TABS tablet Take 1 tablet (5 mg total) by mouth 2 (two) times daily. 60 tablet 3  . Calcium Carb-Cholecalciferol (CALCIUM-VITAMIN D) 500-200 MG-UNIT  tablet Take 1 tablet by mouth daily.    Marland Kitchen glipiZIDE (GLUCOTROL XL) 5 MG 24 hr tablet Take 1 tablet by mouth as needed. Take only when sugar is above 120.  0  . hydrochlorothiazide (HYDRODIURIL) 25 MG tablet TAKE 1 TABLET BY MOUTH DAILY IF BLOOD PRESSURE OVER 140/80 90 tablet 1  . MAGNESIUM GLYCINATE PLUS PO Take 1 tablet by mouth daily.    . Melatonin 10 MG TABS Take 1 tablet by mouth daily.    . metoprolol succinate (TOPROL-XL) 25 MG 24 hr tablet Take 12.5 mg by mouth daily.    . multivitamin-iron-minerals-folic acid (THERAPEUTIC-M) TABS tablet Take 1 tablet by mouth daily.    . Omega-3 Fatty Acids (FISH OIL) 600 MG CAPS Take 1 tablet by mouth daily.    . QUEtiapine (SEROQUEL) 25 MG tablet Take 50 mg by mouth at bedtime.     . rosuvastatin (CRESTOR) 5 MG tablet Take 1 tablet (5 mg total) by mouth daily. 90 tablet 3  . Zinc 50 MG TABS Take by mouth daily.     No current facility-administered medications for this visit.    Allergies:   Metformin and Methocarbamol   Social History:  The patient  reports that he quit smoking about 92 years ago. His smoking use included cigarettes. He has a 36.00 pack-year smoking history. He has quit using smokeless tobacco.  His smokeless tobacco use included chew. He reports that he does not drink alcohol and does not use drugs.   Family History:  The patient's family history includes Heart attack in his father and mother.    ROS:  Please see the history of present illness.   Otherwise, review of systems is positive for none.   All other systems are reviewed and negative.    PHYSICAL EXAM: VS:  BP (!) 156/68   Pulse 68   Ht _0  (1.626 m)   Wt 159 lb (72.1 kg)   SpO2 96%   BMI 27.29 kg/m  , BMI Body mass index is 27.29 kg/m. GEN: Well nourished, well developed, in no acute distress  HEENT: normal  Neck: no JVD, carotid bruits, or masses Cardiac: Irregular; no murmurs, rubs, or gallops,no edema  Respiratory:  clear to auscultation bilaterally,  normal work of breathing GI: soft, nontender, nondistended, + BS MS: no deformity or atrophy  Skin: warm and dry Neuro:  Strength and sensation are intact Psych: euthymic mood, full affect  EKG:  EKG is ordered today. Personal review of the ekg ordered shows atrial fibrillation, rate 68  Recent Labs: 12/21/2019: ALT 18; BUN 30; Creatinine, Ser 1.20; Hemoglobin 13.9; Platelets 234; Potassium 4.3; Sodium 140    Lipid Panel  No results found for: CHOL, TRIG, HDL, CHOLHDL, VLDL, LDLCALC, LDLDIRECT   Wt Readings from Last 3 Encounters:  01/19/20 159 lb (72.1 kg)  12/21/19 159 lb (72.1 kg)  09/06/19 160 lb 3.2 oz (72.7 kg)      Other studies Reviewed: Additional studies/ records that were reviewed today include: TTE  04/07/19  Review of the above records today demonstrates:   1. Left ventricular ejection fraction, by estimation, is 60 to 65%. The  left ventricle has normal function. The left ventricle has no regional  wall motion abnormalities. There is severe concentric left ventricular  hypertrophy without SAM or LVOT  gradient at rest. Left ventricular diastolic parameters are indeterminate.  2. Right ventricular systolic function is normal. The right ventricular  size is normal. There is normal pulmonary artery systolic pressure.  3. Left atrial size was severely dilated.  4. Mean gradient of 10 mm Hg at HR 58 BPM. The mitral valve is  degenerative. No evidence of mitral valve regurgitation. Mild to moderate  mitral stenosis.  5. The aortic valve is tricuspid. Aortic valve regurgitation is mild.  Mild aortic valve sclerosis is present, with no evidence of aortic valve  stenosis.  6. The inferior vena cava is normal in size with greater than 50%  respiratory variability, suggesting right atrial pressure of 3 mmHg.   Monitor 01/10/20 personally reviewed atrial fibrillation with a relatively slow ventricular rate and frequent ventricular ectopy.    ASSESSMENT AND  PLAN:  1.  Persistent atrial fibrillation: CHA2DS2-VASc of 3.  Currently on Eliquis.  It is unclear to me whether or not he has symptoms from his atrial fibrillation.  He did have some shortness of breath and fatigue.  I am worried that his symptoms came on slowly and thus he did not recognize them.  I do feel that he would likely benefit from a rhythm control strategy.  I discussed with him the possibility of dofetilide initiation versus ablation.  Due to his comorbidities, other antiarrhythmics would not be ideal.  He wishes discuss this further with his primary cardiologist.  We Kabella Cassidy discuss with him after his appointment.  2.  Coronary artery disease: Status post stent in 2012.  No current chest pain.  3.  Hypertension: Elevated today.  He feels that this is due to increased salt intake.  He Javelle Donigan also discuss this with his primary cardiologist.  Case discussed with primary cardiology   Current medicines are reviewed at length with the patient today.   The patient does not have concerns regarding his medicines.  The following changes were made today:  none  Labs/ tests ordered today include:  Orders Placed This Encounter  Procedures  . EKG 12-Lead     Disposition:   FU with Holt Woolbright 3 months  Signed, Shamia Uppal Meredith Leeds, MD  01/19/2020 11:37 AM     [facility] [address] [place] [address] [phone] (office) [phone] (fax)

## 2020-01-19 NOTE — Patient Instructions (Signed)
Medication Instructions:  Your physician recommends that you continue on your current medications as directed. Please refer to the Current Medication list given to you today.  *If you need a refill on your cardiac medications before your next appointment, please call your pharmacy*   Lab Work: None ordered If you have labs (blood work) drawn today and your tests are completely normal, you will receive your results only by: Marland Kitchen MyChart Message (if you have MyChart) OR . A paper copy in the mail If you have any lab test that is abnormal or we need to change your treatment, we will call you to review the results.   Testing/Procedures: Your physician has requested that you have cardiac CT within 7 days PRIOR to ablation. Cardiac computed tomography (CT) is a painless test that uses an x-ray machine to take clear, detailed pictures of your heart. Please follow instructions below located under "other instructions".   The office will call to schedule this testing  Your physician has recommended that you have an ablation. Catheter ablation is a medical procedure used to treat some cardiac arrhythmias (irregular heartbeats). During catheter ablation, a long, thin, flexible tube is put into a blood vessel in your groin (upper thigh), or neck. This tube is called an ablation catheter. It is then guided to your heart through the blood vessel. Radio frequency waves destroy small areas of heart tissue where abnormal heartbeats may cause an arrhythmia to start. Please see the instruction sheet given to you today.  The following dates are available (these are subject to change): 03/14/20,  2/4, 2/8, 2/10, 2/23   Follow-Up: At [facility], you and your health needs are our priority.  As part of our continuing mission to provide you with exceptional heart care, we have created designated Provider Care Teams.  These Care Teams include your primary Cardiologist (physician) and Advanced Practice Providers (APPs -   Physician Assistants and Nurse Practitioners) who all work together to provide you with the care you need, when you need it.  Your next appointment:   4 week(s) after your ablation   The format for your next appointment:   In Person  Provider:   You will follow up in the [facility] located at [facility]. Your provider will be: Roderic Palau, NP or Clint R. [place], PA-C    Thank you for choosing [facility]!!   Trinidad Curet, RN [phone]   Other Instructions  CT INSTRUCTIONS Your cardiac CT will be scheduled at:  [facility] [address] [place], [place] [address] [phone]  Please arrive at the [facility] main entrance of [facility] 30 minutes prior to test start time. Proceed to the [facility] Radiology Department (first floor) to check-in and test prep.  Please follow these instructions carefully (unless otherwise directed):  Hold all erectile dysfunction medications at least 3 days (72 hrs) prior to test.  On the Night Before the Test: . Be sure to Drink plenty of water. . Do not consume any caffeinated/decaffeinated beverages or chocolate 12 hours prior to your test. . Do not take any antihistamines 12 hours prior to your test.  On the Day of the Test: . Drink plenty of water. Do not drink any water within one hour of the test. . Do not eat any food 4 hours prior to the test. . You may take your regular medications prior to the test.  . Take metoprolol (Lopressor) two hours prior to test. .  HOLD Furosemide/Hydrochlorothiazide morning of the test.      After the Test: . Drink plenty of water. . After receiving IV contrast, you may experience a mild flushed feeling. This is normal. . On occasion, you may experience a mild rash up to 24 hours after the test. This is not dangerous. If this occurs, you can take Benadryl 25 mg and increase your fluid intake. . If you experience trouble breathing, this  can be serious. If it is severe call 911 IMMEDIATELY. If it is mild, please call our office. . If you take any of these medications: Glipizide/Metformin, Avandament, Glucavance, please do not take 48 hours after completing test unless otherwise instructed.   Once we have confirmed authorization from your insurance company, we will call you to set up a date and time for your test. Based on how quickly your insurance processes prior authorizations requests, please allow up to 4 weeks to be contacted for scheduling your Cardiac CT appointment. Be advised that routine Cardiac CT appointments could be scheduled as many as 8 weeks after your provider has ordered it.  For non-scheduling related questions, please contact the cardiac imaging nurse navigator should you have any questions/concerns: Marchia Bond, Cardiac Imaging Nurse Navigator Burley Saver, Interim Cardiac Imaging Nurse [facility] Direct Office Dial: [phone]   For scheduling needs, including cancellations and rescheduling, please call [place], [phone].     Electrophysiology/Ablation Procedure Instructions   You are scheduled for a(n)  ablation on _________ with Dr. Allegra Lai.   1.   Pre procedure testing-             A.  LAB WORK --- On ___________  for your pre procedure blood work. You do NOT need to be fasting.  You can stop by the [facility] office for this (avoid stopping by at lunch time hours)                B. COVID TEST-- On ___________ @ ____________ - This is a Drive Up Visit at [address] [place], [place] [address].  Someone will direct you to the appropriate testing line. Stay in your car and someone will be with you shortly.   After you are tested please go home and self quarantine until the day of your procedure.     2. On the day of your procedure ____________ you will go to [facility] [phone] at ___________.  You will go to the main entrance A  [employer]) and enter where the [employer] parking staff are.  Your driver will drop you off and you will head down the hallway to ADMITTING.  You may have one support person come in to the hospital with you.  They will be asked to wait in the waiting room.   3.   Do not eat or drink after midnight prior to your procedure.   4.   Do not miss any doses of your blood thinner prior to the morning of your procedure or your procedure will need to be rescheduled.       Do NOT take any medications the morning of your procedure.   5.  Plan for an overnight stay, but you may be discharged home after your procedure.    If you use your phone frequently bring your phone charger, in case you have to stay.  If you are discharged after your procedure you will need someone to drive you home and be with  your for 24 hours after your procedure.   6. You will follow up with the AFIB clinic 4 weeks after your procedure.  You will follow up with Dr. Curt Bears  3 months after your procedure.  These appointments will be made for you.   * If you have ANY questions please call the office [phone] and ask for Joslyne Marshburn RN or send me a MyChart message   * Occasionally, EP Studies and ablations can become lengthy.  Please make your family aware of this before your procedure starts.  Average time ranges from 2-8 hours for EP studies/ablations.  Your physician will call your family after the procedure with the results.                                    Cardiac Ablation  Cardiac ablation is a procedure to stop some heart tissue from causing problems. The heart has many electrical connections. Sometimes these connections make the heart beat very fast or irregularly. Removing some problem areas can improve the heart rhythm or make it normal. What happens before the procedure?  Follow instructions from your doctor about what you cannot eat or drink.  Ask your doctor about: ? Changing or stopping your normal medicines. This is  important if you take diabetes medicines or blood thinners. ? Taking medicines such as aspirin and ibuprofen. These medicines can thin your blood. Do not take these medicines before your procedure if your doctor tells you not to.  Plan to have someone take you home.  If you will be going home right after the procedure, plan to have someone with you for 24 hours. What happens during the procedure?  To lower your risk of infection: ? Your health care team will wash or sanitize their hands. ? Your skin will be washed with soap. ? Hair may be removed from your neck or groin.  An IV tube will be put into one of your veins.  You will be given a medicine to help you relax (sedative).  Skin on your neck or groin will be numbed.  A cut (incision) will be made in your neck or groin.  A needle will be put through your cut and into a vein in your neck or groin.  A tube (catheter) will be put into the needle. The tube will be moved to your heart. X-rays (fluoroscopy) will be used to help guide the tube.  Small devices (electrodes) on the tip of the tube will send out electrical currents.  Dye may be put through the tube. This helps your surgeon see your heart.  Electrical energy will be used to scar (ablate) some heart tissue. Your surgeon may use: ? Heat (radiofrequency energy). ? Laser energy. ? Extreme cold (cryoablation).  The tube will be taken out.  Pressure will be held on your cut. This helps stop bleeding.  A bandage (dressing) will be put on your cut. The procedure may vary. What happens after the procedure?  You will be monitored until your medicines have worn off.  Your cut will be watched for bleeding. You will need to lie still for a few hours.  Do not drive for 24 hours or as long as your doctor tells you. Summary  Cardiac ablation is a procedure to stop some heart tissue from causing problems.  Electrical energy will be used to scar (ablate) some heart  tissue. This information is not  intended to replace advice given to you by your health care provider. Make sure you discuss any questions you have with your health care provider. Document Revised: 01/09/2017 Document Reviewed: 12/17/2015 Elsevier Patient Education  [place].   Dofetilide capsules (Tikosyn) What is this medicine? DOFETILIDE (doe FET il ide) is an antiarrhythmic drug. It helps make your heart beat regularly. This medicine also helps to slow rapid heartbeats. This medicine may be used for other purposes; ask your health care provider or pharmacist if you have questions. COMMON BRAND NAME(S): Tikosyn What should I tell my health care provider before I take this medicine? They need to know if you have any of these conditions:  heart disease  history of irregular heartbeat  history of low levels of potassium or magnesium in the blood  kidney disease  liver disease  an unusual or allergic reaction to dofetilide, other medicines, foods, dyes, or preservatives  pregnant or trying to get pregnant  breast-feeding How should I use this medicine? Take this medicine by mouth with a glass of water. Follow the directions on the prescription label. Do not take with grapefruit juice. You can take it with or without food. If it upsets your stomach, take it with food. Take your medicine at regular intervals. Do not take it more often than directed. Do not stop taking except on your doctor's advice. A special MedGuide will be given to you by the pharmacist with each prescription and refill. Be sure to read this information carefully each time. Talk to your pediatrician regarding the use of this medicine in children. Special care may be needed. Overdosage: If you think you have taken too much of this medicine contact a poison control center or emergency room at once. NOTE: This medicine is only for you. Do not share this medicine with others. What if I miss a dose? If you  miss a dose, skip it. Take your next dose at the normal time. Do not take extra or 2 doses at the same time to make up for the missed dose. What may interact with this medicine? Do not take this medicine with any of the following medications:  cimetidine  cisapride  dolutegravir  dronedarone  hydrochlorothiazide  ketoconazole  megestrol  pimozide  prochlorperazine  thioridazine  trimethoprim  verapamil This medicine may also interact with the following medications:  amiloride  cannabinoids  certain antibiotics like erythromycin or clarithromycin  certain antiviral medicines for HIV or hepatitis  certain medicines for depression, anxiety, or psychotic disorders  digoxin  diltiazem  grapefruit juice  metformin  nefazodone  other medicines that prolong the QT interval (an abnormal heart rhythm)  quinine  triamterene  zafirlukast  ziprasidone This list may not describe all possible interactions. Give your health care provider a list of all the medicines, herbs, non-prescription drugs, or dietary supplements you use. Also tell them if you smoke, drink alcohol, or use illegal drugs. Some items may interact with your medicine. What should I watch for while using this medicine? Your condition will be monitored carefully while you are receiving this medicine. What side effects may I notice from receiving this medicine? Side effects that you should report to your doctor or health care professional as soon as possible:  allergic reactions like skin rash, itching or hives, swelling of the face, lips, or tongue  breathing problems  chest pain or chest tightness  dizziness  signs and symptoms of a dangerous change in heartbeat or heart rhythm like chest pain;  dizziness; fast or irregular heartbeat; palpitations; feeling faint or lightheaded, falls; breathing problems  signs and symptoms of electrolyte imbalance like severe diarrhea, unusual sweating,  vomiting, loss of appetite, increased thirst  swelling of the ankles, legs, or feet  tingling, numbness in the hands or feet Side effects that usually do not require medical attention (report to your doctor or health care professional if they continue or are bothersome):  diarrhea  general ill feeling or flu-like symptoms  headache  nausea  trouble sleeping  stomach pain This list may not describe all possible side effects. Call your doctor for medical advice about side effects. You may report side effects to FDA at 1-800-FDA-1088. Where should I keep my medicine? Keep out of the reach of children. Store at room temperature between 15 and 30 degrees C (59 and 86 degrees F). Throw away any unused medicine after the expiration date. NOTE: This sheet is a summary. It may not cover all possible information. If you have questions about this medicine, talk to your doctor, pharmacist, or health care provider.  2020 Elsevier/Gold Standard (2018-01-18 10:18:48)    Preparing for Tikosyn (Dofetilide) Admission  1) Check with insurance company for cost of drug to ensure affordability.  Dofetilide 500 mcg twice a day  2) Tikosyn admission requires a 3 day hospital stay with constant telemetry monitoring. You will have and EKG after each dose of Tikosyn.  If the drug does not convert you to normal rhythm a cardioversion will be performed prior to discharge.  3) A pharmacist will review all of your medications.  If there are any interactions noted we will be in touch with you.  4) Please ensure no missed doses of your anticoagulation (blood thinner) for the past 3 weeks prior to admission.  5) No Benadryl is allowed 3 days prior to admission.  6) On day of admission - you will come in for office visit in the [facility] where lab work will be drawn.  IF the labs are acceptable an inpatient bed will be requested.  7) Normally admission to a bed is straight from our office.  This is all  dependent on bed availability.  In some instances you may be sent home and bed placement will call later in the day when a bed is available.  8) You may bring personal belongings/clothing with you to the hospital.  Please leave your suitcase in the care until you arrive in admissions.  Questions - please call the office at [phone]

## 2020-01-26 ENCOUNTER — Institutional Professional Consult (permissible substitution): Payer: Medicare Other | Admitting: Cardiology

## 2020-01-28 NOTE — Progress Notes (Signed)
Cardiology Office Note:    Date:  01/30/2020   ID:  Christian Patton, DOB [birth year], MRN [id number]  PCP:  Lowella Dandy, NP  Cardiologist:  Shirlee More, MD    Referring MD: Lowella Dandy, NP    ASSESSMENT:    1. Persistent atrial fibrillation ([place])   2. Chronic anticoagulation   3. Obstructive hypertrophic cardiomyopathy ([place])   4. Coronary artery disease of native artery of native heart with stable angina pectoris ([place])   5. Hypertensive heart disease with heart failure ([place])   6. Mixed hyperlipidemia    PLAN:    In order of problems listed above:  1. He appears to be in paroxysmal atrial fibrillation.  Our plan is a cautious approach screen his heart rhythm at home and if his burden remains low to moderate continue beta-blocker and anticoagulant. 2. Stable asymptomatic from his obstructive hypertrophic cardiomyopathy continue beta-blocker 3. Stable CAD no angina on current medical therapy New York Heart Association class I at this time I would not advise an ischemia evaluation 4. BP at target heart failure compensated at this time does not require loop diuretic 5. Continue his high intensity statin   Next appointment: 3 months   Medication Adjustments/Labs and Tests Ordered: Current medicines are reviewed at length with the patient today.  Concerns regarding medicines are outlined above.  No orders of the defined types were placed in this encounter.  No orders of the defined types were placed in this encounter.   No chief complaint on file.   History of Present Illness:    Christian Patton is a 79 y.o. male with a hx of CAD with previous PCI in 2012 obstructive hypertrophic cardiomyopathy type 2 diabetes hypertension with heart failure hyperlipidemia and a history of pulmonary embolism following lower extremity joint replacement surgery.  He was last seen 12/21/2019 with new onset atrial fibrillation.  Initially he was quite symptomatic with exercise intolerance and shortness  of breath.  Utilized an event monitor showed a relatively slow ventricular rate and frequent ventricular ectopy with couplets and one triplet.  He was seen consultation Dr. Curt Bears who advised a rhythm control strategy with inpatient initiation of dofetilide and Mr. Corbo deferred until he saw me in the office in follow-up. Compliance with diet, lifestyle and medications: Yes  Christian Patton is doing much better and has little or no atrial fibrillation screening with his blood pressure cuff.  Will run a do here is take a conservative approach he is going to buy the adapter for the iPhone record his heart rhythm daily and if his burden of atrial fibrillation symptoms remain low working to continue her current approach.  He is obviously in sinus rhythm today.  He has no exercise intolerance shortness of breath chest pain palpitation or syncope and tolerates his anticoagulant without bleeding complication Past Medical History:  Diagnosis Date  . Acute renal failure ([place]) 07/12/2014  . Arthralgia of both knees 06/15/2014  . Arthritis   . Atherosclerotic heart disease of native coronary artery without angina pectoris 09/13/2014   Overview:  Stented x 3; 2012;  cardiac cath clean arteries, 2014 Overview:  Overview:  Stented x 3; 2012;  cardiac cath clean arteries, 2014  . Chronic anticoagulation 09/13/2014  . 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 34.9cm/sec               No mass, no ascites Labs- thrombocytopenia Overview:  Overview:  genotype 1a; Stage II fibrosis, Bx. 2005; Tx'd with Harvoni x 24 weeks, 2015  . Coronary artery disease   . Diabetes mellitus without complication ([place])    TYPE 2  . Essential hypertension 2014-10-12   Overview:  on meds Overview:  Overview:  on meds  . 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 Hepatitis    C       "Has taken Harvoni"  . History of other specified conditions presenting hazards to health 10-12-14   Overview:  Overview:  perforated gastric ulcer  . Hypertension   . Hypertensive heart disease with heart failure ([place]) 10/12/2014  . 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 Obstructive hypertrophic cardiomyopathy ([place]) 10-12-2014  . Palpitation 01/27/2018  . Preoperative cardiovascular examination 12/29/2016  . Primary osteoarthritis of left knee 06/23/2014  . Pulmonary embolism without acute cor pulmonale ([place]) Oct 12, 2014  . Sudden cardiac death ([place]) Oct 12, 2014  . Urine retention 07/12/2014    Past Surgical History:  Procedure Laterality Date  . CARDIAC CATHETERIZATION     x 3    Last one was in 2011 @ [facility]    . TOTAL KNEE ARTHROPLASTY Left 07/03/2014   Procedure: LEFT TOTAL KNEE ARTHOPLASTY;  Surgeon: Vickey Huger, MD;  Location: [facility];  Service: Orthopedics;  Laterality: Left;    Current Medications: Current Meds  Medication Sig  . apixaban (ELIQUIS) 5 MG TABS tablet Take 1 tablet (5 mg total) by mouth 2 (two) times daily.  . Calcium Carb-Cholecalciferol (CALCIUM-VITAMIN D) 500-200 MG-UNIT tablet Take 1 tablet by mouth daily.  Marland Kitchen glipiZIDE (GLUCOTROL XL) 5 MG 24 hr tablet Take 1 tablet by mouth as needed. Take only when sugar is above 120.  . hydrochlorothiazide (HYDRODIURIL) 25 MG tablet TAKE 1 TABLET BY MOUTH DAILY IF BLOOD PRESSURE OVER 140/80  . MAGNESIUM GLYCINATE PLUS PO Take 1 tablet by mouth daily.  . Melatonin 10 MG TABS Take 1 tablet by mouth  daily.  . metoprolol succinate (TOPROL-XL) 25 MG 24 hr tablet Take 12.5 mg by mouth daily.  . multivitamin-iron-minerals-folic acid (THERAPEUTIC-M) TABS tablet Take 1 tablet by mouth daily.  . Omega-3 Fatty Acids (FISH OIL) 600 MG CAPS Take 1 tablet by mouth daily.  . QUEtiapine (SEROQUEL) 25 MG tablet Take 50 mg by mouth at bedtime.   . rosuvastatin (CRESTOR) 5 MG tablet Take 1 tablet (5 mg total) by mouth daily.  . Zinc 50 MG TABS Take by mouth daily.     Allergies:   Metformin and Methocarbamol   Social History   Socioeconomic History  . Marital status: Married    Spouse name: Not on file  . Number of children: Not on file  . Years of education: Not on file  . Highest education level: Not on file  Occupational History  . Not on file  Tobacco Use  . Smoking status: Former Smoker    Packs/day: 1.50    Years: 24.00  Pack years: 36.00    Types: Cigarettes    Quit date: 1930    Years since quitting: 92.0  . Smokeless tobacco: Former Systems developer    Types: Secondary school teacher  . Vaping Use: Never used  Substance and Sexual Activity  . Alcohol use: No    Comment: quit 30 yrs ago  . Drug use: No  . Sexual activity: Not on file  Other Topics Concern  . Not on file  Social History Narrative  . Not on file   Social Determinants of Health   Financial Resource Strain: Not on file  Food Insecurity: Not on file  Transportation Needs: Not on file  Physical Activity: Not on file  Stress: Not on file  Social Connections: Not on file     Family History: The patient's family history includes Heart attack in his father and mother. ROS:   Please see the history of present illness.    All other systems reviewed and are negative.  EKGs/Labs/Other Studies Reviewed:    The following studies were reviewed today:   Recent Labs: 12/21/2019: ALT 18; BUN 30; Creatinine, Ser 1.20; Hemoglobin 13.9; Platelets 234; Potassium 4.3; Sodium 140  Recent Lipid Panel No results found for: CHOL,  TRIG, HDL, CHOLHDL, VLDL, LDLCALC, LDLDIRECT  Physical Exam:    VS:  BP (!) 158/78   Pulse 64   Ht _0  (1.626 m)   Wt 157 lb 12.8 oz (71.6 kg)   SpO2 96%   BMI 27.09 kg/m     Wt Readings from Last 3 Encounters:  01/30/20 157 lb 12.8 oz (71.6 kg)  01/19/20 159 lb (72.1 kg)  12/21/19 159 lb (72.1 kg)     GEN:  Well nourished, well developed in no acute distress HEENT: Normal NECK: No JVD; No carotid bruits LYMPHATICS: No lymphadenopathy CARDIAC: 1/6 ejection murmur is 1 is RRR, no murmurs, rubs, gallops RESPIRATORY:  Clear to auscultation without rales, wheezing or rhonchi  ABDOMEN: Soft, non-tender, non-distended MUSCULOSKELETAL:  No edema; No deformity  SKIN: Warm and dry NEUROLOGIC:  Alert and oriented x 3 PSYCHIATRIC:  Normal affect    Signed, Shirlee More, MD  01/30/2020 3:13 PM    [facility]

## 2020-01-30 ENCOUNTER — Other Ambulatory Visit: Payer: Self-pay

## 2020-01-30 ENCOUNTER — Ambulatory Visit (INDEPENDENT_AMBULATORY_CARE_PROVIDER_SITE_OTHER): Payer: Medicare Other | Admitting: Cardiology

## 2020-01-30 ENCOUNTER — Encounter: Payer: Self-pay | Admitting: Cardiology

## 2020-01-30 VITALS — BP 158/78 | HR 64 | Ht 64.0 in | Wt 157.8 lb

## 2020-01-30 DIAGNOSIS — I110 Hypertensive heart disease with heart failure: Secondary | ICD-10-CM

## 2020-01-30 DIAGNOSIS — I421 Obstructive hypertrophic cardiomyopathy: Secondary | ICD-10-CM

## 2020-01-30 DIAGNOSIS — E782 Mixed hyperlipidemia: Secondary | ICD-10-CM | POA: Diagnosis not present

## 2020-01-30 DIAGNOSIS — I4819 Other persistent atrial fibrillation: Secondary | ICD-10-CM

## 2020-01-30 DIAGNOSIS — Z7901 Long term (current) use of anticoagulants: Secondary | ICD-10-CM | POA: Diagnosis not present

## 2020-01-30 DIAGNOSIS — I25118 Atherosclerotic heart disease of native coronary artery with other forms of angina pectoris: Secondary | ICD-10-CM | POA: Diagnosis not present

## 2020-01-30 NOTE — Patient Instructions (Addendum)
  Medication Instructions:  Your physician recommends that you continue on your current medications as directed. Please refer to the Current Medication list given to you today.  *If you need a refill on your cardiac medications before your next appointment, please call your pharmacy*   Lab Work: None. If you have labs (blood work) drawn today and your tests are completely normal, you will receive your results only by: Marland Kitchen MyChart Message (if you have MyChart) OR . A paper copy in the mail If you have any lab test that is abnormal or we need to change your treatment, we will call you to review the results.   Testing/Procedures: None.   Follow-Up: At [facility], you and your health needs are our priority.  As part of our continuing mission to provide you with exceptional heart care, we have created designated Provider Care Teams.  These Care Teams include your primary Cardiologist (physician) and Advanced Practice Providers (APPs -  Physician Assistants and Nurse Practitioners) who all work together to provide you with the care you need, when you need it.  We recommend signing up for the patient portal called "MyChart".  Sign up information is provided on this After Visit Summary.  MyChart is used to connect with patients for Virtual Visits (Telemedicine).  Patients are able to view lab/test results, encounter notes, upcoming appointments, etc.  Non-urgent messages can be sent to your provider as well.   To learn more about what you can do with MyChart, go to [email or web address].    Your next appointment:   3 month(s)  The format for your next appointment:   In Person  Provider:   Shirlee More, MD   Other Instructions    KardiaMobile [email or web address]        FDA-cleared, clinical grade mobile EKG monitor: Jodelle Red is the most clinically-validated mobile EKG used by the world's leading cardiac care medical professionals With Basic service,  know instantly if your heart rhythm is normal or if atrial fibrillation is detected, and email the last single EKG recording to yourself or your doctor Premium service, available for purchase through the Kardia app for $9.99 per month or $99 per year, includes unlimited history and storage of your EKG recordings, a monthly EKG summary report to share with your doctor, along with the ability to track your blood pressure, activity and weight Includes one KardiaMobile phone clip FREE SHIPPING: Standard delivery 1-3 business days. Orders placed by 11:00am PST will ship that afternoon. Otherwise, will ship next business day. All orders ship via [employer] from [place], [place]

## 2020-02-20 ENCOUNTER — Other Ambulatory Visit: Payer: Self-pay | Admitting: Cardiology

## 2020-03-20 DIAGNOSIS — Z86711 Personal history of pulmonary embolism: Secondary | ICD-10-CM | POA: Diagnosis not present

## 2020-03-20 DIAGNOSIS — I4819 Other persistent atrial fibrillation: Secondary | ICD-10-CM | POA: Diagnosis not present

## 2020-03-20 DIAGNOSIS — I2510 Atherosclerotic heart disease of native coronary artery without angina pectoris: Secondary | ICD-10-CM | POA: Diagnosis not present

## 2020-03-20 DIAGNOSIS — Z6826 Body mass index (BMI) 26.0-26.9, adult: Secondary | ICD-10-CM | POA: Diagnosis not present

## 2020-03-20 DIAGNOSIS — E118 Type 2 diabetes mellitus with unspecified complications: Secondary | ICD-10-CM | POA: Diagnosis not present

## 2020-03-20 DIAGNOSIS — E785 Hyperlipidemia, unspecified: Secondary | ICD-10-CM | POA: Diagnosis not present

## 2020-03-20 DIAGNOSIS — E1165 Type 2 diabetes mellitus with hyperglycemia: Secondary | ICD-10-CM | POA: Diagnosis not present

## 2020-03-20 DIAGNOSIS — F5104 Psychophysiologic insomnia: Secondary | ICD-10-CM | POA: Diagnosis not present

## 2020-03-20 DIAGNOSIS — I10 Essential (primary) hypertension: Secondary | ICD-10-CM | POA: Diagnosis not present

## 2020-03-26 ENCOUNTER — Telehealth: Payer: Self-pay | Admitting: Cardiology

## 2020-03-26 MED ORDER — ROSUVASTATIN CALCIUM 5 MG PO TABS: 5.0000 mg | ORAL_TABLET | Freq: Every day | ORAL | 1 refills | Status: DC

## 2020-03-26 NOTE — Telephone Encounter (Signed)
*  STAT* If patient is at the pharmacy, call can be transferred to refill team.   1. Which medications need to be refilled? (please list name of each medication and dose if known) Rosuvastatin  2. Which pharmacy/location (including street and city if local pharmacy) is medication to be sent to? [facility]  3. Do they need a 30 day or 90 day supply? 90 days and refills

## 2020-03-26 NOTE — Telephone Encounter (Signed)
Medication filled.

## 2020-04-10 DIAGNOSIS — Z01818 Encounter for other preprocedural examination: Secondary | ICD-10-CM | POA: Diagnosis not present

## 2020-04-10 DIAGNOSIS — E119 Type 2 diabetes mellitus without complications: Secondary | ICD-10-CM | POA: Diagnosis not present

## 2020-04-10 DIAGNOSIS — H2512 Age-related nuclear cataract, left eye: Secondary | ICD-10-CM | POA: Diagnosis not present

## 2020-04-10 DIAGNOSIS — Z7901 Long term (current) use of anticoagulants: Secondary | ICD-10-CM | POA: Diagnosis not present

## 2020-04-16 ENCOUNTER — Telehealth: Payer: Self-pay | Admitting: Cardiology

## 2020-04-16 DIAGNOSIS — K219 Gastro-esophageal reflux disease without esophagitis: Secondary | ICD-10-CM | POA: Diagnosis not present

## 2020-04-16 MED ORDER — APIXABAN 5 MG PO TABS: 5.0000 mg | ORAL_TABLET | Freq: Two times a day (BID) | ORAL | 3 refills | Status: DC

## 2020-04-16 NOTE — Telephone Encounter (Signed)
Refill sent in per request.  

## 2020-04-16 NOTE — Telephone Encounter (Signed)
*  STAT* If patient is at the pharmacy, call can be transferred to refill team.   1. Which medications need to be refilled? (please list name of each medication and dose if known)   apixaban (ELIQUIS) 5 MG TABS tablet    2. Which pharmacy/location (including street and city if local pharmacy) is medication to be sent to? [facility] [phone] - [place], [address] AT [address]  3. Do they need a 30 day or 90 day supply? 30 days  Pt only have 3 pills left

## 2020-04-17 ENCOUNTER — Telehealth: Payer: Self-pay

## 2020-04-17 DIAGNOSIS — H259 Unspecified age-related cataract: Secondary | ICD-10-CM | POA: Diagnosis not present

## 2020-04-17 DIAGNOSIS — I252 Old myocardial infarction: Secondary | ICD-10-CM | POA: Diagnosis not present

## 2020-04-17 DIAGNOSIS — Z7982 Long term (current) use of aspirin: Secondary | ICD-10-CM | POA: Diagnosis not present

## 2020-04-17 DIAGNOSIS — Z7984 Long term (current) use of oral hypoglycemic drugs: Secondary | ICD-10-CM | POA: Diagnosis not present

## 2020-04-17 DIAGNOSIS — K219 Gastro-esophageal reflux disease without esophagitis: Secondary | ICD-10-CM | POA: Diagnosis not present

## 2020-04-17 DIAGNOSIS — Z955 Presence of coronary angioplasty implant and graft: Secondary | ICD-10-CM | POA: Diagnosis not present

## 2020-04-17 DIAGNOSIS — E1136 Type 2 diabetes mellitus with diabetic cataract: Secondary | ICD-10-CM | POA: Diagnosis not present

## 2020-04-17 DIAGNOSIS — E785 Hyperlipidemia, unspecified: Secondary | ICD-10-CM | POA: Diagnosis not present

## 2020-04-17 DIAGNOSIS — I10 Essential (primary) hypertension: Secondary | ICD-10-CM | POA: Diagnosis not present

## 2020-04-17 DIAGNOSIS — I4891 Unspecified atrial fibrillation: Secondary | ICD-10-CM | POA: Diagnosis not present

## 2020-04-17 DIAGNOSIS — I110 Hypertensive heart disease with heart failure: Secondary | ICD-10-CM | POA: Diagnosis not present

## 2020-04-17 DIAGNOSIS — Z7902 Long term (current) use of antithrombotics/antiplatelets: Secondary | ICD-10-CM | POA: Diagnosis not present

## 2020-04-17 DIAGNOSIS — H2512 Age-related nuclear cataract, left eye: Secondary | ICD-10-CM | POA: Diagnosis not present

## 2020-04-17 DIAGNOSIS — Z79899 Other long term (current) drug therapy: Secondary | ICD-10-CM | POA: Diagnosis not present

## 2020-04-17 DIAGNOSIS — H25812 Combined forms of age-related cataract, left eye: Secondary | ICD-10-CM | POA: Diagnosis not present

## 2020-04-17 DIAGNOSIS — M1990 Unspecified osteoarthritis, unspecified site: Secondary | ICD-10-CM | POA: Diagnosis not present

## 2020-04-17 DIAGNOSIS — I509 Heart failure, unspecified: Secondary | ICD-10-CM | POA: Diagnosis not present

## 2020-04-17 NOTE — Telephone Encounter (Signed)
   [facility] Pre-operative Risk Assessment    HEARTCARE STAFF: - Please ensure there is not already an duplicate clearance open for this procedure. - Under Visit Info/Reason for Call, type in Other and utilize the format Clearance MM/DD/YY or Clearance TBD. Do not use dashes or single digits. - If request is for dental extraction, please clarify the # of teeth to be extracted.  Request for surgical clearance:  1. What type of surgery is being performed? Colonoscopy   2. When is this surgery scheduled? 05/08/2020   3. What type of clearance is required (medical clearance vs. Pharmacy clearance to hold med vs. Both)? Both  4. Are there any medications that need to be held prior to surgery and how long?Eliquis 05/06/2020   5. Practice name and name of physician performing surgery?  [facility]. Dr. Christia Reading Misenheimer.    6. What is the office phone number? [phone]   7.   What is the office fax number? [phone]  8.   Anesthesia type (None, local, MAC, general) ? Unspecified.     Gita Kudo 04/17/2020, 8:57 AM  _________________________________________________________________   (provider comments below)

## 2020-04-17 NOTE — Telephone Encounter (Signed)
Patient with diagnosis of afib on Eliquis for anticoagulation.    Procedure: colonoscopy Date of procedure: 05/08/2020  CHA2DS2-VASc Score = 6  This indicates a 9.7% annual risk of stroke. The patient's score is based upon: CHF History: Yes HTN History: Yes Diabetes History: Yes Stroke History: No Vascular Disease History: Yes Age Score: 2 Gender Score: 0     Of note pt does have hx of single PE after joint surgery CrCl 45 ml/min  Per office protocol, patient can hold Eliquis for 2 days prior to procedure.

## 2020-04-18 DIAGNOSIS — Z0000 Encounter for general adult medical examination without abnormal findings: Secondary | ICD-10-CM | POA: Diagnosis not present

## 2020-04-18 DIAGNOSIS — Z1331 Encounter for screening for depression: Secondary | ICD-10-CM | POA: Diagnosis not present

## 2020-04-18 DIAGNOSIS — Z9181 History of falling: Secondary | ICD-10-CM | POA: Diagnosis not present

## 2020-04-18 DIAGNOSIS — E785 Hyperlipidemia, unspecified: Secondary | ICD-10-CM | POA: Diagnosis not present

## 2020-04-18 NOTE — Telephone Encounter (Signed)
Pt states he decided against colonoscopy. I will remove from preop pool.

## 2020-04-24 ENCOUNTER — Telehealth: Payer: Self-pay

## 2020-04-24 NOTE — Telephone Encounter (Signed)
Please comment on anticoagulation and then we will contact the patient.  Kerin Ransom PA-C 04/24/2020 9:03 AM

## 2020-04-24 NOTE — Telephone Encounter (Addendum)
Duplicate clearance request, clearance previously addressed in 04/17/20 encounter. Looks as though patient decided to not proceed with colonoscopy as well.

## 2020-04-24 NOTE — Telephone Encounter (Signed)
   [facility] Pre-operative Risk Assessment    HEARTCARE STAFF: - Please ensure there is not already an duplicate clearance open for this procedure. - Under Visit Info/Reason for Call, type in Other and utilize the format Clearance MM/DD/YY or Clearance TBD. Do not use dashes or single digits. - If request is for dental extraction, please clarify the # of teeth to be extracted.  Request for surgical clearance:  1. What type of surgery is being performed? Colonoscopy   2. When is this surgery scheduled? 05/08/2020   3. What type of clearance is required (medical clearance vs. Pharmacy clearance to hold med vs. Both)? Both  4. Are there any medications that need to be held prior to surgery and how long? Eliquis two days prior   5. Practice name and name of physician performing surgery? [facility]. Dr. Lyda Jester.    6. What is the office phone number? [phone]   7.   What is the office fax number? [phone]  8.   Anesthesia type (None, local, MAC, general) ? General   Christian Patton 04/24/2020, 8:35 AM  _________________________________________________________________   (provider comments below)

## 2020-05-03 NOTE — Progress Notes (Signed)
Cardiology Office Note:    Date:  05/04/2020   ID:  Christian Patton, DOB [birth year], MRN [id number]  PCP:  Lowella Dandy, NP  Cardiologist:  Shirlee More, MD    Referring MD: Lowella Dandy, NP    ASSESSMENT:    1. Paroxysmal atrial fibrillation (HCC)   2. Chronic anticoagulation   3. Obstructive hypertrophic cardiomyopathy ([place])   4. Hypertensive heart disease with heart failure ([place])   5. Coronary artery disease of native artery of native heart with stable angina pectoris ([place])   6. Mixed hyperlipidemia    PLAN:    In order of problems listed above:  1. Badr is done well he has had no clinical recurrence of atrial fibrillation is pleased with the quality of his life and is compliant with his anticoagulant.  In order to screen him I asked him to purchase the iPhone adapter continue his anticoagulant low-dose beta-blocker. 2. Stable he is having no evidence of LV outflow tract obstruction clinically her most recent echocardiogram continue his beta-blocker 3. BP at target continue current treatment including a thiazide diuretic 4. Stable asymptomatic New York Heart Association class I on current medical therapy including his high intensity statin lipids are at target 5. And his request a prescription for Cialis for erectile dysfunction   Next appointment: 6 months   Medication Adjustments/Labs and Tests Ordered: Current medicines are reviewed at length with the patient today.  Concerns regarding medicines are outlined above.  No orders of the defined types were placed in this encounter.  Meds ordered this encounter  Medications   tadalafil (CIALIS) 5 MG tablet    Sig: Take 1 tablet (5 mg total) by mouth daily as needed for erectile dysfunction.    Dispense:  10 tablet    Refill:  0    Chief Complaint  Patient presents with   Follow-up   Atrial Fibrillation    History of Present Illness:    Christian Patton is a 80 y.o. male with a hx of paroxysmal atrial fibrillation  chronically anticoagulated obstructive hypertrophic cardiomyopathy coronary artery disease with previous PCI 2012 hypertensive heart disease with heart failure mixed hyperlipidemia last seen 01/30/2020 he was in atrial fibrillation at that time and and shows a cost cautious approach of rate control and for cardioversion..  Compliance with diet, lifestyle and medications: Yes  3-day event monitor reported 01/10/2020 showed atrial fibrillation with a relatively slow ventricular rate and frequent ventricular ectopy without ventricular tachycardia. Echocardiogram 04/06/2019 showed EF of 60 to 65% with severe concentric left ventricular hypertrophy without left ventricular outflow tract obstruction.  His left atrium was severely dilated there is calcific mitral valve disease with mild to moderate mitral stenosis and aortic valve sclerosis and mild aortic regurgitation. He feels well follows his heart rhythm at home which has been regular on a BP monitor and is not having any exercise intolerance dyspnea chest pain palpitation or syncope.  I encouraged Christian Patton his wife to purchase the alive core adapter to screen rhythm at home. He tolerates his anticoagulant without bleeding statin without muscle pain or weakness. He has had no chest pain.  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 34.9cm/sec               No mass, no ascites Labs- thrombocytopenia Overview:  Overview:  genotype 1a; Stage II fibrosis, Bx. 2005; Tx'd with Harvoni x 24 weeks, 2015   Coronary artery disease    Diabetes mellitus without complication ([place])    TYPE 2   Essential hypertension Sep 29, 2014   Overview:  on meds Overview:  Overview:  on meds   Exposure to hepatitis C 12/26/2016   Overview:  Diagnosed 1995 Genotype 1a Liver Bx- Stage II fibrosis, 2005 Treated with IFN + RBV x 24 weeks, no response, 2002  continued additional 2 years-2005 Re-treated with Harvoni, 24 weeks, 2015   Sustained viral eradication  Risks-blood transfusion, UGI bleed, perforated duodenal ulcer, 1966   GERD (gastroesophageal reflux disease)    d/t barretts esphagus   Hepatitis    C       "Has taken Harvoni"   History of other specified conditions presenting hazards to health 09/29/2014   Overview:  Overview:  perforated gastric ulcer   Hypertension    Hypertensive heart disease with heart failure ([place]) 09/29/2014   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 07/03/2014   Obstructive hypertrophic cardiomyopathy ([place]) 29-Sep-2014   Palpitation 01/27/2018   Preoperative cardiovascular examination 12/29/2016   Primary osteoarthritis of left knee 06/23/2014   Pulmonary embolism without acute cor pulmonale ([place]) 2014/09/29   Sudden cardiac death ([place]) 09-29-2014   Urine retention 07/12/2014    Past Surgical History:  Procedure Laterality Date   CARDIAC CATHETERIZATION     x 3    Last one was in 2011 @ [facility] Left 07/03/2014   Procedure: LEFT TOTAL KNEE ARTHOPLASTY;  Surgeon: Vickey Huger, MD;  Location: [facility];  Service: Orthopedics;  Laterality: Left;    Current Medications: Current Meds  Medication Sig   apixaban (ELIQUIS) 5 MG TABS tablet Take 1 tablet (5 mg total) by mouth 2 (two) times daily.   Ascorbic Acid  (VITAMIN C) 1000 MG tablet Take 1,000 mg by mouth daily.   Calcium Carb-Cholecalciferol (CALCIUM-VITAMIN D) 500-200 MG-UNIT tablet Take 1 tablet by mouth daily.   glipiZIDE (GLUCOTROL XL) 5 MG 24 hr tablet Take 1 tablet by mouth as needed. Take only when sugar is above 120.   hydrochlorothiazide (HYDRODIURIL) 25 MG tablet TAKE 1 TABLET BY MOUTH EVERY DAY IF BLOOD PRESSURE OVER 140/80   MAGNESIUM GLYCINATE PLUS PO Take 200 mg by mouth daily.   Melatonin 10 MG TABS Take 1 tablet by mouth at bedtime.   metoprolol succinate (TOPROL-XL) 25 MG 24 hr tablet Take 12.5 mg by mouth daily.   multivitamin-iron-minerals-folic acid (THERAPEUTIC-M) TABS tablet Take 1 tablet by mouth daily.   Quercetin 250 MG TABS Take 500 mg by mouth daily.   QUEtiapine (SEROQUEL) 25 MG tablet Take 50 mg by mouth at bedtime.    rosuvastatin (CRESTOR) 5 MG tablet Take 1 tablet (5 mg total) by mouth daily.   tadalafil (CIALIS) 5 MG tablet Take 1 tablet (5 mg total)  by mouth daily as needed for erectile dysfunction.   Zinc 50 MG TABS Take by mouth daily.     Allergies:   Metformin and Methocarbamol   Social History   Socioeconomic History   Marital status: Married    Spouse name: Not on file   Number of children: Not on file   Years of education: Not on file   Highest education level: Not on file  Occupational History   Not on file  Tobacco Use   Smoking status: Former Smoker    Packs/day: 1.50    Years: 24.00    Pack years: 36.00    Types: Cigarettes    Quit date: 63    Years since quitting: 92.2   Smokeless tobacco: Former Systems developer    Types: Nurse, children's Use: Never used  Substance and Sexual Activity   Alcohol use: No    Comment: quit 30 yrs ago   Drug use: No   Sexual activity: Not on file  Other Topics Concern   Not on file  Social History Narrative   Not on file   Social Determinants of Health   Financial Resource Strain: Not on file  Food Insecurity: Not on  file  Transportation Needs: Not on file  Physical Activity: Not on file  Stress: Not on file  Social Connections: Not on file     Family History: The patient's family history includes Heart attack in his father and mother. ROS:   Please see the history of present illness.    All other systems reviewed and are negative.  EKGs/Labs/Other Studies Reviewed:    The following studies were reviewed today: Most recent labs 0 03/20/2020: Cholesterol at target 154 LDL 88 triglycerides 112 HDL 46 A1c elevated 7.3%  Recent Labs: 12/21/2019: ALT 18; BUN 30; Creatinine, Ser 1.20; Hemoglobin 13.9; Platelets 234; Potassium 4.3; Sodium 140  Recent Lipid Panel No results found for: CHOL, TRIG, HDL, CHOLHDL, VLDL, LDLCALC, LDLDIRECT  Physical Exam:    VS:  BP 136/74    Pulse (!) 58    Ht 5' 4" (1.626 m)    Wt 154 lb (69.9 kg)    SpO2 96%    BMI 26.43 kg/m     Wt Readings from Last 3 Encounters:  05/04/20 154 lb (69.9 kg)  01/30/20 157 lb 12.8 oz (71.6 kg)  01/19/20 159 lb (72.1 kg)     GEN: There is his age well nourished, well developed in no acute distress HEENT: Normal NECK: No JVD; No carotid bruits LYMPHATICS: No lymphadenopathy CARDIAC: RRR, no murmurs, rubs, gallops RESPIRATORY:  Clear to auscultation without rales, wheezing or rhonchi  ABDOMEN: Soft, non-tender, non-distended MUSCULOSKELETAL:  No edema; No deformity  SKIN: Warm and dry NEUROLOGIC:  Alert and oriented x 3 PSYCHIATRIC:  Normal affect    Signed, Shirlee More, MD  05/04/2020 10:40 AM    [place]

## 2020-05-04 ENCOUNTER — Encounter: Payer: Self-pay | Admitting: Cardiology

## 2020-05-04 ENCOUNTER — Ambulatory Visit (INDEPENDENT_AMBULATORY_CARE_PROVIDER_SITE_OTHER): Payer: Medicare Other | Admitting: Cardiology

## 2020-05-04 ENCOUNTER — Other Ambulatory Visit: Payer: Self-pay

## 2020-05-04 VITALS — BP 136/74 | HR 58 | Ht 64.0 in | Wt 154.0 lb

## 2020-05-04 DIAGNOSIS — E782 Mixed hyperlipidemia: Secondary | ICD-10-CM

## 2020-05-04 DIAGNOSIS — Z7901 Long term (current) use of anticoagulants: Secondary | ICD-10-CM

## 2020-05-04 DIAGNOSIS — I421 Obstructive hypertrophic cardiomyopathy: Secondary | ICD-10-CM | POA: Diagnosis not present

## 2020-05-04 DIAGNOSIS — I25118 Atherosclerotic heart disease of native coronary artery with other forms of angina pectoris: Secondary | ICD-10-CM | POA: Diagnosis not present

## 2020-05-04 DIAGNOSIS — I110 Hypertensive heart disease with heart failure: Secondary | ICD-10-CM | POA: Diagnosis not present

## 2020-05-04 DIAGNOSIS — I480 Paroxysmal atrial fibrillation: Secondary | ICD-10-CM

## 2020-05-04 MED ORDER — TADALAFIL 5 MG PO TABS: 5.0000 mg | ORAL_TABLET | Freq: Every day | ORAL | 0 refills | Status: DC | PRN

## 2020-05-04 NOTE — Patient Instructions (Signed)
Medication Instructions:   Your physician has recommended you make the following change in your medication:  START: Cialis 5 mg take one tablet by mouth daily as needed.  *If you need a refill on your cardiac medications before your next appointment, please call your pharmacy*   Lab Work: None If you have labs (blood work) drawn today and your tests are completely normal, you will receive your results only by: Marland Kitchen MyChart Message (if you have MyChart) OR . A paper copy in the mail If you have any lab test that is abnormal or we need to change your treatment, we will call you to review the results.   Testing/Procedures: None   Follow-Up: At [facility], you and your health needs are our priority.  As part of our continuing mission to provide you with exceptional heart care, we have created designated Provider Care Teams.  These Care Teams include your primary Cardiologist (physician) and Advanced Practice Providers (APPs -  Physician Assistants and Nurse Practitioners) who all work together to provide you with the care you need, when you need it.  We recommend signing up for the patient portal called "MyChart".  Sign up information is provided on this After Visit Summary.  MyChart is used to connect with patients for Virtual Visits (Telemedicine).  Patients are able to view lab/test results, encounter notes, upcoming appointments, etc.  Non-urgent messages can be sent to your provider as well.   To learn more about what you can do with MyChart, go to [email or web address].    Your next appointment:   6 month(s)  The format for your next appointment:   In Person  Provider:   Shirlee More, MD   Other Instructions

## 2020-05-07 ENCOUNTER — Telehealth: Payer: Self-pay

## 2020-05-07 MED ORDER — TADALAFIL 5 MG PO TABS: 5.0000 mg | ORAL_TABLET | Freq: Every day | ORAL | 0 refills | Status: DC | PRN

## 2020-05-07 NOTE — Telephone Encounter (Signed)
Refill sent to pharmacy.   

## 2020-05-09 ENCOUNTER — Telehealth: Payer: Self-pay | Admitting: Cardiology

## 2020-05-09 NOTE — Telephone Encounter (Signed)
Patient called to see what is the hold up with the patient's medication. Please call back to discuss

## 2020-05-09 NOTE — Telephone Encounter (Signed)
Spoke to the patient just now and he let me know that the pharmacy told him that they need a prior authorization for his Cialis.  I tried doing the prior auth for this on CoverMyMeds but am having trouble verifying the patients eligibility at this time. I have confirmed the patients card numbers with the pharmacy. I am seeking assistance with this at this time.

## 2020-05-11 ENCOUNTER — Telehealth: Payer: Self-pay

## 2020-05-11 NOTE — Telephone Encounter (Signed)
Prior Auth for Cialis was denied. Confirmation#[id number]

## 2020-07-19 DIAGNOSIS — I480 Paroxysmal atrial fibrillation: Secondary | ICD-10-CM | POA: Diagnosis not present

## 2020-07-19 DIAGNOSIS — F5104 Psychophysiologic insomnia: Secondary | ICD-10-CM | POA: Diagnosis not present

## 2020-07-19 DIAGNOSIS — Z86711 Personal history of pulmonary embolism: Secondary | ICD-10-CM | POA: Diagnosis not present

## 2020-07-19 DIAGNOSIS — Z6825 Body mass index (BMI) 25.0-25.9, adult: Secondary | ICD-10-CM | POA: Diagnosis not present

## 2020-07-19 DIAGNOSIS — E785 Hyperlipidemia, unspecified: Secondary | ICD-10-CM | POA: Diagnosis not present

## 2020-07-19 DIAGNOSIS — I2510 Atherosclerotic heart disease of native coronary artery without angina pectoris: Secondary | ICD-10-CM | POA: Diagnosis not present

## 2020-07-19 DIAGNOSIS — E1165 Type 2 diabetes mellitus with hyperglycemia: Secondary | ICD-10-CM | POA: Diagnosis not present

## 2020-07-19 DIAGNOSIS — I10 Essential (primary) hypertension: Secondary | ICD-10-CM | POA: Diagnosis not present

## 2020-08-16 ENCOUNTER — Other Ambulatory Visit: Payer: Self-pay | Admitting: Cardiology

## 2020-08-17 ENCOUNTER — Telehealth: Payer: Self-pay | Admitting: Cardiology

## 2020-08-17 MED ORDER — HYDROCHLOROTHIAZIDE 25 MG PO TABS: ORAL_TABLET | ORAL | 3 refills | Status: DC

## 2020-08-17 NOTE — Telephone Encounter (Signed)
Refill sent in per request.  

## 2020-08-17 NOTE — Telephone Encounter (Signed)
*  STAT* If patient is at the pharmacy, call can be transferred to refill team.   1. Which medications need to be refilled? (please list name of each medication and dose if known) hydrochlorothiazide (HYDRODIURIL) 25 MG tablet  2. Which pharmacy/location (including street and city if local pharmacy) is medication to be sent to? [facility] [phone] - [place], [address]  3. Do they need a 30 day or 90 day supply? 90 day supply

## 2020-09-21 DIAGNOSIS — H25811 Combined forms of age-related cataract, right eye: Secondary | ICD-10-CM | POA: Diagnosis not present

## 2020-10-01 ENCOUNTER — Other Ambulatory Visit: Payer: Self-pay | Admitting: Cardiology

## 2020-10-12 DIAGNOSIS — Z01818 Encounter for other preprocedural examination: Secondary | ICD-10-CM | POA: Diagnosis not present

## 2020-10-12 DIAGNOSIS — H25811 Combined forms of age-related cataract, right eye: Secondary | ICD-10-CM | POA: Diagnosis not present

## 2020-10-16 ENCOUNTER — Telehealth: Payer: Self-pay | Admitting: Cardiology

## 2020-10-16 MED ORDER — LOSARTAN POTASSIUM 25 MG PO TABS: 25.0000 mg | ORAL_TABLET | Freq: Every day | ORAL | 3 refills | Status: DC

## 2020-10-16 NOTE — Telephone Encounter (Signed)
Spoke to the patient just now and he let me know that he has been taking Losartan 25 daily that is his wife's. He is not having any symptoms and tells me that he feels great.   09/04: 144/83 (with medication)             161/94 (prior to medication) 09/06: 144/95 60 pulse (without medications this morning)  He would like to know Dr. Joya Gaskins recommendations.

## 2020-10-16 NOTE — Telephone Encounter (Signed)
Pt c/o BP issue: STAT if pt c/o blurred vision, one-sided weakness or slurred speech  1. What are your last 5 BP readings?  09/04: 144/83 (with medication)  161/94 (prior to medication) 09/06: 156/91 (with medication)   173/95 (prior to medication)  2. Are you having any other symptoms (ex. Dizziness, headache, blurred vision, passed out)?  No   3. What is your BP issue?   Patient's wife states the patient has been taking her Losartan the past 1-2 weeks because his BP has been elevated.

## 2020-10-16 NOTE — Addendum Note (Signed)
Addended by: Resa Miner I on: 10/16/2020 12:07 PM   Modules accepted: Orders

## 2020-10-16 NOTE — Telephone Encounter (Signed)
Spoke to the patient just now and let him know Dr. Joya Gaskins recommendations. He verbalizes understanding and thanks me for the call back.   Encouraged patient to call back with any questions or concerns.

## 2020-10-23 DIAGNOSIS — I10 Essential (primary) hypertension: Secondary | ICD-10-CM | POA: Diagnosis not present

## 2020-10-23 DIAGNOSIS — E1136 Type 2 diabetes mellitus with diabetic cataract: Secondary | ICD-10-CM | POA: Diagnosis not present

## 2020-10-23 DIAGNOSIS — H25811 Combined forms of age-related cataract, right eye: Secondary | ICD-10-CM | POA: Diagnosis not present

## 2020-10-23 DIAGNOSIS — Z7984 Long term (current) use of oral hypoglycemic drugs: Secondary | ICD-10-CM | POA: Diagnosis not present

## 2020-10-23 DIAGNOSIS — E119 Type 2 diabetes mellitus without complications: Secondary | ICD-10-CM | POA: Diagnosis not present

## 2020-10-23 DIAGNOSIS — H259 Unspecified age-related cataract: Secondary | ICD-10-CM | POA: Diagnosis not present

## 2020-10-30 ENCOUNTER — Telehealth: Payer: Self-pay | Admitting: Cardiology

## 2020-10-30 NOTE — Telephone Encounter (Signed)
Pt c/o BP issue: STAT if pt c/o blurred vision, one-sided weakness or slurred speech  1. What are your last 5 BP readings?  10/30/20   8:25 am: 176/99  10/28/20   7:53 pm: 166/98    7:50 pm: 165/102     9:22 am: 161/93 10/27/20 10:39 am: 129/69  2. Are you having any other symptoms (ex. Dizziness, headache, blurred vision, passed out)? No symptoms  3. What is your BP issue? Wife is concerned about fluctuation of BP. Wife said BP has started fluctuating after Labor Day. She is not sure that his current meds are working correctly.  She is also concerned that it may be her machine. Please advise

## 2020-10-30 NOTE — Telephone Encounter (Signed)
Spoke to the patients wife just now and she let me know that the patients blood pressure has been elevated lately. She did tell me that she has been taking it before he takes his medications in the morning. I advised her not to do this. I told her to take his blood pressure about an hour after he takes his medications and after he has rested for 10 minutes.   I advised her to take his blood pressure once daily for the next week and to either send [place] a message or drop off the readings in a week. I will route to Dr. Harriet Masson to see if she has any other recommendations in the mean time.    Encouraged patient to call back with any questions or concerns.

## 2020-11-14 NOTE — Progress Notes (Signed)
Cardiology Office Note:    Date:  11/15/2020   ID:  Christian Patton, DOB 08-22-1940, MRN [id number]  PCP:  Lowella Dandy, NP  Cardiologist:  Shirlee More, MD    Referring MD: Lowella Dandy, NP    ASSESSMENT:    1. Persistent atrial fibrillation (Christian Patton)   2. Chronic anticoagulation   3. Obstructive hypertrophic cardiomyopathy ([place])   4. Hypertensive heart disease with heart failure ([place])   5. Coronary artery disease of native artery of native heart with stable angina pectoris ([place])   6. Mixed hyperlipidemia    PLAN:    In order of problems listed above:  Christian Patton continues to do well he does not appear to be in atrial fibrillation today he declined an EKG we will continue his anticoagulant and beta-blocker.  Whether or not he is in atrial fibrillation he is asymptomatic. Stable obstructive hypertrophic cardiomyopathy asymptomatic no murmur on exam BP at target no evidence of fluid overload continue his current regimen including low-dose ARB and hydrochlorothiazide along with beta-blocker Stable CAD having no anginal discomfort continue medical treatment including his high intensity statin   Next appointment: 6 months   Medication Adjustments/Labs and Tests Ordered: Current medicines are reviewed at length with the patient today.  Concerns regarding medicines are outlined above.  Orders Placed This Encounter  Procedures   Basic metabolic panel    No orders of the defined types were placed in this encounter.   Follow-up for atrial fibrillation hypertension   History of Present Illness:    Christian Patton is a 80 y.o. male with a hx of  paroxysmal atrial fibrillation chronically anticoagulated obstructive hypertrophic cardiomyopathy coronary artery disease with previous PCI 2012 hypertensive heart disease with heart failure mixed hyperlipidemia  last seen 05/04/2020 when he was in atrial fibrillation and opted for rate control over cardioversion or EP pulmonary vein isolation.  His  echocardiogram 04/06/2019 showed EF 60 to 65% severe concentric LVH without LV outflow tract obstruction severe left atrial enlargement calcific mitral valve disease with mild to moderate mitral stenosis and mild aortic regurgitation.  Compliance with diet, lifestyle and medications: Yes   Christian Patton is seen back in the office by me he really is doing quite well he has no cardiovascular symptoms and his blood pressure at home is controlled since we have added a low-dose ARB. His pulse is regular today we talked about doing an EKG he does not want to be said he feels well whether or not he is in atrial fibrillation. Has had no edema shortness of breath chest pains palpitations or syncope.  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 34.9cm/sec               No mass, no ascites Labs- thrombocytopenia Overview:  Overview:  genotype 1a; Stage II fibrosis, Bx. 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   Hepatitis    C       "Has taken Harvoni"   History of other specified conditions presenting hazards to health Sep 27, 2014   Overview:  Overview:  perforated gastric ulcer   Hypertension    Hypertensive heart disease with heart failure ([place]) 09/27/14   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 07/03/2014   Obstructive hypertrophic cardiomyopathy ([place]) 09-27-2014   Palpitation 01/27/2018   Preoperative cardiovascular examination 12/29/2016   Primary osteoarthritis of left knee 06/23/2014   Pulmonary embolism without acute cor pulmonale ([place]) 09-27-2014   Sudden cardiac death ([place]) 09-27-14   Urine retention 07/12/2014    Past Surgical History:  Procedure Laterality Date   CARDIAC CATHETERIZATION     x 3    Last one was in 2011 @ [facility] Left 07/03/2014   Procedure: LEFT TOTAL KNEE ARTHOPLASTY;  Surgeon: Vickey Huger, MD;  Location: [facility];  Service: Orthopedics;  Laterality: Left;    Current Medications: Current Meds  Medication Sig   apixaban (ELIQUIS) 5 MG TABS tablet Take 1 tablet (5 mg total) by mouth 2 (two) times daily.   Ascorbic Acid (VITAMIN C) 1000 MG tablet Take 1,000 mg by mouth daily.   Calcium Carb-Cholecalciferol (CALCIUM-VITAMIN D) 500-200 MG-UNIT tablet Take 1 tablet by mouth daily.   glipiZIDE (GLUCOTROL XL) 5 MG 24 hr tablet Take 1 tablet by mouth as needed. Take only when sugar is above 120.   hydrochlorothiazide (HYDRODIURIL) 25 MG tablet TAKE ONE TABLET BY MOUTH DAILY FOR BLOOD PRESSURE OVER 140/80   losartan (COZAAR) 25 MG tablet Take 1 tablet (25 mg total) by mouth daily.   MAGNESIUM GLYCINATE PLUS PO Take 200 mg by mouth daily.   Melatonin 10 MG TABS Take 1 tablet by mouth at bedtime.   metoprolol succinate (TOPROL-XL) 25 MG 24 hr tablet Take 12.5 mg by  mouth daily.   multivitamin-iron-minerals-folic acid (THERAPEUTIC-M) TABS tablet Take 1 tablet by mouth daily.   Quercetin 250 MG TABS Take 500 mg by mouth daily.   QUEtiapine (SEROQUEL) 25 MG tablet Take 50 mg by mouth at bedtime.    rosuvastatin (CRESTOR) 5 MG tablet TAKE 1 TABLET DAILY   tadalafil (CIALIS) 5 MG tablet Take 1 tablet (5 mg total) by mouth daily as needed for erectile dysfunction.   Zinc 50 MG TABS Take by mouth daily.     Allergies:   Metformin and Methocarbamol   Social History   Socioeconomic History   Marital status: Married    Spouse name: Not on file   Number of children: Not on file   Years of education: Not on file   Highest education level: Not on file  Occupational History   Not on file  Tobacco Use   Smoking status: Former    Packs/day: 1.50  Years: 24.00    Pack years: 36.00    Types: Cigarettes   Smokeless tobacco: Former    Types: Nurse, children's Use: Never used  Substance and Sexual Activity   Alcohol use: No    Comment: quit 30 yrs ago   Drug use: No   Sexual activity: Not on file  Other Topics Concern   Not on file  Social History Narrative   Not on file   Social Determinants of Health   Financial Resource Strain: Not on file  Food Insecurity: Not on file  Transportation Needs: Not on file  Physical Activity: Not on file  Stress: Not on file  Social Connections: Not on file     Family History: The patient's family history includes Heart attack in his father and mother. ROS:   Please see the history of present illness.    All other systems reviewed and are negative.  EKGs/Labs/Other Studies Reviewed:    The following studies were reviewed today:    Recent Labs: 07/19/2020 cholesterol 163 LDL 95 triglycerides 122 HDL 46 A1c 6.9% hemoglobin 15.3 creatinine 1.04 potassium 4.5  Physical Exam:    VS:  BP 124/76 (BP Location: Right Arm, Patient Position: Sitting, Cuff Size: Normal)   Pulse 72   Ht _0   (1.626 m)   Wt 153 lb 3.2 oz (69.5 kg)   SpO2 97%   BMI 26.30 kg/m     Wt Readings from Last 3 Encounters:  11/15/20 153 lb 3.2 oz (69.5 kg)  05/04/20 154 lb (69.9 kg)  01/30/20 157 lb 12.8 oz (71.6 kg)     GEN: Appears his age well nourished, well developed in no acute distress HEENT: Normal NECK: No JVD; No carotid bruits LYMPHATICS: No lymphadenopathy CARDIAC: No murmur RRR, no murmurs, rubs, gallops RESPIRATORY:  Clear to auscultation without rales, wheezing or rhonchi  ABDOMEN: Soft, non-tender, non-distended MUSCULOSKELETAL:  No edema; No deformity  SKIN: Warm and dry NEUROLOGIC:  Alert and oriented x 3 PSYCHIATRIC:  Normal affect    Signed, Shirlee More, MD  11/15/2020 3:08 PM    [place]

## 2020-11-15 ENCOUNTER — Other Ambulatory Visit: Payer: Self-pay

## 2020-11-15 ENCOUNTER — Encounter: Payer: Self-pay | Admitting: Cardiology

## 2020-11-15 ENCOUNTER — Ambulatory Visit (INDEPENDENT_AMBULATORY_CARE_PROVIDER_SITE_OTHER): Payer: Medicare Other | Admitting: Cardiology

## 2020-11-15 VITALS — BP 124/76 | HR 72 | Ht 64.0 in | Wt 153.2 lb

## 2020-11-15 DIAGNOSIS — I110 Hypertensive heart disease with heart failure: Secondary | ICD-10-CM | POA: Diagnosis not present

## 2020-11-15 DIAGNOSIS — E782 Mixed hyperlipidemia: Secondary | ICD-10-CM

## 2020-11-15 DIAGNOSIS — I421 Obstructive hypertrophic cardiomyopathy: Secondary | ICD-10-CM

## 2020-11-15 DIAGNOSIS — Z7901 Long term (current) use of anticoagulants: Secondary | ICD-10-CM

## 2020-11-15 DIAGNOSIS — I4819 Other persistent atrial fibrillation: Secondary | ICD-10-CM | POA: Diagnosis not present

## 2020-11-15 DIAGNOSIS — I25118 Atherosclerotic heart disease of native coronary artery with other forms of angina pectoris: Secondary | ICD-10-CM

## 2020-11-15 NOTE — Patient Instructions (Signed)
Medication Instructions:  Your physician recommends that you continue on your current medications as directed. Please refer to the Current Medication list given to you today.  *If you need a refill on your cardiac medications before your next appointment, please call your pharmacy*   Lab Work: Your physician recommends that you return for lab work in: TODAY BMP If you have labs (blood work) drawn today and your tests are completely normal, you will receive your results only by: [facility] (if you have MyChart) OR A paper copy in the mail If you have any lab test that is abnormal or we need to change your treatment, we will call you to review the results.   Testing/Procedures: None   Follow-Up: At [facility], you and your health needs are our priority.  As part of our continuing mission to provide you with exceptional heart care, we have created designated Provider Care Teams.  These Care Teams include your primary Cardiologist (physician) and Advanced Practice Providers (APPs -  Physician Assistants and Nurse Practitioners) who all work together to provide you with the care you need, when you need it.  We recommend signing up for the patient portal called "MyChart".  Sign up information is provided on this After Visit Summary.  MyChart is used to connect with patients for Virtual Visits (Telemedicine).  Patients are able to view lab/test results, encounter notes, upcoming appointments, etc.  Non-urgent messages can be sent to your provider as well.   To learn more about what you can do with MyChart, go to [email or web address].    Your next appointment:   6 month(s)  The format for your next appointment:   In Person  Provider:   Shirlee More, MD   Other Instructions

## 2020-11-16 ENCOUNTER — Telehealth: Payer: Self-pay

## 2020-11-16 LAB — BASIC METABOLIC PANEL
BUN/Creatinine Ratio: 22 (ref 10–24)
BUN: 26 mg/dL (ref 8–27)
CO2: 24 mmol/L (ref 20–29)
Calcium: 10.3 mg/dL — ABNORMAL HIGH (ref 8.6–10.2)
Chloride: 100 mmol/L (ref 96–106)
Creatinine, Ser: 1.17 mg/dL (ref 0.76–1.27)
Glucose: 158 mg/dL — ABNORMAL HIGH (ref 70–99)
Potassium: 4.2 mmol/L (ref 3.5–5.2)
Sodium: 141 mmol/L (ref 134–144)
eGFR: 63 mL/min/{1.73_m2} (ref >59)

## 2020-11-16 NOTE — Telephone Encounter (Signed)
-----  Message from Richardo Priest, MD sent at 11/16/2020  8:10 AM EDT ----- Regarding: FW: Good result no changes in treatment ----- Message ----- From: Interface, [facility] Lab Results In Sent: 11/16/2020   5:38 AM EDT To: Richardo Priest, MD

## 2020-11-16 NOTE — Telephone Encounter (Signed)
Spoke with patient regarding results and recommendation.  Patient verbalizes understanding and is agreeable to plan of care. Advised patient to call back with any issues or concerns.

## 2020-11-22 DIAGNOSIS — I2510 Atherosclerotic heart disease of native coronary artery without angina pectoris: Secondary | ICD-10-CM | POA: Diagnosis not present

## 2020-11-22 DIAGNOSIS — E785 Hyperlipidemia, unspecified: Secondary | ICD-10-CM | POA: Diagnosis not present

## 2020-11-22 DIAGNOSIS — E1165 Type 2 diabetes mellitus with hyperglycemia: Secondary | ICD-10-CM | POA: Diagnosis not present

## 2020-11-22 DIAGNOSIS — I10 Essential (primary) hypertension: Secondary | ICD-10-CM | POA: Diagnosis not present

## 2020-11-22 DIAGNOSIS — F5104 Psychophysiologic insomnia: Secondary | ICD-10-CM | POA: Diagnosis not present

## 2020-11-22 DIAGNOSIS — I480 Paroxysmal atrial fibrillation: Secondary | ICD-10-CM | POA: Diagnosis not present

## 2020-11-22 DIAGNOSIS — Z6825 Body mass index (BMI) 25.0-25.9, adult: Secondary | ICD-10-CM | POA: Diagnosis not present

## 2020-11-22 DIAGNOSIS — Z86711 Personal history of pulmonary embolism: Secondary | ICD-10-CM | POA: Diagnosis not present

## 2020-12-20 DIAGNOSIS — E1165 Type 2 diabetes mellitus with hyperglycemia: Secondary | ICD-10-CM | POA: Diagnosis not present

## 2020-12-20 DIAGNOSIS — I10 Essential (primary) hypertension: Secondary | ICD-10-CM | POA: Diagnosis not present

## 2020-12-23 DIAGNOSIS — I4891 Unspecified atrial fibrillation: Secondary | ICD-10-CM | POA: Diagnosis not present

## 2021-01-17 DIAGNOSIS — E1165 Type 2 diabetes mellitus with hyperglycemia: Secondary | ICD-10-CM | POA: Diagnosis not present

## 2021-01-17 DIAGNOSIS — R059 Cough, unspecified: Secondary | ICD-10-CM | POA: Diagnosis not present

## 2021-01-17 DIAGNOSIS — I10 Essential (primary) hypertension: Secondary | ICD-10-CM | POA: Diagnosis not present

## 2021-01-24 DIAGNOSIS — R059 Cough, unspecified: Secondary | ICD-10-CM | POA: Diagnosis not present

## 2021-02-08 DIAGNOSIS — J849 Interstitial pulmonary disease, unspecified: Secondary | ICD-10-CM | POA: Diagnosis not present

## 2021-02-08 DIAGNOSIS — I358 Other nonrheumatic aortic valve disorders: Secondary | ICD-10-CM | POA: Diagnosis not present

## 2021-02-08 DIAGNOSIS — R918 Other nonspecific abnormal finding of lung field: Secondary | ICD-10-CM | POA: Diagnosis not present

## 2021-02-08 DIAGNOSIS — J432 Centrilobular emphysema: Secondary | ICD-10-CM | POA: Diagnosis not present

## 2021-02-08 DIAGNOSIS — I2510 Atherosclerotic heart disease of native coronary artery without angina pectoris: Secondary | ICD-10-CM | POA: Diagnosis not present

## 2021-02-11 ENCOUNTER — Other Ambulatory Visit: Payer: Self-pay | Admitting: Cardiology

## 2021-02-21 DIAGNOSIS — E1165 Type 2 diabetes mellitus with hyperglycemia: Secondary | ICD-10-CM | POA: Diagnosis not present

## 2021-02-21 DIAGNOSIS — I10 Essential (primary) hypertension: Secondary | ICD-10-CM | POA: Diagnosis not present

## 2021-03-05 ENCOUNTER — Ambulatory Visit (INDEPENDENT_AMBULATORY_CARE_PROVIDER_SITE_OTHER): Payer: Medicare Other | Admitting: Internal Medicine

## 2021-03-05 ENCOUNTER — Encounter: Payer: Self-pay | Admitting: Internal Medicine

## 2021-03-05 ENCOUNTER — Other Ambulatory Visit: Payer: Self-pay

## 2021-03-05 VITALS — BP 130/80 | HR 54 | Temp 97.5°F | Ht 64.0 in | Wt 152.2 lb

## 2021-03-05 DIAGNOSIS — J84112 Idiopathic pulmonary fibrosis: Secondary | ICD-10-CM | POA: Diagnosis not present

## 2021-03-05 DIAGNOSIS — J849 Interstitial pulmonary disease, unspecified: Secondary | ICD-10-CM | POA: Diagnosis not present

## 2021-03-05 LAB — BASIC METABOLIC PANEL
BUN: 20 mg/dL (ref 6–23)
CO2: 29 mEq/L (ref 19–32)
Calcium: 9.6 mg/dL (ref 8.4–10.5)
Chloride: 101 mEq/L (ref 96–112)
Creatinine, Ser: 1.04 mg/dL (ref 0.40–1.50)
GFR: 67.81 mL/min (ref >60.00)
Glucose, Bld: 116 mg/dL — ABNORMAL HIGH (ref 70–99)
Potassium: 3.8 mEq/L (ref 3.5–5.1)
Sodium: 138 mEq/L (ref 135–145)

## 2021-03-05 LAB — CBC WITH DIFFERENTIAL/PLATELET
Basophils Absolute: 0.1 10*3/uL (ref 0.0–0.1)
Basophils Relative: 0.6 % (ref 0.0–3.0)
Eosinophils Absolute: 0.3 10*3/uL (ref 0.0–0.7)
Eosinophils Relative: 3.3 % (ref 0.0–5.0)
HCT: 43.1 % (ref 39.0–52.0)
Hemoglobin: 14.5 g/dL (ref 13.0–17.0)
Lymphocytes Relative: 20.2 % (ref 12.0–46.0)
Lymphs Abs: 1.9 10*3/uL (ref 0.7–4.0)
MCHC: 33.5 g/dL (ref 30.0–36.0)
MCV: 87.6 fl (ref 78.0–100.0)
Monocytes Absolute: 0.9 10*3/uL (ref 0.1–1.0)
Monocytes Relative: 9.5 % (ref 3.0–12.0)
Neutro Abs: 6.2 10*3/uL (ref 1.4–7.7)
Neutrophils Relative %: 66.4 % (ref 43.0–77.0)
Platelets: 177 10*3/uL (ref 150.0–400.0)
RBC: 4.92 Mil/uL (ref 4.22–5.81)
RDW: 14.5 % (ref 11.5–15.5)
WBC: 9.3 10*3/uL (ref 4.0–10.5)

## 2021-03-05 LAB — HEPATIC FUNCTION PANEL
ALT: 18 U/L (ref 0–53)
AST: 22 U/L (ref 0–37)
Albumin: 4.5 g/dL (ref 3.5–5.2)
Alkaline Phosphatase: 74 U/L (ref 39–117)
Bilirubin, Direct: 0.1 mg/dL (ref 0.0–0.3)
Total Bilirubin: 0.5 mg/dL (ref 0.2–1.2)
Total Protein: 7.6 g/dL (ref 6.0–8.3)

## 2021-03-05 LAB — SEDIMENTATION RATE: Sed Rate: 13 mm/hr (ref 0–20)

## 2021-03-05 NOTE — Progress Notes (Signed)
OV 03/05/2021  Subjective:  Patient ID: Christian Patton, male , DOB: 06/09/1940 , age 81 y.o. , MRN: [id number] , ADDRESS: [place] [id number] PCP Lowella Dandy, NP Patient Care Team: Lowella Dandy, NP as PCP - General (Internal Medicine) Clydene Fake, MD (Urology)  This Provider for this visit: Treatment Team:  Attending Provider: Brand Males, MD    03/05/2021 -   Chief Complaint  Patient presents with   Consult    Pt had recent HRCT performed which is the reason for today's visit. Pt has had complaints of SOB and a cough about a month ago but currently denies any complaints.     HPI Christian Patton 81 y.o. -here at the referral of his primary care physician.  He almost felt well enough that he did not want to make this visit but came because his wife asking that he visit with [place].  I personally visualized the CT scan in 2013 done within the health system that showed some ILD changes.  However he is unaware of that.  He tells me that on an average he is extremely active he walks daily 1 mile.  He walks his dog.  He also chops wood.  Wife says he is always physically active.  He never feels any shortness of breath for this.  Then approximately 1 month ago he started having some cough and shortness of breath.  He felt the cough is different from her regular bronchitis.  It lasted 2 weeks and then spontaneously started getting better.  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  He tells me now he is back to his baseline he is able to walk a mile without a problem is able to walk his dog.  He is chopping words.  He is wondering why he has to be here   [facility] Comprehensive ILD Questionnaire  Symptoms:  Listed below. DYSpnea SCALE - ILD  03/05/2021  Current weight   O2 use ra  Shortness of Breath 0 -> 5 scale with 5 being worst (score 6 If unable to do)  At rest 0  Simple tasks - showers, clothes change, eating, shaving 0  Household (dishes, doing bed, laundry) 0  Shopping 0  Walking level at own pace 0  Walking up Stairs 1  Total (30-36) Dyspnea Score 1      Non-dyspnea symptoms (0-> 5 scale) 03/05/2021  How bad is your cough? 0  How bad is your fatigue 0  How bad is nausea 0  How bad is vomiting?  0  How bad is diarrhea? 0  How bad is anxiety? 0  How bad is depression 0  Any chronic pain - if so where and how bad Legs hurt when he walks     Past Medical History :  -He had hepatitis C long time back.  He was on a clinical trial with Harvoni.  Gave him a cure. -Is hard of hearing and wears hearing aid. -Has osteoarthritis -Has type 2 diabetes - Denies any cirrhosis, asthma COPD or connective tissue disease. - Denies any acid reflux.  Denies any stroke or MI or cancers   ROS:  -He does have chronic arthritis but denies its rheumatoid arthritis. -  He does not have any fatigue.  No snoring. - No acid reflux  FAMILY HISTORY of LUNG DISEASE:  -Denies any pulmonary fibrosis or COPD or asthma or cystic fibrosis.  Denies HP.  Denies autoimmune disease denies premature graying of the hair.  PERSONAL EXPOSURE HISTORY:  -Smoked heavily between 1960 1991 to 2 packs/day.  In the past he smokes cigars.  He is also smoked pipes.  Never did any marijuana.  Never did any cocaine no intravenous drug use.  HOME  EXPOSURE and HOBBY DETAILS :  [place]  The home itself is 81 years old.  No mold or mildew exposure.  No dampness in the living environment no nebulizer use.  No steam and use no Jacuzzi use.  No misting Fountain no pet birds.  No pet gerbils no feather pillows.  No mold in the [facility] duct.  No music habits -He does chop a lot of wood but there is no mold in it -He does  a lot of woodworking there is lot of sawdust with it. -He has dogs and cats.  OCCUPATIONAL HISTORY (122 questions) : -Organic antigen history exposure: Worked as a Scientist, clinical (histocompatibility and immunogenetics) and does gardening but otherwise negative -He worked in [employer] 40 years ago was exposed a lot of asbestos dust.  Currently chops a lot of wood there is no mold.  He does get exposed to a lot of sawdust for several years. 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   - mild emphyema +  - Coronary Artery calcificatin , Left artrial dilaton, Calcification of aortic and mitral valve      Simple office walk 185 feet x  3 laps goal with forehead probe 03/05/2021    O2 used ra   Number laps completed 3   Comments about pace avg   Resting Pulse Ox/HR 100% and 54/min   Final Pulse Ox/HR 99% and 75/min   Desaturated </= 88% no   Desaturated <= 3% points no   Got Tachycardic >/= 90/min no   Symptoms at end of test No complaints   Miscellaneous comments Normal walk test        has a past medical history of Acute renal failure ([place]) (07/12/2014), Arthralgia of both knees (06/15/2014), Arthritis, Atherosclerotic heart disease of native coronary artery without angina pectoris (2014/10/07), Chronic anticoagulation (2014-10-07), 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) ([place]) (10/07/2014), Coronary artery disease, Diabetes mellitus without complication ([place]), Essential hypertension (10-07-14), Exposure to hepatitis C (12/26/2016), GERD (gastroesophageal reflux disease), Hepatitis, History of other specified conditions presenting hazards to  health (10-07-14), Hypertension, Hypertensive heart disease with heart failure ([place]) (2014/10/07), 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   reports that he quit smoking about 30 years ago. His smoking use included cigarettes. He started smoking about 64 years ago. He has a 36.00 pack-year smoking history. He  has quit using smokeless tobacco.  His smokeless tobacco use included chew.  Past Surgical History:  Procedure Laterality Date   CARDIAC CATHETERIZATION     x 3    Last one was in 2011 @ [place] Left 07/03/2014   Procedure: LEFT TOTAL KNEE ARTHOPLASTY;  Surgeon: Vickey Huger, MD;  Location: [facility];  Service: Orthopedics;  Laterality: Left;    Allergies  Allergen Reactions   Metformin Diarrhea   Methocarbamol Other (See Comments)    Spouse states pt "acted crazy" after taking     Immunization History  Administered Date(s) Administered   Fluad Quad(high Dose 65+) 11/26/2018   Influenza-Unspecified 11/17/2016, 12/11/2017   Pneumococcal-Unspecified 12/12/2013    Family History  Problem Relation Age of Onset   Heart attack Mother    Heart attack Father      Current Outpatient Medications:    apixaban (ELIQUIS) 5 MG TABS tablet, Take 1 tablet (5 mg total) by mouth 2 (two) times daily., Disp: 180 tablet, Rfl: 3   Ascorbic Acid (VITAMIN C) 1000 MG tablet, Take 1,000 mg by mouth daily., Disp: , Rfl:    calcium gluconate 500 MG tablet, Take 1 tablet by mouth daily., Disp: , Rfl:    cholecalciferol (VITAMIN D3) 25 MCG (1000 UNIT) tablet, Take 1,000 Units by mouth daily., Disp: , Rfl:    hydrochlorothiazide  (HYDRODIURIL) 25 MG tablet, TAKE ONE TABLET BY MOUTH DAILY FOR BLOOD PRESSURE OVER 140/80, Disp: 90 tablet, Rfl: 3   MAGNESIUM GLYCINATE PLUS PO, Take 200 mg by mouth daily., Disp: , Rfl:    Melatonin 10 MG TABS, Take 1 tablet by mouth at bedtime., Disp: , Rfl:    metoprolol succinate (TOPROL-XL) 25 MG 24 hr tablet, Take 12.5 mg by mouth daily., Disp: , Rfl:    Multiple Vitamins-Minerals (MENS 50+ MULTIVITAMIN) TABS, Take 1 tablet by mouth daily., Disp: , Rfl:    Omega-3 Fatty Acids (FISH OIL) 600 MG CAPS, Take 1 capsule by mouth daily., Disp: , Rfl:    Quercetin 250 MG TABS, Take 500 mg by mouth daily., Disp: , Rfl:    QUEtiapine (SEROQUEL) 25 MG tablet, Take 50 mg by mouth at bedtime. , Disp: , Rfl:    rosuvastatin (CRESTOR) 5 MG tablet, TAKE 1 TABLET DAILY, Disp: 90 tablet, Rfl: 1   TRULICITY 0.25 KY/7.0WC SOPN, Inject into the skin., Disp: , Rfl:    Zinc 50 MG TABS, Take by mouth daily., Disp: , Rfl:       Objective:   Vitals:   03/05/21 1102  BP: 130/80  Pulse: (!) 54  Temp: (!) 97.5 F (36.4 C)  TempSrc: Oral  SpO2: 100%  Weight: 152 lb 3.2 oz (69 kg)  Height: _0  (1.626 m)    Estimated body mass index is 26.13 kg/m as calculated from the following:   Height as of this encounter: _1  (1.626 m).   Weight as of this encounter: 152 lb 3.2 oz (69 kg).  _2 @  [employer]   03/05/21 1102  Weight: 152 lb 3.2 oz (69 kg)     Physical Exam  General Appearance:    Alert, cooperative, no distress, appears stated age - think, fit , Deconditioned looking - no , OBESE  - no, Sitting on Wheelchair -  no  Head:    Normocephalic, without obvious abnormality, atraumatic  Eyes:    PERRL, conjunctiva/corneas clear,  Ears:    Normal  TM's and external ear canals, both ears. HEARING AID  Nose:   Nares normal, septum midline, mucosa normal, no drainage    or sinus tenderness. OXYGEN ON  - no . Patient is @ ra   Throat:   Lips, mucosa, and tongue normal; teeth and gums  normal. Cyanosis on lips - no  Neck:   Supple, symmetrical, trachea midline, no adenopathy;    thyroid:  no enlargement/tenderness/nodules; no carotid   bruit or JVD  Back:     Symmetric, no curvature, ROM normal, no CVA tenderness  Lungs:     Distress - no , Wheeze no, Barrell Chest - no, Purse lip breathing - no, Crackles - YES VELCRO base   Chest Wall:    No tenderness or deformity.    Heart:    Regular rate and rhythm, S1 and S2 normal, no rub   or gallop, Murmur - no  Breast Exam:    NOT DONE  Abdomen:     Soft, non-tender, bowel sounds active all four quadrants,    no masses, no organomegaly. Visceral obesity - no  Genitalia:   NOT DONE  Rectal:   NOT DONE  Extremities:   Extremities - normal, Has Cane - no, Clubbing - no, Edema - no  Pulses:   2+ and symmetric all extremities  Skin:   Stigmata of Connective Tissue Disease - no  Lymph nodes:   Cervical, supraclavicular, and axillary nodes normal  Psychiatric:  Neurologic:   Pleasant - yes, Anxious - no, Flat affect - no  CAm-ICU - neg, Alert and Oriented x 3 - yes, Moves all 4s - yes, Speech - normal, Cognition - intact           Assessment:       ICD-10-CM   1. ILD (interstitial lung disease) (HCC)  J84.9 Pulmonary function test    Sedimentation rate    ANA    Anti-DNA antibody, double-stranded    Rheumatoid factor    CK total and CKMB (cardiac)not at [facility]    Aldolase    Hypersensitivity Pneumonitis    QuantiFERON-TB Gold Plus    CBC with Differential/Platelet    Basic metabolic panel    Hepatic function panel    Hepatic function panel    Basic metabolic panel    CBC with Differential/Platelet    QuantiFERON-TB Gold Plus    Hypersensitivity Pneumonitis    Aldolase    CK total and CKMB (cardiac)not at [facility]    Rheumatoid factor    Anti-DNA antibody, double-stranded    ANA    Sedimentation rate    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  He still needs pulmonary function testing  We discussed the approved antifibrotic therapies of pirfenidone and nintedanib as below.  After hearing the potential for side effects and how well he feels relatively right now and him being fully functional he does not want to do the antifibrotic's.  I reiterated to him about the delay in progression.  He understood this but still did not want to do it.  He preferred to just have supportive care.   Both drugs OFEV and Esbriet only slow down progression, 1 out of 6 patients  - this means extension in quality of  life but no difference in symptoms  - no study directly compares the 2 drugs but efficacy roughly equal at 1 year time point   - OFEV  - - time to first exacerbation possibly reduced in one trial but not in another - twice daily, no titration, potentially more convenient dosing  - no need for sunscreen  - high chance of mild diarrhea but low chance of significant diarrhea needing to stop medication.   - Rx diarrhea with lomotil - slight increase in heart attack risk and theoretical increase in bleeding risk,   - need monthly blood work for 3 months and then every 6 months - monitor liver function   - ESBRIET  - 3 pill three times daily, slow titration.  - Need to wean sunscreen  - Some chance of nausea and anorexia with small chance for diarrhea  - no known heart attack risk - no known bleeding risk,   - need monthly blood work for 6 months - monitor liver function - possible mortality benefit in pooled analysis  - larger world wide experience   We then move the conversation to participating in a clinical trial which would be an advanced phase 3 study.  Specifically mentioned IV monoclonal antibody connected against connective tissue growth factor Pamrevulmab versus  placebo.  He immediately understood clinical trial because this is how he got a cure against hepatitis C.  He understands clinical trial has potential for therapeutic misconception.  He understands the primary purpose is for signs.  We discussed several points below.    1. Scientific Purpose  Clinical research is designed to produce generalizable knowledge and to answer questions about the safety and efficacy of intervention(s) under study in order to determine whether or not they may be useful for the care of future patients.  2. Study Procedures  Participation in a trial may involve procedures or tests, in addition to the intervention(s) under study, that are intended only or primarily to generate scientific knowledge and that are otherwise not necessary for patient care.   3. Uncertainty  For intervention(s) under study in clinical research, there often is less knowledge and more uncertainty about the risks and benefits to a population of trial participants than there is when a doctor offers a patient standard interventions.   4. Adherence to Protocol  Administration of the intervention(s) under study is typically based on a strict protocol with defined dose, scheduling, and use or avoidance of concurrent medications, compared to administration of standard interventions.  5. Clinician as Investigator  Clinicians who are in health care settings provide treatment; in a clinical trial setting, they are also investigating safety and efficacy of an intervention. In otherwise your doctor or nurse practitioner can be wearing 2 hats - one as care giver another as Company secretary  6. Patient as Visual merchandiser Subject  Patients participating in research trials are research subjects or volunteers. In other words participating in research is 100% voluntary and at one's own free weill. The decision to participate or not participate will NOT affect patient care and the doctor-patient  relationship in any way    After this he is decided to participate in clinical trial.  Given the consent form for Fibrogen 095 study.  He will review it.  He still needs to do pulmonary function testing standard of care and serology    Plan:     Patient Instructions     ICD-10-CM   1. 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        - I am concerned you  have Interstitial Lung Disease (ILD) otherwise called Pulmonary Fibrosis - Most likely this is a variety called IPF though there are  MANY varieties of this - I say IPF based on male > 65, UIP pattern, your exposures, prior smoking, progression and pattenr on CT - To narrow down possibilities and assess severity please do the following tests  Plan  - do full PFT here or in [place]  -  do autoimmune panel: Serum: ESR, ANA, DS-DNA, RF, Total CK,  Aldolase,  Hypersensitivity Pneumonitis Panel and Quantiferon gold, CBC, BMET and LFT  - respect your desire to not do nintedanib and pirfenidone the approved antiifbrotic treatments  - we discussed clinical trial as care option - glad you are interested baed on prior experience with Harvoni   - meet Sonia of [facility] and take Zephyrus 095 study consent    - visit [email or web address] for details  [place]ol[place]up  - next few to several weeks with DR Chase Caller to discus test restults   - can be video visit 15 min or 30 min face to face     SIGNATURE    Dr. Brand Males, M.D., F.C.C.P,  Pulmonary and Critical Care Medicine Staff Physician, [place] Director - Interstitial Lung Disease  Program  Pulmonary [place] at [place], [place], [address]  Pager: [phone], If no answer or between  15:00h - 7:00h: call [phone] Telephone: [phone]  6:01 PM 03/05/2021

## 2021-03-05 NOTE — Patient Instructions (Addendum)
ICD-10-CM   1. ILD (interstitial lung disease) ([place])  J84.9     2. UIP (usual interstitial pneumonitis) ([place])  J84.112        - I am concerned you  have Interstitial Lung Disease (ILD) otherwise called Pulmonary Fibrosis - Most likely this is a variety called IPF though there are  MANY varieties of this - I say IPF based on male > 65, UIP pattern, your exposures, prior smoking, progression and pattenr on CT - To narrow down possibilities and assess severity please do the following tests  Plan  - do full PFT here or in [place]  -  do autoimmune panel: Serum: ESR, ANA, DS-DNA, RF, Total CK,  Aldolase,  Hypersensitivity Pneumonitis Panel and Quantiferon gold, CBC, BMET and LFT  - respect your desire to not do nintedanib and pirfenidone the approved antiifbrotic treatments  - we discussed clinical trial as care option - glad you are interested baed on prior experience with Harvoni   - meet Sonia of [facility] and take Zephyrus 095 study consent    - visit [email or web address] for details  Folllowup  - next few to several weeks with DR Chase Caller to discus test restults   - can be video visit 15 min or 30 min face to face

## 2021-03-06 ENCOUNTER — Ambulatory Visit (INDEPENDENT_AMBULATORY_CARE_PROVIDER_SITE_OTHER): Payer: Medicare Other | Admitting: Internal Medicine

## 2021-03-06 DIAGNOSIS — J849 Interstitial pulmonary disease, unspecified: Secondary | ICD-10-CM | POA: Diagnosis not present

## 2021-03-06 LAB — PULMONARY FUNCTION TEST
DL/VA % pred: 62 %
DL/VA: 2.51 ml/min/mmHg/L
DLCO cor % pred: 59 %
DLCO cor: 11.98 ml/min/mmHg
DLCO unc % pred: 59 %
DLCO unc: 11.98 ml/min/mmHg
FEF 25-75 Post: 3.46 L/sec
FEF 25-75 Pre: 3 L/sec
FEF2575-%Change-Post: 15 %
FEF2575-%Pred-Post: 240 %
FEF2575-%Pred-Pre: 208 %
FEV1-%Change-Post: 2 %
FEV1-%Pred-Post: 133 %
FEV1-%Pred-Pre: 130 %
FEV1-Post: 2.85 L
FEV1-Pre: 2.78 L
FEV1FVC-%Change-Post: 2 %
FEV1FVC-%Pred-Pre: 113 %
FEV6-%Change-Post: 0 %
FEV6-%Pred-Post: 121 %
FEV6-%Pred-Pre: 121 %
FEV6-Post: 3.43 L
FEV6-Pre: 3.41 L
FEV6FVC-%Change-Post: 0 %
FEV6FVC-%Pred-Post: 108 %
FEV6FVC-%Pred-Pre: 108 %
FVC-%Change-Post: 0 %
FVC-%Pred-Post: 112 %
FVC-%Pred-Pre: 111 %
FVC-Post: 3.44 L
FVC-Pre: 3.42 L
Post FEV1/FVC ratio: 83 %
Post FEV6/FVC ratio: 100 %
Pre FEV1/FVC ratio: 81 %
Pre FEV6/FVC Ratio: 100 %
RV % pred: 69 %
RV: 1.61 L
TLC % pred: 87 %
TLC: 5.1 L

## 2021-03-06 LAB — ALDOLASE: Aldolase: 3.6 U/L (ref <=8.1)

## 2021-03-06 NOTE — Progress Notes (Signed)
PFT done today.

## 2021-03-07 LAB — ANA: Anti Nuclear Antibody (ANA): POSITIVE — AB

## 2021-03-07 LAB — ANTI-DNA ANTIBODY, DOUBLE-STRANDED: ds DNA Ab: 3 IU/mL

## 2021-03-07 LAB — CK TOTAL AND CKMB (NOT AT ARMC): Total CK: 97 U/L (ref 44–196)

## 2021-03-07 LAB — RHEUMATOID FACTOR: Rheumatoid fact SerPl-aCnc: <14 IU/mL (ref <14)

## 2021-03-07 LAB — ANTI-NUCLEAR AB-TITER (ANA TITER): ANA Titer 1: 1:640 {titer} — ABNORMAL HIGH

## 2021-03-08 LAB — HYPERSENSITIVITY PNEUMONITIS
A. Pullulans Abs: NEGATIVE
A.Fumigatus #1 Abs: NEGATIVE
Micropolyspora faeni, IgG: NEGATIVE
Pigeon Serum Abs: NEGATIVE
Thermoact. Saccharii: NEGATIVE
Thermoactinomyces vulgaris, IgG: NEGATIVE

## 2021-03-08 LAB — QUANTIFERON-TB GOLD PLUS
Mitogen-NIL: 1.15 IU/mL
NIL: 0.03 IU/mL
QuantiFERON-TB Gold Plus: NEGATIVE
TB1-NIL: 0 IU/mL
TB2-NIL: 0 IU/mL

## 2021-03-14 ENCOUNTER — Telehealth (INDEPENDENT_AMBULATORY_CARE_PROVIDER_SITE_OTHER): Payer: Medicare Other | Admitting: Internal Medicine

## 2021-03-14 ENCOUNTER — Telehealth: Payer: Self-pay | Admitting: Internal Medicine

## 2021-03-14 ENCOUNTER — Other Ambulatory Visit: Payer: Self-pay

## 2021-03-14 DIAGNOSIS — J84112 Idiopathic pulmonary fibrosis: Secondary | ICD-10-CM

## 2021-03-14 DIAGNOSIS — R768 Other specified abnormal immunological findings in serum: Secondary | ICD-10-CM

## 2021-03-14 DIAGNOSIS — J849 Interstitial pulmonary disease, unspecified: Secondary | ICD-10-CM

## 2021-03-14 NOTE — Progress Notes (Signed)
OV 03/05/2021  Subjective:  Patient ID: Christian Patton, male , DOB: 06/09/1940 , age 81 y.o. , MRN: [id number] , ADDRESS: [place] [id number] PCP Lowella Dandy, NP Patient Care Team: Lowella Dandy, NP as PCP - General (Internal Medicine) Clydene Fake, MD (Urology)  This Provider for this visit: Treatment Team:  Attending Provider: Brand Males, MD    03/05/2021 -   Chief Complaint  Patient presents with   Consult    Pt had recent HRCT performed which is the reason for today's visit. Pt has had complaints of SOB and a cough about a month ago but currently denies any complaints.     HPI Christian Patton 81 y.o. -here at the referral of his primary care physician.  He almost felt well enough that he did not want to make this visit but came because his wife asking that he visit with [place].  I personally visualized the CT scan in 2013 done within the health system that showed some ILD changes.  However he is unaware of that.  He tells me that on an average he is extremely active he walks daily 1 mile.  He walks his dog.  He also chops wood.  Wife says he is always physically active.  He never feels any shortness of breath for this.  Then approximately 1 month ago he started having some cough and shortness of breath.  He felt the cough is different from her regular bronchitis.  It lasted 2 weeks and then spontaneously started getting better.  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  He tells me now he is back to his baseline he is able to walk a mile without a problem is able to walk his dog.  He is chopping words.  He is wondering why he has to be here   [place] Comprehensive ILD Questionnaire  Symptoms:  Listed below. DYSpnea SCALE - ILD  03/05/2021  Current weight   O2 use ra  Shortness of Breath 0 -> 5 scale with 5 being worst (score 6 If unable to do)  At rest 0  Simple tasks - showers, clothes change, eating, shaving 0  Household (dishes, doing bed, laundry) 0  Shopping 0  Walking level at own pace 0  Walking up Stairs 1  Total (30-36) Dyspnea Score 1      Non-dyspnea symptoms (0-> 5 scale) 03/05/2021  How bad is your cough? 0  How bad is your fatigue 0  How bad is nausea 0  How bad is vomiting?  0  How bad is diarrhea? 0  How bad is anxiety? 0  How bad is depression 0  Any chronic pain - if so where and how bad Legs hurt when he walks     Past Medical History :  -He had hepatitis C long time back.  He was on a clinical trial with Harvoni.  Gave him a cure. -Is hard of hearing and wears hearing aid. -Has osteoarthritis -Has type 2 diabetes - Denies any cirrhosis, asthma COPD or connective tissue disease. - Denies any acid reflux.  Denies any stroke or MI or cancers   ROS:  -He does have chronic arthritis but denies its rheumatoid arthritis. -  He does not have any fatigue.  No snoring. - No acid reflux  FAMILY HISTORY of LUNG DISEASE:  -Denies any pulmonary fibrosis or COPD or asthma or cystic fibrosis.  Denies HP.  Denies autoimmune disease denies premature graying of the hair.  PERSONAL EXPOSURE HISTORY:  -Smoked heavily between 1960 1991 to 2 packs/day.  In the past he smokes cigars.  He is also smoked pipes.  Never did any marijuana.  Never did any cocaine no intravenous drug use.  HOME  EXPOSURE and HOBBY DETAILS :  [place]  The home itself is 81 years old.  No mold or mildew exposure.  No dampness in the living environment no nebulizer use.  No steam and use no Jacuzzi use.  No misting Fountain no pet birds.  No pet gerbils no feather pillows.  No mold in the [facility] duct.  No music habits -He does chop a lot of wood but there is no mold in it -He does  a lot of woodworking there is lot of sawdust with it. -He has dogs and cats.  OCCUPATIONAL HISTORY (122 questions) : -Organic antigen history exposure: Worked as a Scientist, clinical (histocompatibility and immunogenetics) and does gardening but otherwise negative -He worked in [employer] 40 years ago was exposed a lot of asbestos dust.  Currently chops a lot of wood there is no mold.  He does get exposed to a lot of sawdust for several years. 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   - mild emphyema +  - Coronary Artery calcificatin , Left artrial dilaton, Calcification of aortic and mitral valve      Simple office walk 185 feet x  3 laps goal with forehead probe 03/05/2021    O2 used ra   Number laps completed 3   Comments about pace avg   Resting Pulse Ox/HR 100% and 54/min   Final Pulse Ox/HR 99% and 75/min   Desaturated </= 88% no   Desaturated <= 3% points no   Got Tachycardic >/= 90/min no   Symptoms at end of test No complaints   Miscellaneous comments Normal walk test       OV 03/14/2021  Subjective:  Patient ID: Christian Patton, male , DOB: Apr 25, 1940 , age 81 y.o. , MRN: [id number] , ADDRESS: [place] [id number] PCP Lowella Dandy, NP Patient Care Team: Lowella Dandy, NP as PCP - General (Internal Medicine) Clydene Fake, MD (Urology)  This Provider for this visit: Treatment Team:  Attending Provider: Brand Males, MD  Type of visit: Video Circumstance: COVID-19 national emergency Identification of patient Christian Patton with Jan 06, 1941 and MRN [id number] - 2 person identifier Risks: Risks, benefits,  limitations of telephone visit explained. Patient understood and verbalized agreement to proceed Anyone else on call: wife Patient location: his home This provider location: [address]; [place]; [place] [address]. [facility]. [phone]    03/14/2021 -  cc: Video visit to discuss test results.  HPI Christian Patton 81 y.o. -he presents  with his wife.  About connected with them via video.  He is [id number] nine 5 infusion study.  His DLCO is modestly decreased to 59%.  His serology shows negative hypersensitivity pneumonitis panel.  However his ANA is positive at 1: 640.  He doees lower extremity joint pain while walking.  He is always put this down to osteoarthritis.  He does not know if he has rheumatoid arthritis.  He does not know if he has any connective tissue disease.  His rheumatoid factor and CCP are negative.  I discussed the need for rheumatology visit to ensure there is no connective tissue disease causing his ILD.  He and his wife are open to this idea.  They are still interested only in clinical trials.  He does not qualify for the infusion study.  However there is an inhaler study with treprostinil versus placebo.  I emailed him the consent during this office visit but I did inform him that we would want to wait till the rheumatologist evaluated him and also discuss in in our multidisciplinary case conference.  He is fine with this approach. PFT  PFT Results Latest Ref Rng & Units 03/06/2021  FVC-Pre L 3.42  FVC-Predicted Pre % 111  FVC-Post L 3.44  FVC-Predicted Post % 112  Pre FEV1/FVC % % 81  Post FEV1/FCV % % 83  FEV1-Pre L 2.78  FEV1-Predicted Pre % 130  FEV1-Post L 2.85  DLCO uncorrected ml/min/mmHg 11.98  DLCO UNC% % 59  DLCO corrected ml/min/mmHg 11.98  DLCO COR %Predicted % 59  DLVA Predicted % 62  TLC L 5.10  TLC % Predicted % 87  RV % Predicted % 69    AUTOIMMUNE   Latest Reference Range & Units 03/05/21 12:25  Anti Nuclear Antibody  (ANA) NEGATIVE  POSITIVE !  ANA Pattern 1  Cytoplasmic, Rods and Rings !  ANA Titer 1 titer 1:640 (H)  ds DNA Ab IU/mL 3  RA Latex Turbid. <14 IU/mL <14  !: Data is abnormal (H): Data is abnormally high  Latest Reference Range & Units 03/05/21 12:25  A.Fumigatus #1 Abs Negative  Negative  Micropolyspora faeni, IgG Negative  Negative  Thermoactinomyces vulgaris, IgG Negative  Negative  A. Pullulans Abs Negative  Negative  Thermoact. 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) ([place]) (October 02, 2014), Coronary artery disease, Diabetes mellitus without complication ([place]), Essential hypertension (10-02-14), Exposure to hepatitis C (12/26/2016), GERD (gastroesophageal reflux disease), Hepatitis, History of other specified conditions presenting hazards to health (02-Oct-2014), Hypertension, Hypertensive heart disease with heart failure ([place]) (2014/10/02), 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   reports that he quit smoking about 30 years ago. His smoking use included cigarettes. He started smoking about 64 years ago. He has a 36.00 pack-year smoking history. He has quit using smokeless tobacco.  His smokeless tobacco use included chew.  Past Surgical History:  Procedure Laterality Date   CARDIAC CATHETERIZATION     x 3    Last one was in 2011 @ [facility]  TONSILLECTOMY     TOTAL KNEE ARTHROPLASTY Left 07/03/2014   Procedure: LEFT TOTAL KNEE ARTHOPLASTY;  Surgeon: Vickey Huger, MD;  Location: [facility];  Service: Orthopedics;  Laterality: Left;    Allergies  Allergen Reactions   Metformin Diarrhea   Methocarbamol Other (See Comments)    Spouse states pt "acted crazy" after taking     Immunization History  Administered Date(s) Administered   Fluad Quad(high Dose 65+) 11/26/2018   Influenza-Unspecified 11/17/2016, 12/11/2017   Pneumococcal-Unspecified 12/12/2013    Family History  Problem Relation Age of Onset   Heart attack Mother    Heart attack Father      Current Outpatient Medications:    apixaban (ELIQUIS) 5 MG TABS tablet, Take 1 tablet (5 mg total) by mouth 2 (two) times daily., Disp: 180 tablet, Rfl: 3   Ascorbic Acid (VITAMIN C) 1000 MG tablet, Take 1,000 mg by mouth daily., Disp: , Rfl:    calcium gluconate 500 MG tablet, Take 1 tablet by mouth daily., Disp: , Rfl:    cholecalciferol (VITAMIN D3) 25 MCG (1000 UNIT) tablet, Take 1,000 Units by mouth daily., Disp: , Rfl:    hydrochlorothiazide (HYDRODIURIL) 25 MG tablet, TAKE ONE TABLET BY MOUTH DAILY FOR BLOOD PRESSURE OVER 140/80, Disp: 90 tablet, Rfl: 3   MAGNESIUM GLYCINATE PLUS PO, Take 200 mg by mouth daily., Disp: , Rfl:    Melatonin 10 MG TABS, Take 1 tablet by mouth at bedtime., Disp: , Rfl:    metoprolol succinate (TOPROL-XL) 25 MG 24 hr tablet, Take 12.5 mg by mouth daily., Disp: , Rfl:    Multiple Vitamins-Minerals (MENS 50+ MULTIVITAMIN) TABS, Take 1 tablet by mouth daily., Disp: , Rfl:    Omega-3 Fatty Acids (FISH OIL) 600 MG CAPS, Take 1 capsule by mouth daily., Disp: , Rfl:    Quercetin 250 MG TABS, Take 500 mg by mouth daily., Disp: , Rfl:    QUEtiapine (SEROQUEL) 25 MG tablet, Take 50 mg by mouth at bedtime. , Disp: , Rfl:    rosuvastatin (CRESTOR) 5 MG tablet, TAKE 1 TABLET DAILY, Disp: 90 tablet, Rfl: 1   TRULICITY 5.88 FO/2.7XA SOPN, Inject into  the skin., Disp: , Rfl:    Zinc 50 MG TABS, Take by mouth daily., Disp: , Rfl:       Objective:   There were no vitals filed for this visit.  Estimated body mass index is 26.13 kg/m as calculated from the following:   Height as of 03/05/21: _0  (1.626 m).   Weight as of 03/05/21: 152 lb 3.2 oz (69 kg).  _1 @  There were no vitals filed for this visit.   Physical Exam Looks well.  He was sitting in his desk.  Plenty of books behind him.  He likes to read.      Assessment:       ICD-10-CM   1. ILD (interstitial lung disease) (HCC)  J84.9     2. 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 ANA positive  R76.8          Plan:     Patient Instructions     ICD-10-CM   1. ILD (interstitial lung disease) (HCC)  J84.9     2. 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 ANA positive  R76.8      - I say likely  IPF based on male > 65, UIP pattern, your exposures, prior smoking, progression and pattenr on CT.  We will get more confident aabout diagnosis after seeing rheumatology (ANA positivity) and discussing in case conferec  - severity: right now mild based on PFT  Plan - refer rheum - first available  - discuss in MDD conference march 2022 - respect your desire to not do nintedanib and pirfenidone the approved antiifbrotic treatments - we appreciate interest in clinical trial as care option    - does not qualify for Zephyrus 095 study consent because lungs are "above cut off"   - weill email TETON study consent   - hold off study strt till rheum opinion complete and discussion in MDD conference  [place]ol[place]up - April 2023 for 15 min visit   (Level 04: Estb 30-39 min   visit type: video virtual visit visit spent in total care time and counseling or/and coordination of care by this undersigned MD - Dr Brand Males. This includes one or more of the following on this same day 03/14/2021: pre-charting, chart review, note writing,  documentation discussion of test results, diagnostic or treatment recommendations, prognosis, risks and benefits of management options, instructions, education, compliance or risk-factor reduction. It excludes time spent by the [place] or office staff in the care of the patient . Actual time is 32 min)   SIGNATURE    Dr. Brand Males, M.D., F.C.C.P,  Pulmonary and Critical Care Medicine Staff Physician, [place] Director - Interstitial Lung Disease  Program  [facility] [place] [facility] [place], [place], [address]  Pager: [phone], If no answer or between  15:00h - 7:00h: call [phone] Telephone: [phone]  4:01 PM 03/14/2021

## 2021-03-14 NOTE — Telephone Encounter (Signed)
Pt had video visit today 03/14/21 with MR. Pt was told to f/u with MR 05/2021.   Attempted to call pt to get that appt scheduled but unable to reach. Left message for pt to return call.   When pt returns call, please make the f/u appt.

## 2021-03-14 NOTE — Patient Instructions (Addendum)
ICD-10-CM   1. ILD (interstitial lung disease) (HCC)  J84.9     2. 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 ANA positive  R76.8      - I say likely  IPF based on male > 65, UIP pattern, your exposures, prior smoking, progression and pattenr on CT. We will get more confident aabout diagnosis after seeing rheumatology (ANA positivity) and discussing in case conferec  - severity: right now mild based on PFT  Plan - refer rheum - first available  - discuss in MDD conference march 2022 - respect your desire to not do nintedanib and pirfenidone the approved antiifbrotic treatments - we appreciate interest in clinical trial as care option    - does not qualify for Zephyrus 095 study consent because lungs are "above cut off"   - weill email TETON study consent   - hold off study strt till rheum opinion complete and discussion in MDD conference  Folllowup - April 2023 for 15 min visit

## 2021-03-14 NOTE — Addendum Note (Signed)
Addended by: Lorretta Harp on: 03/14/2021 04:08 PM   Modules accepted: Orders

## 2021-03-15 DIAGNOSIS — F5104 Psychophysiologic insomnia: Secondary | ICD-10-CM | POA: Diagnosis not present

## 2021-03-15 DIAGNOSIS — J849 Interstitial pulmonary disease, unspecified: Secondary | ICD-10-CM | POA: Diagnosis not present

## 2021-03-15 DIAGNOSIS — I2510 Atherosclerotic heart disease of native coronary artery without angina pectoris: Secondary | ICD-10-CM | POA: Diagnosis not present

## 2021-03-15 DIAGNOSIS — I10 Essential (primary) hypertension: Secondary | ICD-10-CM | POA: Diagnosis not present

## 2021-03-15 DIAGNOSIS — I480 Paroxysmal atrial fibrillation: Secondary | ICD-10-CM | POA: Diagnosis not present

## 2021-03-15 DIAGNOSIS — E785 Hyperlipidemia, unspecified: Secondary | ICD-10-CM | POA: Diagnosis not present

## 2021-03-15 DIAGNOSIS — E1165 Type 2 diabetes mellitus with hyperglycemia: Secondary | ICD-10-CM | POA: Diagnosis not present

## 2021-03-18 ENCOUNTER — Other Ambulatory Visit: Payer: Self-pay | Admitting: Cardiology

## 2021-03-28 NOTE — Telephone Encounter (Signed)
FYI: Spoke with patient regarding April follow up and he states he only had a cold and is absolutely fine. Patient did not want to schedule and proceeded to say bye and hung up. Nothing further needed.

## 2021-04-01 DIAGNOSIS — J019 Acute sinusitis, unspecified: Secondary | ICD-10-CM | POA: Diagnosis not present

## 2021-04-12 ENCOUNTER — Other Ambulatory Visit: Payer: Self-pay

## 2021-04-12 MED ORDER — APIXABAN 5 MG PO TABS: 5.0000 mg | ORAL_TABLET | Freq: Two times a day (BID) | ORAL | 1 refills | Status: DC

## 2021-04-12 NOTE — Telephone Encounter (Signed)
Prescription refill request for Eliquis received. ?Indication:PE ?Last office visit:10/22 ?Scr:1.0 ?Age: 81 ?Weight:69 kg ? ?Prescription refilled ? ?

## 2021-06-14 DIAGNOSIS — F5104 Psychophysiologic insomnia: Secondary | ICD-10-CM | POA: Diagnosis not present

## 2021-06-14 DIAGNOSIS — I2510 Atherosclerotic heart disease of native coronary artery without angina pectoris: Secondary | ICD-10-CM | POA: Diagnosis not present

## 2021-06-14 DIAGNOSIS — E1165 Type 2 diabetes mellitus with hyperglycemia: Secondary | ICD-10-CM | POA: Diagnosis not present

## 2021-06-14 DIAGNOSIS — I480 Paroxysmal atrial fibrillation: Secondary | ICD-10-CM | POA: Diagnosis not present

## 2021-06-14 DIAGNOSIS — I10 Essential (primary) hypertension: Secondary | ICD-10-CM | POA: Diagnosis not present

## 2021-06-14 DIAGNOSIS — J849 Interstitial pulmonary disease, unspecified: Secondary | ICD-10-CM | POA: Diagnosis not present

## 2021-06-14 DIAGNOSIS — E785 Hyperlipidemia, unspecified: Secondary | ICD-10-CM | POA: Diagnosis not present

## 2021-08-01 ENCOUNTER — Ambulatory Visit (INDEPENDENT_AMBULATORY_CARE_PROVIDER_SITE_OTHER): Payer: Medicare Other | Admitting: Cardiology

## 2021-08-01 ENCOUNTER — Encounter: Payer: Self-pay | Admitting: Cardiology

## 2021-08-01 VITALS — BP 158/82 | HR 55 | Ht 64.0 in | Wt 150.8 lb

## 2021-08-01 DIAGNOSIS — E782 Mixed hyperlipidemia: Secondary | ICD-10-CM | POA: Diagnosis not present

## 2021-08-01 DIAGNOSIS — I4819 Other persistent atrial fibrillation: Secondary | ICD-10-CM

## 2021-08-01 DIAGNOSIS — Z7901 Long term (current) use of anticoagulants: Secondary | ICD-10-CM | POA: Diagnosis not present

## 2021-08-01 DIAGNOSIS — I25118 Atherosclerotic heart disease of native coronary artery with other forms of angina pectoris: Secondary | ICD-10-CM

## 2021-08-01 DIAGNOSIS — I421 Obstructive hypertrophic cardiomyopathy: Secondary | ICD-10-CM

## 2021-08-01 DIAGNOSIS — I110 Hypertensive heart disease with heart failure: Secondary | ICD-10-CM | POA: Diagnosis not present

## 2021-08-01 NOTE — Patient Instructions (Signed)
Medication Instructions:  Your physician recommends that you continue on your current medications as directed. Please refer to the Current Medication list given to you today.  *If you need a refill on your cardiac medications before your next appointment, please call your pharmacy*   Lab Work: NONE If you have labs (blood work) drawn today and your tests are completely normal, you will receive your results only by: [place] (if you have MyChart) OR A paper copy in the mail If you have any lab test that is abnormal or we need to change your treatment, we will call you to review the results.   Testing/Procedures: NONE   Follow-Up: At [facility], you and your health needs are our priority.  As part of our continuing mission to provide you with exceptional heart care, we have created designated Provider Care Teams.  These Care Teams include your primary Cardiologist (physician) and Advanced Practice Providers (APPs -  Physician Assistants and Nurse Practitioners) who all work together to provide you with the care you need, when you need it.  We recommend signing up for the patient portal called "MyChart".  Sign up information is provided on this After Visit Summary.  MyChart is used to connect with patients for Virtual Visits (Telemedicine).  Patients are able to view lab/test results, encounter notes, upcoming appointments, etc.  Non-urgent messages can be sent to your provider as well.   To learn more about what you can do with MyChart, go to [email or web address].    Your next appointment:   9 month(s)  The format for your next appointment:   In Person  Provider:   Shirlee More, MD    Other Instructions   Important Information About Sugar

## 2021-08-01 NOTE — Progress Notes (Signed)
Cardiology Office Note:    Date:  08/01/2021   ID:  Christian Patton, DOB 09-01-1940, MRN [id number]  PCP:  Lowella Dandy, NP  Cardiologist:  Shirlee More, MD    Referring MD: Lowella Dandy, NP    ASSESSMENT:    1. Persistent atrial fibrillation ([place])   2. Chronic anticoagulation   3. Obstructive hypertrophic cardiomyopathy ([place])   4. Hypertensive heart disease with heart failure ([place])   5. Coronary artery disease of native artery of native heart with stable angina pectoris ([place])   6. Mixed hyperlipidemia    PLAN:    In order of problems listed above:  Christian Patton has done well rate is controlled low-dose beta-blocker remains anticoagulated without bleeding complication continue both beta-blocker and his current anticoagulant Stable for his obstructive hypertrophic cardiomyopathy Blood pressure well controlled no evidence of heart failure no longer on a loop diuretic Stable CAD New York Heart Association class I having no anginal discomfort continue treatment including his beta-blocker and statin lipids are at target   Next appointment: 9 months   Medication Adjustments/Labs and Tests Ordered: Current medicines are reviewed at length with the patient today.  Concerns regarding medicines are outlined above.  Orders Placed This Encounter  Procedures   EKG 12-Lead   No orders of the defined types were placed in this encounter.   Chief Complaint  Patient presents with   Follow-up   Cardiomyopathy    History of Present Illness:    Christian Patton is a 81 y.o. 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 His echocardiogram 04/06/2019 showed EF 60 to 65% severe concentric LVH without LV outflow tract obstruction severe left atrial  enlargement calcific mitral valve disease with mild to moderate mitral stenosis and mild aortic regurgitation.   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 Some blood pressure runs consistently 120 and 130/70. He has no angina edema shortness of breath syncope or palpitation. He no longer requires a loop diuretic He tolerates his statin without muscle pain or weakness His diabetes is well controlled A1c 6.2% Most recent lipid profile 06/14/2021 cholesterol 125 LDL 60 Past Medical History:  Diagnosis Date   Acute renal failure ([place]) 07/12/2014   Arthralgia of both knees 06/15/2014   Arthritis    Atherosclerotic heart disease of native coronary artery without angina pectoris 09/13/2014   Overview:  Stented x 3; 2012;  cardiac cath clean arteries, 2014 Overview:  Overview:  Stented x 3; 2012;  cardiac cath clean arteries, 2014   Chronic anticoagulation 09/13/2014   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 34.9cm/sec               No mass, no ascites Labs- thrombocytopenia Overview:  Overview:  genotype 1a; Stage II fibrosis, Bx. 2005; Tx'd with Harvoni x 24 weeks, 2015   Coronary artery disease    Diabetes mellitus without complication ([place])    TYPE 2   Essential hypertension 09/13/2014   Overview:  on meds Overview:  Overview:  on meds   Exposure to hepatitis C 12/26/2016   Overview:  Diagnosed 1995 Genotype 1a Liver Bx- Stage II fibrosis, 2005 Treated with IFN + RBV x 24 weeks, no response, 2002  continued additional 2 years-2005 Re-treated with Harvoni, 24 weeks, 2015   Sustained viral eradication  Risks-blood transfusion, UGI bleed, perforated duodenal ulcer, 1966   GERD  (gastroesophageal reflux disease)    d/t barretts esphagus   Hepatitis    C       "Has taken Harvoni"   History of other specified conditions presenting hazards to health 09/17/2014   Overview:  Overview:  perforated gastric ulcer   Hypertension    Hypertensive heart disease with heart failure ([place]) 09-17-14   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 07/03/2014   Obstructive hypertrophic cardiomyopathy ([place]) 09-17-14   Palpitation 01/27/2018   Preoperative cardiovascular examination 12/29/2016   Primary osteoarthritis of left knee 06/23/2014   Pulmonary embolism without acute cor pulmonale ([place]) 09-17-14   Sudden cardiac death ([place]) 17-Sep-2014   Urine retention 07/12/2014    Past Surgical History:  Procedure Laterality Date   CARDIAC CATHETERIZATION     x 3    Last one was in 2011 @ [facility] Left 07/03/2014   Procedure: LEFT TOTAL KNEE ARTHOPLASTY;  Surgeon: Vickey Huger, MD;  Location: [facility];  Service: Orthopedics;  Laterality: Left;    Current Medications: Current Meds  Medication Sig   apixaban (ELIQUIS) 5 MG TABS tablet Take 1 tablet (5 mg total) by mouth 2 (two) times daily.   Ascorbic Acid (VITAMIN C) 1000 MG tablet Take 1,000 mg by mouth daily.   calcium gluconate 500 MG tablet Take 1 tablet by mouth daily.   cholecalciferol (VITAMIN D3) 25 MCG (1000 UNIT) tablet Take 1,000 Units by mouth daily.   hydrochlorothiazide (HYDRODIURIL) 25 MG tablet TAKE ONE TABLET BY MOUTH DAILY FOR BLOOD PRESSURE OVER 140/80   MAGNESIUM GLYCINATE PLUS PO Take 200 mg by mouth daily.   Melatonin 10 MG TABS Take 1 tablet by mouth at bedtime.   metoprolol succinate (TOPROL-XL) 25 MG 24 hr tablet Take 12.5 mg by mouth daily.   Multiple Vitamins-Minerals (MENS 50+ MULTIVITAMIN) TABS Take 1 tablet by mouth daily.   Omega-3 Fatty Acids (FISH OIL) 600 MG CAPS Take 1 capsule by mouth daily.   Quercetin 250 MG TABS Take 500 mg by mouth daily.   QUEtiapine  (SEROQUEL) 25 MG tablet Take 50 mg by mouth at bedtime.    rosuvastatin (CRESTOR) 5 MG tablet Take 1 tablet (5 mg total) by mouth daily.   TRULICITY 8.09 XI/3.3AS SOPN Inject into the skin.   Zinc 50 MG TABS Take by mouth daily.     Allergies:   Metformin and Methocarbamol   Social History   Socioeconomic History   Marital status: Married    Spouse name: Not on file   Number of children: Not on file   Years of education: Not on file   Highest education level: Not on file  Occupational History   Not on file  Tobacco Use   Smoking status: Former    Packs/day: 1.50    Years: 24.00    Total pack years: 36.00    Types: Cigarettes    Start date: 22    Quit date: 1993    Years since quitting: 30.4    Passive exposure: Past   Smokeless tobacco:  Former    Types: Nurse, children's Use: Never used  Substance and Sexual Activity   Alcohol use: No    Comment: quit 30 yrs ago   Drug use: No   Sexual activity: Not on file  Other Topics Concern   Not on file  Social History Narrative   Not on file   Social Determinants of Health   Financial Resource Strain: Not on file  Food Insecurity: Not on file  Transportation Needs: Not on file  Physical Activity: Not on file  Stress: Not on file  Social Connections: Not on file     Family History: The patient's family history includes Heart attack in his father and mother. ROS:   Please see the history of present illness.    All other systems reviewed and are negative.  EKGs/Labs/Other Studies Reviewed:    The following studies were reviewed today:  EKG:  EKG ordered today and personally reviewed.  The ekg ordered today demonstrates atrial fibrillation controlled ventricular rate left ventricular hypertrophy and repolarization changes    Physical Exam:    VS:  BP (!) 158/82 (BP Location: Left Arm, Patient Position: Sitting)   Pulse (!) 55   Ht _0  (1.626 m)   Wt 150 lb 12.8 oz (68.4 kg)   SpO2 97%   BMI 25.88  kg/m     Wt Readings from Last 3 Encounters:  08/01/21 150 lb 12.8 oz (68.4 kg)  03/05/21 152 lb 3.2 oz (69 kg)  11/15/20 153 lb 3.2 oz (69.5 kg)     GEN:  Well nourished, well developed in no acute distress HEENT: Normal NECK: No JVD; No carotid bruits LYMPHATICS: No lymphadenopathy CARDIAC: Irregular rhythm controlled rate RRR, no murmurs, rubs, gallops RESPIRATORY:  Clear to auscultation without rales, wheezing or rhonchi  ABDOMEN: Soft, non-tender, non-distended MUSCULOSKELETAL:  No edema; No deformity  SKIN: Warm and dry NEUROLOGIC:  Alert and oriented x 3 PSYCHIATRIC:  Normal affect    Signed, Shirlee More, MD  08/01/2021 4:55 PM    [facility]

## 2021-08-20 ENCOUNTER — Telehealth: Payer: Self-pay | Admitting: Cardiology

## 2021-08-20 ENCOUNTER — Telehealth: Payer: Self-pay | Admitting: *Deleted

## 2021-08-20 MED ORDER — HYDROCHLOROTHIAZIDE 25 MG PO TABS: ORAL_TABLET | ORAL | 3 refills | Status: DC

## 2021-08-20 NOTE — Addendum Note (Signed)
Addended by: Darrel Reach on: 08/20/2021 04:47 PM   Modules accepted: Orders

## 2021-08-20 NOTE — Telephone Encounter (Signed)
Error  

## 2021-08-20 NOTE — Telephone Encounter (Signed)
*  STAT* If patient is at the pharmacy, call can be transferred to refill team.   1. Which medications need to be refilled? (please list name of each medication and dose if known) hydrochlorothiazide (HYDRODIURIL) 25 MG tablet  2. Which pharmacy/location (including street and city if local pharmacy) is medication to be sent to? [facility] [phone] - [place], [address] AT [address]  3. Do they need a 30 day or 90 day supply? 90   Pt is completely out of medication.

## 2021-08-30 DIAGNOSIS — E785 Hyperlipidemia, unspecified: Secondary | ICD-10-CM | POA: Diagnosis not present

## 2021-08-30 DIAGNOSIS — Z1331 Encounter for screening for depression: Secondary | ICD-10-CM | POA: Diagnosis not present

## 2021-08-30 DIAGNOSIS — Z9181 History of falling: Secondary | ICD-10-CM | POA: Diagnosis not present

## 2021-08-30 DIAGNOSIS — Z0000 Encounter for general adult medical examination without abnormal findings: Secondary | ICD-10-CM | POA: Diagnosis not present

## 2021-08-30 DIAGNOSIS — Z139 Encounter for screening, unspecified: Secondary | ICD-10-CM | POA: Diagnosis not present

## 2021-09-04 DIAGNOSIS — H35372 Puckering of macula, left eye: Secondary | ICD-10-CM | POA: Diagnosis not present

## 2021-09-23 ENCOUNTER — Other Ambulatory Visit: Payer: Self-pay | Admitting: Cardiology

## 2021-09-29 ENCOUNTER — Telehealth: Payer: Self-pay | Admitting: Internal Medicine

## 2021-09-29 NOTE — Telephone Encounter (Signed)
Christian Patton last visit feb 2023. Unclear if he wants to follow with me or not. Video visit fine or face to face first avail. Ideall after PFT spiro dlco

## 2021-10-03 DIAGNOSIS — H35372 Puckering of macula, left eye: Secondary | ICD-10-CM | POA: Diagnosis not present

## 2021-10-03 DIAGNOSIS — H34232 Retinal artery branch occlusion, left eye: Secondary | ICD-10-CM | POA: Diagnosis not present

## 2021-10-06 ENCOUNTER — Other Ambulatory Visit: Payer: Self-pay | Admitting: Cardiology

## 2021-10-07 NOTE — Telephone Encounter (Signed)
Prescription refill request for Eliquis received. Indication: Atrial Fib Last office visit: 08/01/21  Rinaldo Cloud MD Scr: 1.04 on 03/05/21 Age: 81 Weight: 68.4kg  Based on above findings Eliquis 26m twice daily is the appropriate dose.  Refill approved.

## 2021-10-10 DIAGNOSIS — F5104 Psychophysiologic insomnia: Secondary | ICD-10-CM | POA: Diagnosis not present

## 2021-10-10 DIAGNOSIS — J849 Interstitial pulmonary disease, unspecified: Secondary | ICD-10-CM | POA: Diagnosis not present

## 2021-10-10 DIAGNOSIS — I2510 Atherosclerotic heart disease of native coronary artery without angina pectoris: Secondary | ICD-10-CM | POA: Diagnosis not present

## 2021-10-10 DIAGNOSIS — E1165 Type 2 diabetes mellitus with hyperglycemia: Secondary | ICD-10-CM | POA: Diagnosis not present

## 2021-10-10 DIAGNOSIS — E785 Hyperlipidemia, unspecified: Secondary | ICD-10-CM | POA: Diagnosis not present

## 2021-10-10 DIAGNOSIS — I10 Essential (primary) hypertension: Secondary | ICD-10-CM | POA: Diagnosis not present

## 2021-10-10 DIAGNOSIS — I480 Paroxysmal atrial fibrillation: Secondary | ICD-10-CM | POA: Diagnosis not present

## 2021-10-31 ENCOUNTER — Other Ambulatory Visit: Payer: Self-pay | Admitting: Cardiology

## 2021-10-31 ENCOUNTER — Other Ambulatory Visit: Payer: Self-pay

## 2021-10-31 DIAGNOSIS — M79641 Pain in right hand: Secondary | ICD-10-CM | POA: Diagnosis not present

## 2021-10-31 DIAGNOSIS — M79642 Pain in left hand: Secondary | ICD-10-CM | POA: Diagnosis not present

## 2021-10-31 DIAGNOSIS — M19042 Primary osteoarthritis, left hand: Secondary | ICD-10-CM | POA: Diagnosis not present

## 2021-10-31 DIAGNOSIS — M19041 Primary osteoarthritis, right hand: Secondary | ICD-10-CM | POA: Diagnosis not present

## 2021-12-10 DIAGNOSIS — I10 Essential (primary) hypertension: Secondary | ICD-10-CM | POA: Diagnosis not present

## 2021-12-10 DIAGNOSIS — E785 Hyperlipidemia, unspecified: Secondary | ICD-10-CM | POA: Diagnosis not present

## 2021-12-10 DIAGNOSIS — E1165 Type 2 diabetes mellitus with hyperglycemia: Secondary | ICD-10-CM | POA: Diagnosis not present

## 2022-01-16 ENCOUNTER — Other Ambulatory Visit: Payer: Self-pay | Admitting: Cardiology

## 2022-01-21 DIAGNOSIS — E785 Hyperlipidemia, unspecified: Secondary | ICD-10-CM | POA: Diagnosis not present

## 2022-01-21 DIAGNOSIS — F5104 Psychophysiologic insomnia: Secondary | ICD-10-CM | POA: Diagnosis not present

## 2022-01-21 DIAGNOSIS — I482 Chronic atrial fibrillation, unspecified: Secondary | ICD-10-CM | POA: Diagnosis not present

## 2022-01-21 DIAGNOSIS — E1165 Type 2 diabetes mellitus with hyperglycemia: Secondary | ICD-10-CM | POA: Diagnosis not present

## 2022-01-21 DIAGNOSIS — I2510 Atherosclerotic heart disease of native coronary artery without angina pectoris: Secondary | ICD-10-CM | POA: Diagnosis not present

## 2022-01-21 DIAGNOSIS — Z2821 Immunization not carried out because of patient refusal: Secondary | ICD-10-CM | POA: Diagnosis not present

## 2022-01-21 DIAGNOSIS — I10 Essential (primary) hypertension: Secondary | ICD-10-CM | POA: Diagnosis not present

## 2022-01-21 DIAGNOSIS — J849 Interstitial pulmonary disease, unspecified: Secondary | ICD-10-CM | POA: Diagnosis not present

## 2022-01-30 ENCOUNTER — Telehealth: Payer: Self-pay | Admitting: Cardiology

## 2022-01-30 NOTE — Telephone Encounter (Signed)
Recommendations reviewed with Christian Patton as per Dr. Joya Gaskins note.  Christian Patton verbalized understanding and had no additional questions.

## 2022-01-30 NOTE — Telephone Encounter (Signed)
Spoke with the pt and his wife. Pt had a nose bleed this am that lasted for a while. Pt was able to get it stopped with "bleed stop applied to a small tampax" and inserted in the nare. Pt states this stopped the bleed immediately. Pt's wife states this is the patients 2nd nose bleed. Pt's wife states she has taken his Eliquis out of his medications until you advise them on what to do.

## 2022-01-30 NOTE — Telephone Encounter (Signed)
Pt c/o medication issue:  1. Name of Medication:   ELIQUIS 5 MG TABS tablet    2. How are you currently taking this medication (dosage and times per day)?   TAKE 1 TABLET(5 MG) BY MOUTH TWICE DAILY    3. Are you having a reaction (difficulty breathing--STAT)? No  4. What is your medication issue? Pt's wife states that pt had a really bad nose bleed this morning and did not take medication. She would like a callback regarding this matter. Please advise

## 2022-03-24 ENCOUNTER — Telehealth: Payer: Self-pay | Admitting: Cardiology

## 2022-03-24 MED ORDER — ROSUVASTATIN CALCIUM 5 MG PO TABS: 5.0000 mg | ORAL_TABLET | Freq: Every day | ORAL | 1 refills | Status: DC

## 2022-03-24 NOTE — Telephone Encounter (Signed)
*  STAT* If patient is at the pharmacy, call can be transferred to refill team.   1. Which medications need to be refilled? (please list name of each medication and dose if known) rosuvastatin (CRESTOR) 5 MG tablet   2. Which pharmacy/location (including street and city if local pharmacy) is medication to be sent to? [facility] [place] [facility]   3. Do they need a 30 day or 90 day supply? [place]

## 2022-03-24 NOTE — Telephone Encounter (Signed)
Rosuvastatin 5 mg # 90 x 1 refill sent to pharmacy [facility]

## 2022-04-05 ENCOUNTER — Other Ambulatory Visit: Payer: Self-pay | Admitting: Cardiology

## 2022-04-07 NOTE — Telephone Encounter (Signed)
Prescription refill request for Eliquis received. Indication: AFIB  Last office visit: Christian Patton, 08/01/2021 Scr: 1.2, 01/21/2022 Age: 82 yo  Weight: 68.4 kg   Refill sent.

## 2022-04-08 ENCOUNTER — Telehealth: Payer: Self-pay | Admitting: Cardiology

## 2022-04-08 MED ORDER — APIXABAN 5 MG PO TABS: ORAL_TABLET | ORAL | 1 refills | Status: DC

## 2022-04-08 NOTE — Telephone Encounter (Signed)
Prescription refill request for Eliquis received. Indication: Afib  Last office visit: 08/01/21 [facility])  Scr: 1.2 (01/21/22)  Age: 82 Weight: 68.4kg  Appropriate dose. Refill sent.

## 2022-04-08 NOTE — Telephone Encounter (Signed)
*  STAT* If patient is at the pharmacy, call can be transferred to refill team.   1. Which medications need to be refilled? (please list name of each medication and dose if known) ELIQUIS 5 MG TABS tablet   2. Which pharmacy/location (including street and city if local pharmacy) is medication to be sent to? [facility] [place] [place] [facility]   3. Do they need a 30 day or 90 day supply? 90 day  Pt spouse stated they aren't able to use coupon anymore and requesting to send a new prescription to [place].

## 2022-04-22 DIAGNOSIS — I482 Chronic atrial fibrillation, unspecified: Secondary | ICD-10-CM | POA: Diagnosis not present

## 2022-04-22 DIAGNOSIS — I10 Essential (primary) hypertension: Secondary | ICD-10-CM | POA: Diagnosis not present

## 2022-04-22 DIAGNOSIS — E785 Hyperlipidemia, unspecified: Secondary | ICD-10-CM | POA: Diagnosis not present

## 2022-04-22 DIAGNOSIS — E1165 Type 2 diabetes mellitus with hyperglycemia: Secondary | ICD-10-CM | POA: Diagnosis not present

## 2022-04-22 DIAGNOSIS — J849 Interstitial pulmonary disease, unspecified: Secondary | ICD-10-CM | POA: Diagnosis not present

## 2022-04-22 DIAGNOSIS — I2510 Atherosclerotic heart disease of native coronary artery without angina pectoris: Secondary | ICD-10-CM | POA: Diagnosis not present

## 2022-04-22 DIAGNOSIS — F5104 Psychophysiologic insomnia: Secondary | ICD-10-CM | POA: Diagnosis not present

## 2022-05-07 ENCOUNTER — Telehealth: Payer: Self-pay | Admitting: Cardiology

## 2022-05-07 NOTE — Telephone Encounter (Signed)
Spoke with Christian Patton per DPR who states that last night around 2300 pt was complaining of dizziness. Christian Patton states she got him to sit down but states she did not think he looked good. Christian Patton states that he went to the bathroom and when he came out he went to the bedroom and was getting in bed on the wrong side and did not realize what he was doing. Christian Patton states this am he seems normal but does not remember any of the events that happened last pm. Pt would not let her take his BP last night but yesterday earlier it was 179/91 then 142/73. Denies chest pain. Advised to call PCP as well as this could be stroke sx.

## 2022-05-07 NOTE — Telephone Encounter (Signed)
STAT if patient feels like he/she is going to faint   Are you dizzy now? no  Do you feel faint or have you passed out? no  Do you have any other symptoms? No memory of the dizzy episode  Have you checked your HR and BP (record if available)? Wife states he refused to let her take his BP  Patient's wife states the patient had a dizzy spell at 11:00 pm last night. She says he got up and then to the bathroom and then tried to get into her side of the bed to sleep. She says she walked to him and walked him to the other side and he went to sleep. She says this was about 15 min in total, but the patient has no recollection of the event and does not remember taking his medication either.

## 2022-05-19 DIAGNOSIS — J209 Acute bronchitis, unspecified: Secondary | ICD-10-CM | POA: Diagnosis not present

## 2022-05-29 DIAGNOSIS — J209 Acute bronchitis, unspecified: Secondary | ICD-10-CM | POA: Diagnosis not present

## 2022-05-29 DIAGNOSIS — R918 Other nonspecific abnormal finding of lung field: Secondary | ICD-10-CM | POA: Diagnosis not present

## 2022-05-29 DIAGNOSIS — R053 Chronic cough: Secondary | ICD-10-CM | POA: Diagnosis not present

## 2022-05-30 ENCOUNTER — Telehealth: Payer: Self-pay | Admitting: Internal Medicine

## 2022-05-30 DIAGNOSIS — J849 Interstitial pulmonary disease, unspecified: Secondary | ICD-10-CM

## 2022-05-30 NOTE — Telephone Encounter (Signed)
Christian Patton called. Christian Patton (PT) has not been seen by Christian Patton in a long time. He has not been doing well and resisted seeing even his PCP. One he did see them they issud him inhalers.  She wants Christian Patton to see him.  I rev last AVS and see Dr. requested several BW tests and PFT. I was able to find her an appt, made it for 30 min for Tue and she would like to know if he should have a CT done before he comes in this Tue or any other testing.They reside in [place].  Pls call Mrs. @ [phone] to advise.

## 2022-06-01 DIAGNOSIS — I10 Essential (primary) hypertension: Secondary | ICD-10-CM | POA: Diagnosis not present

## 2022-06-01 DIAGNOSIS — I4891 Unspecified atrial fibrillation: Secondary | ICD-10-CM | POA: Diagnosis not present

## 2022-06-01 DIAGNOSIS — I509 Heart failure, unspecified: Secondary | ICD-10-CM | POA: Diagnosis not present

## 2022-06-01 DIAGNOSIS — E1165 Type 2 diabetes mellitus with hyperglycemia: Secondary | ICD-10-CM | POA: Diagnosis not present

## 2022-06-01 DIAGNOSIS — I2510 Atherosclerotic heart disease of native coronary artery without angina pectoris: Secondary | ICD-10-CM | POA: Diagnosis not present

## 2022-06-01 DIAGNOSIS — Z86711 Personal history of pulmonary embolism: Secondary | ICD-10-CM | POA: Diagnosis not present

## 2022-06-01 DIAGNOSIS — R918 Other nonspecific abnormal finding of lung field: Secondary | ICD-10-CM | POA: Diagnosis not present

## 2022-06-01 DIAGNOSIS — R9431 Abnormal electrocardiogram [ECG] [EKG]: Secondary | ICD-10-CM | POA: Diagnosis not present

## 2022-06-01 DIAGNOSIS — I110 Hypertensive heart disease with heart failure: Secondary | ICD-10-CM | POA: Diagnosis not present

## 2022-06-01 DIAGNOSIS — J8410 Pulmonary fibrosis, unspecified: Secondary | ICD-10-CM | POA: Diagnosis not present

## 2022-06-01 DIAGNOSIS — J440 Chronic obstructive pulmonary disease with acute lower respiratory infection: Secondary | ICD-10-CM | POA: Diagnosis not present

## 2022-06-01 DIAGNOSIS — Z7901 Long term (current) use of anticoagulants: Secondary | ICD-10-CM | POA: Diagnosis not present

## 2022-06-01 DIAGNOSIS — K219 Gastro-esophageal reflux disease without esophagitis: Secondary | ICD-10-CM | POA: Diagnosis not present

## 2022-06-01 DIAGNOSIS — J441 Chronic obstructive pulmonary disease with (acute) exacerbation: Secondary | ICD-10-CM | POA: Diagnosis not present

## 2022-06-01 DIAGNOSIS — J189 Pneumonia, unspecified organism: Secondary | ICD-10-CM | POA: Diagnosis not present

## 2022-06-01 DIAGNOSIS — E785 Hyperlipidemia, unspecified: Secondary | ICD-10-CM | POA: Diagnosis not present

## 2022-06-02 DIAGNOSIS — J441 Chronic obstructive pulmonary disease with (acute) exacerbation: Secondary | ICD-10-CM | POA: Diagnosis not present

## 2022-06-02 DIAGNOSIS — J440 Chronic obstructive pulmonary disease with acute lower respiratory infection: Secondary | ICD-10-CM | POA: Diagnosis not present

## 2022-06-02 DIAGNOSIS — J189 Pneumonia, unspecified organism: Secondary | ICD-10-CM | POA: Diagnosis not present

## 2022-06-02 NOTE — Telephone Encounter (Signed)
MR, can you please advise? Thanks.  

## 2022-06-02 NOTE — Telephone Encounter (Signed)
Called and spoke with patient's wife. She verbalized understanding. Order has been placed and PCCs notified.   Nothing further needed at time of call.

## 2022-06-02 NOTE — Telephone Encounter (Signed)
If he can have a high-resolution CT chest today or tomorrow but well before he sees me ideally today especially in [place] but if [place] does not work at least [employer] it would be extremely helpful

## 2022-06-02 NOTE — Telephone Encounter (Signed)
PCCs can this be done?

## 2022-06-02 NOTE — Telephone Encounter (Signed)
Will need order for a CT and will need to put in as an emergency order to get done before appt tomorrow.

## 2022-06-03 ENCOUNTER — Ambulatory Visit (INDEPENDENT_AMBULATORY_CARE_PROVIDER_SITE_OTHER): Payer: Medicare Other | Admitting: Internal Medicine

## 2022-06-03 ENCOUNTER — Encounter: Payer: Self-pay | Admitting: Internal Medicine

## 2022-06-03 VITALS — BP 130/80 | HR 74 | Ht 64.0 in | Wt 151.6 lb

## 2022-06-03 DIAGNOSIS — R0609 Other forms of dyspnea: Secondary | ICD-10-CM | POA: Diagnosis not present

## 2022-06-03 DIAGNOSIS — I2510 Atherosclerotic heart disease of native coronary artery without angina pectoris: Secondary | ICD-10-CM | POA: Diagnosis not present

## 2022-06-03 DIAGNOSIS — R768 Other specified abnormal immunological findings in serum: Secondary | ICD-10-CM

## 2022-06-03 DIAGNOSIS — I517 Cardiomegaly: Secondary | ICD-10-CM | POA: Diagnosis not present

## 2022-06-03 DIAGNOSIS — J479 Bronchiectasis, uncomplicated: Secondary | ICD-10-CM | POA: Diagnosis not present

## 2022-06-03 DIAGNOSIS — J84112 Idiopathic pulmonary fibrosis: Secondary | ICD-10-CM | POA: Diagnosis not present

## 2022-06-03 DIAGNOSIS — J849 Interstitial pulmonary disease, unspecified: Secondary | ICD-10-CM | POA: Diagnosis not present

## 2022-06-03 DIAGNOSIS — R591 Generalized enlarged lymph nodes: Secondary | ICD-10-CM | POA: Diagnosis not present

## 2022-06-03 DIAGNOSIS — K8020 Calculus of gallbladder without cholecystitis without obstruction: Secondary | ICD-10-CM | POA: Diagnosis not present

## 2022-06-03 DIAGNOSIS — R918 Other nonspecific abnormal finding of lung field: Secondary | ICD-10-CM | POA: Diagnosis not present

## 2022-06-03 DIAGNOSIS — Z8701 Personal history of pneumonia (recurrent): Secondary | ICD-10-CM | POA: Diagnosis not present

## 2022-06-03 DIAGNOSIS — I700 Atherosclerosis of aorta: Secondary | ICD-10-CM | POA: Diagnosis not present

## 2022-06-03 DIAGNOSIS — J432 Centrilobular emphysema: Secondary | ICD-10-CM | POA: Diagnosis not present

## 2022-06-03 NOTE — Patient Instructions (Addendum)
ICD-10-CM   1. 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 ANA positive  R76.8     4. History of pneumonia  Z87.01      You had left lower lobe pneumonia April, 2024 Glad you are better from the hospitalization The cause of this pneumonia is not known but you denied any aspiration; most likely community-acquired pneumonia Pulmonary fibrosis itself may not be significantly worse because he did not drop oxygen at the hospital yesterday with walking  -But it requires monitoring   Plan - do cbc, bmet, lft, bnp 06/03/2022 -Complete prednisone and azithromycin given by the hospital  -Repeat high-resolution CT chest supine and prone in 8-10 weeks -Spirometry and DLCO in 8-10 weeks -Continue Breztri for the moment  -If sputum continues to get stuck in your throat and he feels dry we might have to stop this   [place] -8-10 weeks with Dr. Marchelle Gearing or nurse practitioner after CT scan and pulmonary function test

## 2022-06-03 NOTE — Addendum Note (Signed)
Addended by: Hedda Slade on: 06/03/2022 03:26 PM   Modules accepted: Orders

## 2022-06-03 NOTE — Progress Notes (Signed)
OV 03/05/2021  Subjective:  Patient ID: Christian Patton, male , DOB: 05-12-1940 , age 82 y.o. , MRN: [id number] , ADDRESS: [address] [place] [address] PCP Hurshel Party, NP Patient Care Team: Hurshel Party, NP as PCP - General (Internal Medicine) Tommye Standard, MD (Urology)  This Provider for this visit: Treatment Team:  Attending Provider: Kalman Shan, MD    03/05/2021 -   Chief Complaint  Patient presents with   Consult    Pt had recent HRCT performed which is the reason for today's visit. Pt has had complaints of SOB and a cough about a month ago but currently denies any complaints.     HPI Christian Patton 82 y.o. -here at the referral of his primary care physician.  He almost felt well enough that he did not want to make this visit but came because his wife asking that he visit with [place].  I personally visualized the CT scan in 2013 done within the health system that showed some ILD changes.  However he is unaware of that.  He tells me that on an average he is extremely active he walks daily 1 mile.  He walks his dog.  He also chops wood.  Wife says he is always physically active.  He never feels any shortness of breath for this.  Then approximately 1 month ago he started having some cough and shortness of breath.  He felt the cough is different from her regular bronchitis.  It lasted 2 weeks and then spontaneously started getting better.  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  He tells me now he is back to his baseline he is able to walk a mile without a problem is able to walk his dog.  He is chopping words.  He is wondering why he has to be here   [employer] Integrated Comprehensive ILD Questionnaire  Symptoms:  Listed below.  Past Medical  History :  -He had hepatitis C long time back.  He was on a clinical trial with [facility].  Gave him a cure. -Is hard of hearing and wears hearing aid. -Has osteoarthritis -Has type 2 diabetes - Denies any cirrhosis, asthma COPD or connective tissue disease. - Denies any acid reflux.  Denies any stroke or MI or cancers   ROS:  -He does have chronic arthritis but denies its rheumatoid arthritis. - He does not have any fatigue.  No snoring. - No acid reflux  FAMILY HISTORY of LUNG DISEASE:  -Denies any pulmonary fibrosis or COPD or asthma or cystic fibrosis.  Denies HP.  Denies autoimmune disease denies premature graying of the hair.  PERSONAL EXPOSURE HISTORY:  -Smoked heavily between 1960 1991 to 2 packs/day.  In the past he smokes cigars.  He is also smoked pipes.  Never did any marijuana.  Never did any cocaine no intravenous drug use.  HOME  EXPOSURE and HOBBY DETAILS :  [place]  The home itself is 82 years old.  No mold or mildew exposure.  No dampness in the living environment no nebulizer use.  No steam and use no Jacuzzi use.  No misting Fountain no pet birds.  No pet gerbils no  feather pillows.  No mold in the [facility] duct.  No music habits -He does chop a lot of wood but there is no mold in it -He does a lot of woodworking there is lot of sawdust with it. -He has dogs and cats.  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  Currently chops a lot of wood there is no mold.  He does get exposed to a lot of sawdust for several years. 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 , MRN: [id number] , ADDRESS: [address] [place] [address] PCP Hurshel Party, NP Patient Care Team: Hurshel Party, NP as PCP - General (Internal Medicine) Tommye Standard, MD (Urology)  This Provider for this visit: Treatment Team:  Attending Provider: Kalman Shan, MD  Type of visit: Video Circumstance: COVID-19 national emergency Identification of patient Christian Patton with 07-08-1940 and MRN [id number] - 2 person identifier Risks: Risks, benefits, limitations of telephone visit explained. Patient understood and verbalized agreement to proceed Anyone else on call: wife Patient location: his home This provider location: [address], [address]; [place]; [place] [address]. [facility]. [phone]    03/14/2021 -  cc: Video visit to discuss test results.  HPI Christian Patton 82 y.o. -he presents with his wife.  About connected with them via video.  He is [id number] 5 infusion study.  His DLCO is modestly decreased to 59%.  His serology shows negative hypersensitivity pneumonitis panel.  However his ANA is positive at 1: 640.  He doees lower extremity joint pain while walking.  He is always put this down to osteoarthritis.  He does not know if he has rheumatoid arthritis.  He does not know if he has any connective tissue disease.  His rheumatoid factor and CCP are negative.  I discussed the need for rheumatology visit to ensure there is no connective tissue disease causing his ILD.  He and his wife are open to this idea.  They are still interested only in clinical  trials.  He does not qualify for the infusion study.  However there is an inhaler study with treprostinil versus placebo.  I emailed him the consent during this office visit but I did inform him that we would want to wait till the rheumatologist evaluated him and also discuss in in our multidisciplinary case conference.  He is fine with this approach. PFT AUTOIMMUNE   Latest Reference Range & Units 03/05/21 12:25  Anti Nuclear Antibody (ANA) NEGATIVE  POSITIVE !  ANA Pattern 1  Cytoplasmic, Rods and Rings !  ANA Titer 1 titer 1:640 (H)  ds DNA Ab IU/mL  3  RA Latex Turbid. <14 IU/mL <14  !: Data is abnormal (H): Data is abnormally high  Latest Reference Range & Units 03/05/21 12:25  A.Fumigatus #1 Abs Negative  Negative  Micropolyspora faeni, IgG Negative  Negative  Thermoactinomyces vulgaris, IgG Negative  Negative  A. Pullulans Abs Negative  Negative  Thermoact. Saccharii Negative  Negative  Pigeon Serum Abs Negative  Negative    OV 06/03/2022  Subjective:  Patient ID: Christian Patton, male , DOB: [birth year] , age 28 y.o. , MRN: [id number] , ADDRESS: [address] [place] [address] PCP Hurshel Party, NP Patient Care Team: Hurshel Party, NP as PCP - General (Internal Medicine) Tommye Standard, MD (Urology)  This Provider for this visit: Treatment Team:  Attending Provider: Kalman Shan, MD    06/03/2022 -   Chief Complaint  Patient presents with   Hospitalization Follow-up    Hosp. F/up     HPI ALPHONSE ASBRIDGE 82 y.o. -IPF follow-up.  Presents with his wife who is a significant independent historian today.  I have not seen him in 14 months.  There is an acute visit.  On 05/31/2022 he ended up in the emergency department following worsening cough and sputum production.  He was discharged 06/02/2022.  He was requiring oxygen but at the time of discharge the said he was subjected to exercise hypoxemia test was negative.  This was all [facility] they treated him with  azithromycin steroids and nebulizers and oxygen.  They discharged him with azithromycin and prednisone lasting till 06/06/2022.  He says things started deteriorating from December 2023 when he started having significant amount of runny nose and then intermittently it was getting better and getting worse and then finally it started getting worse over a few weeks.  I personally visualized the CT scan done at [facility].  There is a left lower lobe airspace disease.  He does not have any vomiting history.  He is dentures and does not have any carious teeth.  Currently he is trying to use his tractor.  His wife is worried about that.  I did indicate to them that it is okay to do resting activities and try to exert to the extent possible.  It is important avoid physical deconditioning  His main concern today is that he feels sputum is getting stuck in the throat.  It is more so after the illness.  May be some better.  He is using Breztri triple inhaler therapy given by his primary care doctor.  I do not know why.  But I told him to continue that for now advised he could use Mucinex as needed if it is not getting better can call us and we can refer him to ENT  DYSpnea SCALE - ILD 03/05/2021  Current weight   O2 use ra  Shortness of Breath 0 -> 5 scale with 5 being worst (score 6 If unable to do)  At rest 0  Simple tasks - showers, clothes change, eating, shaving 0  Household (dishes, doing bed, laundry) 0  Shopping 0  Walking level at own pace 0  Walking up Stairs 1  Total (30-36) Dyspnea Score 1      Non-dyspnea symptoms (0-> 5 scale) 03/05/2021  How bad is your cough? 0  How bad is your fatigue 0  How bad is nausea 0  How bad is vomiting?  0  How bad is diarrhea? 0  How bad is anxiety? 0  How bad is  depression 0  Any chronic pain - if so where and how bad Legs hurt when he walks    PFT     Latest Ref Rng & Units 03/06/2021    9:50 AM  PFT Results  FVC-Pre L 3.42   FVC-Predicted Pre %  111   FVC-Post L 3.44   FVC-Predicted Post % 112   Pre FEV1/FVC % % 81   Post FEV1/FCV % % 83   FEV1-Pre L 2.78   FEV1-Predicted Pre % 130   FEV1-Post L 2.85   DLCO uncorrected ml/min/mmHg 11.98   DLCO UNC% % 59   DLCO corrected ml/min/mmHg 11.98   DLCO COR %Predicted % 59   DLVA Predicted % 62   TLC L 5.10   TLC % Predicted % 87   RV % Predicted % 69        has a past medical history of Acute renal failure (07/12/2014), Arthralgia of both knees (06/15/2014), Arthritis, Atherosclerotic heart disease of native coronary artery without angina pectoris (10/05/14), Chronic anticoagulation (10/05/14), 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), Coronary artery disease, Diabetes mellitus without complication, Essential hypertension (October 05, 2014), Exposure to hepatitis C (12/26/2016), GERD (gastroesophageal reflux disease), Hepatitis, History of other specified conditions presenting hazards to health (October 05, 2014), Hypertension, 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   reports that he quit smoking about 31 years ago. His smoking use included cigarettes. He started smoking about 65 years ago. He has a 36.00 pack-year smoking history. He has been exposed to tobacco smoke. He has quit using smokeless tobacco.  His smokeless tobacco use included chew.  Past Surgical History:  Procedure Laterality Date   CARDIAC CATHETERIZATION     x 3    Last one was in 2011 @ [facility]   TONSILLECTOMY     TOTAL KNEE ARTHROPLASTY Left 07/03/2014   Procedure: LEFT TOTAL KNEE ARTHOPLASTY;  Surgeon: Dannielle Huh, MD;  Location:  [facility] OR;  Service: Orthopedics;  Laterality: Left;    Allergies  Allergen Reactions   Metformin Diarrhea   Methocarbamol Other (See Comments)    Spouse states pt "acted crazy" after taking     Immunization History  Administered Date(s) Administered   Fluad Quad(high Dose 65+) 11/26/2018   Influenza-Unspecified 11/17/2016, 12/11/2017   Pneumococcal-Unspecified 12/12/2013    Family History  Problem Relation Age of Onset   Heart attack Mother    Heart attack Father      Current Outpatient Medications:    apixaban (ELIQUIS) 5 MG TABS tablet, TAKE 1 TABLET(5 MG) BY MOUTH TWICE DAILY, Disp: 180 tablet, Rfl: 1   Ascorbic Acid (VITAMIN C) 1000 MG tablet, Take 1,000 mg by mouth daily., Disp: , Rfl:    azithromycin (ZITHROMAX) 250 MG tablet, Take 250 mg by mouth daily., Disp: , Rfl:    Budeson-Glycopyrrol-Formoterol (BREZTRI AEROSPHERE) 160-9-4.8 MCG/ACT AERO, Inhale 2 each into the lungs daily., Disp: , Rfl:    calcium gluconate 500 MG tablet, Take 1 tablet by mouth daily., Disp: , Rfl:    cholecalciferol (VITAMIN D3) 25 MCG (1000 UNIT) tablet, Take 1,000 Units by mouth daily., Disp: , Rfl:    hydrochlorothiazide (HYDRODIURIL) 25 MG tablet, TAKE 1 TABLET BY MOUTH DAILY FOR BLOOD PRESSURE OVER 140/80, Disp: 90 tablet, Rfl: 2  losartan (COZAAR) 25 MG tablet, TAKE 1 TABLET(25 MG) BY MOUTH DAILY, Disp: 90 tablet, Rfl: 2   MAGNESIUM GLYCINATE PLUS PO, Take 200 mg by mouth daily., Disp: , Rfl:    Melatonin 10 MG TABS, Take 1 tablet by mouth at bedtime., Disp: , Rfl:    metoprolol succinate (TOPROL-XL) 25 MG 24 hr tablet, Take 12.5 mg by mouth daily., Disp: , Rfl:    Multiple Vitamins-Minerals (MENS 50+ MULTIVITAMIN) TABS, Take 1 tablet by mouth daily., Disp: , Rfl:    Omega-3 Fatty Acids (FISH OIL) 600 MG CAPS, Take 1 capsule by mouth daily., Disp: , Rfl:    predniSONE (DELTASONE) 10 MG tablet, Take 10 mg by mouth daily with breakfast., Disp: , Rfl:    Quercetin 250 MG TABS, Take 500 mg by  mouth daily., Disp: , Rfl:    QUEtiapine (SEROQUEL) 25 MG tablet, Take 50 mg by mouth at bedtime. , Disp: , Rfl:    rosuvastatin (CRESTOR) 5 MG tablet, Take 1 tablet (5 mg total) by mouth daily., Disp: 90 tablet, Rfl: 1   TRULICITY 0.75 MG/0.5ML SOPN, Inject into the skin., Disp: , Rfl:    Zinc 50 MG TABS, Take by mouth daily., Disp: , Rfl:       Objective:   Vitals:   06/03/22 1441  BP: 130/80  Pulse: 74  SpO2: 94%  Weight: 151 lb 9.6 oz (68.8 kg)  Height:  (1.626 m)    Estimated body mass index is 26.02 kg/m as calculated from the following:   Height as of this encounter:  (1.626 m).   Weight as of this encounter: 151 lb 9.6 oz (68.8 kg).  @  [employer]   06/03/22 1441  Weight: 151 lb 9.6 oz (68.8 kg)     Physical Exam   General: No distress. Looks thin.Mild decondtioned Neuro: Alert and Oriented x 3. GCS 15. Speech normal Psych: Pleasant Resp:  Barrel Chest - no.  Wheeze - no, Crackles - yes base, No overt respiratory distress CVS: Normal heart sounds. Murmurs - no Ext: Stigmata of Connective Tissue Disease - no HEENT: Normal upper airway. PEERL +. 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 ANA positive  R76.8     4. 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 ANA positive  R76.8     4. History of pneumonia  Z87.01      You had left lower lobe pneumonia April, 2024 Glad you are better from the hospitalization The cause of this pneumonia is not known but you denied any aspiration; most likely community-acquired pneumonia Pulmonary fibrosis itself may not be significantly worse because he did not drop oxygen at the hospital yesterday with  walking  -But it requires monitoring   Plan - do cbc, bmet, lft, bnp 06/03/2022 -Complete prednisone and azithromycin given by the hospital  -Repeat high-resolution CT chest supine and prone in 8-10 weeks -Spirometry and DLCO in 8-10 weeks -Continue Breztri for the moment  -If sputum continues to get stuck in your throat  and he feels dry we might have to stop this   Folllowup -8-10 weeks with Dr. Marchelle Gearing or nurse practitioner after CT scan and pulmonary function test    SIGNATURE    Dr. Kalman Shan, M.D., F.C.C.P,  Pulmonary and Critical Care Medicine Staff Physician, [facility] Center Director - Interstitial Lung Disease  Program  Pulmonary Fibrosis [facility] Network at [facility] [place], [place], [address]  Pager: [phone], If no answer or between  15:00h - 7:00h: call [phone] Telephone: [phone]  3:20 PM 06/03/2022

## 2022-06-04 LAB — CBC WITH DIFFERENTIAL/PLATELET
Basophils Absolute: 0.1 10*3/uL (ref 0.0–0.1)
Basophils Relative: 0.4 % (ref 0.0–3.0)
Eosinophils Absolute: 0 10*3/uL (ref 0.0–0.7)
Eosinophils Relative: 0.1 % (ref 0.0–5.0)
HCT: 38.2 % — ABNORMAL LOW (ref 39.0–52.0)
Hemoglobin: 12.6 g/dL — ABNORMAL LOW (ref 13.0–17.0)
Lymphocytes Relative: 1.9 % — ABNORMAL LOW (ref 12.0–46.0)
Lymphs Abs: 0.4 10*3/uL — ABNORMAL LOW (ref 0.7–4.0)
MCHC: 33 g/dL (ref 30.0–36.0)
MCV: 85.9 fl (ref 78.0–100.0)
Monocytes Absolute: 0.2 10*3/uL (ref 0.1–1.0)
Monocytes Relative: 1.2 % — ABNORMAL LOW (ref 3.0–12.0)
Neutro Abs: 19.5 10*3/uL — ABNORMAL HIGH (ref 1.4–7.7)
Neutrophils Relative %: 96.4 % — ABNORMAL HIGH (ref 43.0–77.0)
Platelets: 306 10*3/uL (ref 150.0–400.0)
RBC: 4.45 Mil/uL (ref 4.22–5.81)
RDW: 14.7 % (ref 11.5–15.5)
WBC: 20.2 10*3/uL (ref 4.0–10.5)

## 2022-06-04 LAB — HEPATIC FUNCTION PANEL
ALT: 53 U/L (ref 0–53)
AST: 33 U/L (ref 0–37)
Albumin: 3.6 g/dL (ref 3.5–5.2)
Alkaline Phosphatase: 92 U/L (ref 39–117)
Bilirubin, Direct: 0.1 mg/dL (ref 0.0–0.3)
Total Bilirubin: 0.5 mg/dL (ref 0.2–1.2)
Total Protein: 7.1 g/dL (ref 6.0–8.3)

## 2022-06-04 LAB — BASIC METABOLIC PANEL
BUN: 31 mg/dL — ABNORMAL HIGH (ref 6–23)
CO2: 26 mEq/L (ref 19–32)
Calcium: 9.4 mg/dL (ref 8.4–10.5)
Chloride: 94 mEq/L — ABNORMAL LOW (ref 96–112)
Creatinine, Ser: 1 mg/dL (ref 0.40–1.50)
GFR: 70.46 mL/min (ref >60.00)
Glucose, Bld: 457 mg/dL — ABNORMAL HIGH (ref 70–99)
Potassium: 5.5 mEq/L — ABNORMAL HIGH (ref 3.5–5.1)
Sodium: 128 mEq/L — ABNORMAL LOW (ref 135–145)

## 2022-06-04 LAB — BRAIN NATRIURETIC PEPTIDE: Pro B Natriuretic peptide (BNP): 336 pg/mL — ABNORMAL HIGH (ref 0.0–100.0)

## 2022-06-05 ENCOUNTER — Telehealth: Payer: Self-pay | Admitting: Internal Medicine

## 2022-06-05 ENCOUNTER — Encounter: Payer: Self-pay | Admitting: *Deleted

## 2022-06-05 NOTE — Telephone Encounter (Signed)
  Several abnormalities but the main 1 is that the  -White count was quite high 20,000.  I do not know what it was in the hospital May.  Maybe it is lower than that  -BNP is also high  -Sodium is low but at baseline  -Hemoglobin is low with anemia but baseline  -Potassium is borderline high  And he was feeling fine when I saw him post anemia recovery     Plan - Therefore if he is feeling okay Best to watch -But otherwise go to the ER -Also recheck CBC with differential, chemistry in 7 to 14 days -Get echocardiogram  -Return to see nurse practitioner in the next few weeks to discuss follow-up test results    LABS    PULMONARY No results for input(s): "PHART", "PCO2ART", "PO2ART", "HCO3", "TCO2", "O2SAT" in the last 168 hours.  Invalid input(s): "PCO2", "PO2"  CBC Recent Labs  Lab 06/03/22 1536  HGB 12.6*  HCT 38.2*  WBC 20.2 Repeated and verified X2.*  PLT 306.0    COAGULATION No results for input(s): "INR" in the last 168 hours.  CARDIAC  No results for input(s): "TROPONINI" in the last 168 hours. Recent Labs  Lab 06/03/22 1536  PROBNP 336.0*     CHEMISTRY Recent Labs  Lab 06/03/22 1536  NA 128*  K 5.5*  CL 94*  CO2 26  GLUCOSE 457*  BUN 31*  CREATININE 1.00  CALCIUM 9.4   Estimated Creatinine Clearance: 48.5 mL/min (by C-G formula based on SCr of 1 mg/dL).   LIVER Recent Labs  Lab 06/03/22 1536  AST 33  ALT 53  ALKPHOS 92  BILITOT 0.5  PROT 7.1  ALBUMIN 3.6     INFECTIOUS No results for input(s): "LATICACIDVEN", "PROCALCITON" in the last 168 hours.   ENDOCRINE CBG (last 3)  No results for input(s): "GLUCAP" in the last 72 hours.       IMAGING x48h  - image(s) personally visualized  -   highlighted in bold No results found.

## 2022-06-05 NOTE — Telephone Encounter (Signed)
Call Report. [facility]. Pls call.   

## 2022-06-05 NOTE — Telephone Encounter (Signed)
Called [facility] and spoke with Aram Beecham. Stated to her that we had no results of any imaging being done recently on pt. Per Aram Beecham, pt had a HRCT done 4/24.  I told her again that we have no report of any results. Only thing we are seeing where the order had been placed. She said that she would send this over to have the report pushed through for [place] to be able to view.  Will await the results to show up.

## 2022-06-06 NOTE — Telephone Encounter (Signed)
Received a call from Erskine Squibb at [facility]. She was calling for a call report for patient's CT that was done on 04/24 at [facility]. Verified that I could see the images and report, she verbalized understanding.   Patient is listed in PACS as under "[id number]".

## 2022-06-06 NOTE — Telephone Encounter (Signed)
This was done at [facility] so we may not have anything.

## 2022-06-06 NOTE — Telephone Encounter (Signed)
The CT scan of the chest that I see done in April 2024 was June 03, 2022.  Did the patient have another CT scan after that?  Because I really wanted a CT scan repeated in 8 weeks.  This because he was admitted with pneumonia  Plan - Instead of a CT scan in 8-10 weeks please get PET scan in 8 weeks given reports of supraclavicular mediastinal lymphadenopathy and potential concern that this pneumonia might be mass

## 2022-06-06 NOTE — Telephone Encounter (Signed)
Left message for patient's wife to call back.  

## 2022-06-08 DIAGNOSIS — Z792 Long term (current) use of antibiotics: Secondary | ICD-10-CM | POA: Diagnosis not present

## 2022-06-08 DIAGNOSIS — R9431 Abnormal electrocardiogram [ECG] [EKG]: Secondary | ICD-10-CM | POA: Diagnosis not present

## 2022-06-08 DIAGNOSIS — E871 Hypo-osmolality and hyponatremia: Secondary | ICD-10-CM | POA: Diagnosis not present

## 2022-06-08 DIAGNOSIS — Z1152 Encounter for screening for COVID-19: Secondary | ICD-10-CM | POA: Diagnosis not present

## 2022-06-08 DIAGNOSIS — Z79899 Other long term (current) drug therapy: Secondary | ICD-10-CM | POA: Diagnosis not present

## 2022-06-08 DIAGNOSIS — J189 Pneumonia, unspecified organism: Secondary | ICD-10-CM | POA: Diagnosis not present

## 2022-06-08 DIAGNOSIS — J449 Chronic obstructive pulmonary disease, unspecified: Secondary | ICD-10-CM | POA: Diagnosis not present

## 2022-06-08 DIAGNOSIS — J439 Emphysema, unspecified: Secondary | ICD-10-CM | POA: Diagnosis not present

## 2022-06-08 DIAGNOSIS — E7800 Pure hypercholesterolemia, unspecified: Secondary | ICD-10-CM | POA: Diagnosis present

## 2022-06-08 DIAGNOSIS — R590 Localized enlarged lymph nodes: Secondary | ICD-10-CM | POA: Diagnosis not present

## 2022-06-08 DIAGNOSIS — I2510 Atherosclerotic heart disease of native coronary artery without angina pectoris: Secondary | ICD-10-CM | POA: Diagnosis present

## 2022-06-08 DIAGNOSIS — I4891 Unspecified atrial fibrillation: Secondary | ICD-10-CM | POA: Diagnosis not present

## 2022-06-08 DIAGNOSIS — J8410 Pulmonary fibrosis, unspecified: Secondary | ICD-10-CM | POA: Diagnosis not present

## 2022-06-08 DIAGNOSIS — E119 Type 2 diabetes mellitus without complications: Secondary | ICD-10-CM | POA: Diagnosis present

## 2022-06-08 DIAGNOSIS — I252 Old myocardial infarction: Secondary | ICD-10-CM | POA: Diagnosis not present

## 2022-06-08 DIAGNOSIS — I421 Obstructive hypertrophic cardiomyopathy: Secondary | ICD-10-CM | POA: Diagnosis not present

## 2022-06-08 DIAGNOSIS — I482 Chronic atrial fibrillation, unspecified: Secondary | ICD-10-CM | POA: Diagnosis not present

## 2022-06-08 DIAGNOSIS — I480 Paroxysmal atrial fibrillation: Secondary | ICD-10-CM | POA: Diagnosis not present

## 2022-06-08 DIAGNOSIS — I447 Left bundle-branch block, unspecified: Secondary | ICD-10-CM | POA: Diagnosis not present

## 2022-06-08 DIAGNOSIS — Z7901 Long term (current) use of anticoagulants: Secondary | ICD-10-CM | POA: Diagnosis not present

## 2022-06-08 DIAGNOSIS — R0602 Shortness of breath: Secondary | ICD-10-CM | POA: Diagnosis not present

## 2022-06-08 DIAGNOSIS — E785 Hyperlipidemia, unspecified: Secondary | ICD-10-CM | POA: Diagnosis present

## 2022-06-08 DIAGNOSIS — J9601 Acute respiratory failure with hypoxia: Secondary | ICD-10-CM | POA: Diagnosis not present

## 2022-06-08 DIAGNOSIS — G4700 Insomnia, unspecified: Secondary | ICD-10-CM | POA: Diagnosis not present

## 2022-06-08 DIAGNOSIS — I110 Hypertensive heart disease with heart failure: Secondary | ICD-10-CM | POA: Diagnosis present

## 2022-06-08 DIAGNOSIS — I509 Heart failure, unspecified: Secondary | ICD-10-CM | POA: Diagnosis not present

## 2022-06-08 DIAGNOSIS — J440 Chronic obstructive pulmonary disease with acute lower respiratory infection: Secondary | ICD-10-CM | POA: Diagnosis not present

## 2022-06-08 DIAGNOSIS — Z86711 Personal history of pulmonary embolism: Secondary | ICD-10-CM | POA: Diagnosis not present

## 2022-06-08 DIAGNOSIS — I444 Left anterior fascicular block: Secondary | ICD-10-CM | POA: Diagnosis not present

## 2022-06-10 NOTE — Telephone Encounter (Signed)
Attempted to call pt but line went directly to VM. Unable to leave VM as mailbox is full. Will try to call back later. 

## 2022-06-12 ENCOUNTER — Encounter: Payer: Self-pay | Admitting: Internal Medicine

## 2022-06-13 DIAGNOSIS — J189 Pneumonia, unspecified organism: Secondary | ICD-10-CM | POA: Diagnosis not present

## 2022-06-13 DIAGNOSIS — R339 Retention of urine, unspecified: Secondary | ICD-10-CM | POA: Diagnosis not present

## 2022-06-13 DIAGNOSIS — I421 Obstructive hypertrophic cardiomyopathy: Secondary | ICD-10-CM | POA: Diagnosis not present

## 2022-06-13 DIAGNOSIS — I509 Heart failure, unspecified: Secondary | ICD-10-CM | POA: Diagnosis not present

## 2022-06-13 DIAGNOSIS — R9431 Abnormal electrocardiogram [ECG] [EKG]: Secondary | ICD-10-CM | POA: Diagnosis not present

## 2022-06-13 DIAGNOSIS — R918 Other nonspecific abnormal finding of lung field: Secondary | ICD-10-CM | POA: Diagnosis not present

## 2022-06-13 DIAGNOSIS — I4891 Unspecified atrial fibrillation: Secondary | ICD-10-CM | POA: Diagnosis not present

## 2022-06-16 ENCOUNTER — Inpatient Hospital Stay: Payer: Medicare Other | Admitting: Nurse Practitioner

## 2022-06-16 DIAGNOSIS — J811 Chronic pulmonary edema: Secondary | ICD-10-CM | POA: Diagnosis not present

## 2022-06-16 DIAGNOSIS — J90 Pleural effusion, not elsewhere classified: Secondary | ICD-10-CM | POA: Diagnosis not present

## 2022-06-16 DIAGNOSIS — Z7952 Long term (current) use of systemic steroids: Secondary | ICD-10-CM | POA: Diagnosis not present

## 2022-06-16 DIAGNOSIS — I10 Essential (primary) hypertension: Secondary | ICD-10-CM | POA: Diagnosis not present

## 2022-06-16 DIAGNOSIS — R6520 Severe sepsis without septic shock: Secondary | ICD-10-CM | POA: Diagnosis present

## 2022-06-16 DIAGNOSIS — I421 Obstructive hypertrophic cardiomyopathy: Secondary | ICD-10-CM | POA: Diagnosis not present

## 2022-06-16 DIAGNOSIS — I4891 Unspecified atrial fibrillation: Secondary | ICD-10-CM | POA: Diagnosis present

## 2022-06-16 DIAGNOSIS — I160 Hypertensive urgency: Secondary | ICD-10-CM | POA: Diagnosis not present

## 2022-06-16 DIAGNOSIS — K219 Gastro-esophageal reflux disease without esophagitis: Secondary | ICD-10-CM | POA: Diagnosis not present

## 2022-06-16 DIAGNOSIS — J189 Pneumonia, unspecified organism: Secondary | ICD-10-CM | POA: Diagnosis not present

## 2022-06-16 DIAGNOSIS — R0602 Shortness of breath: Secondary | ICD-10-CM | POA: Diagnosis not present

## 2022-06-16 DIAGNOSIS — I700 Atherosclerosis of aorta: Secondary | ICD-10-CM | POA: Diagnosis not present

## 2022-06-16 DIAGNOSIS — I5033 Acute on chronic diastolic (congestive) heart failure: Secondary | ICD-10-CM | POA: Diagnosis not present

## 2022-06-16 DIAGNOSIS — J9690 Respiratory failure, unspecified, unspecified whether with hypoxia or hypercapnia: Secondary | ICD-10-CM | POA: Diagnosis not present

## 2022-06-16 DIAGNOSIS — I110 Hypertensive heart disease with heart failure: Secondary | ICD-10-CM | POA: Diagnosis not present

## 2022-06-16 DIAGNOSIS — E7800 Pure hypercholesterolemia, unspecified: Secondary | ICD-10-CM | POA: Diagnosis present

## 2022-06-16 DIAGNOSIS — R059 Cough, unspecified: Secondary | ICD-10-CM | POA: Diagnosis not present

## 2022-06-16 DIAGNOSIS — J440 Chronic obstructive pulmonary disease with acute lower respiratory infection: Secondary | ICD-10-CM | POA: Diagnosis present

## 2022-06-16 DIAGNOSIS — R339 Retention of urine, unspecified: Secondary | ICD-10-CM | POA: Diagnosis not present

## 2022-06-16 DIAGNOSIS — Z8711 Personal history of peptic ulcer disease: Secondary | ICD-10-CM | POA: Diagnosis not present

## 2022-06-16 DIAGNOSIS — Z7984 Long term (current) use of oral hypoglycemic drugs: Secondary | ICD-10-CM | POA: Diagnosis not present

## 2022-06-16 DIAGNOSIS — J8410 Pulmonary fibrosis, unspecified: Secondary | ICD-10-CM | POA: Diagnosis not present

## 2022-06-16 DIAGNOSIS — I2510 Atherosclerotic heart disease of native coronary artery without angina pectoris: Secondary | ICD-10-CM | POA: Diagnosis not present

## 2022-06-16 DIAGNOSIS — I252 Old myocardial infarction: Secondary | ICD-10-CM | POA: Diagnosis not present

## 2022-06-16 DIAGNOSIS — I509 Heart failure, unspecified: Secondary | ICD-10-CM | POA: Diagnosis not present

## 2022-06-16 DIAGNOSIS — M1990 Unspecified osteoarthritis, unspecified site: Secondary | ICD-10-CM | POA: Diagnosis present

## 2022-06-16 DIAGNOSIS — E119 Type 2 diabetes mellitus without complications: Secondary | ICD-10-CM | POA: Diagnosis not present

## 2022-06-16 DIAGNOSIS — Z9981 Dependence on supplemental oxygen: Secondary | ICD-10-CM | POA: Diagnosis not present

## 2022-06-16 DIAGNOSIS — J439 Emphysema, unspecified: Secondary | ICD-10-CM | POA: Diagnosis not present

## 2022-06-16 DIAGNOSIS — E871 Hypo-osmolality and hyponatremia: Secondary | ICD-10-CM | POA: Diagnosis not present

## 2022-06-16 DIAGNOSIS — A419 Sepsis, unspecified organism: Secondary | ICD-10-CM | POA: Diagnosis not present

## 2022-06-16 DIAGNOSIS — F32A Depression, unspecified: Secondary | ICD-10-CM | POA: Diagnosis not present

## 2022-06-16 DIAGNOSIS — J9601 Acute respiratory failure with hypoxia: Secondary | ICD-10-CM | POA: Diagnosis not present

## 2022-06-16 DIAGNOSIS — Z9989 Dependence on other enabling machines and devices: Secondary | ICD-10-CM | POA: Diagnosis not present

## 2022-06-16 DIAGNOSIS — Z86711 Personal history of pulmonary embolism: Secondary | ICD-10-CM | POA: Diagnosis not present

## 2022-06-16 DIAGNOSIS — J9620 Acute and chronic respiratory failure, unspecified whether with hypoxia or hypercapnia: Secondary | ICD-10-CM | POA: Diagnosis not present

## 2022-06-16 DIAGNOSIS — J9811 Atelectasis: Secondary | ICD-10-CM | POA: Diagnosis not present

## 2022-06-16 DIAGNOSIS — J441 Chronic obstructive pulmonary disease with (acute) exacerbation: Secondary | ICD-10-CM | POA: Diagnosis not present

## 2022-06-16 DIAGNOSIS — D649 Anemia, unspecified: Secondary | ICD-10-CM | POA: Diagnosis not present

## 2022-06-16 LAB — LAB REPORT - SCANNED: EGFR: 60

## 2022-06-16 NOTE — Progress Notes (Deleted)
@Patient  ID: Christian Patton, male    DOB: Dec 17, 1940, 82 y.o.   MRN: [id number]  No chief complaint on file.   Referring provider: Hurshel Party, NP  HPI:   TEST/EVENTS:   Allergies  Allergen Reactions   Metformin Diarrhea   Methocarbamol Other (See Comments)    Spouse states pt "acted crazy" after taking     Immunization History  Administered Date(s) Administered   Fluad Quad(high Dose 65+) 11/26/2018   Influenza-Unspecified 11/17/2016, 12/11/2017   Pneumococcal-Unspecified 12/12/2013    Past Medical History:  Diagnosis Date   Acute renal failure (HCC) 07/12/2014   Arthralgia of both knees 06/15/2014   Arthritis    Atherosclerotic heart disease of native coronary artery without angina pectoris 09-28-14   Overview:  Stented x 3; 2012;  cardiac cath clean arteries, 2014 Overview:  Overview:  Stented x 3; 2012;  cardiac cath clean arteries, 2014   Chronic anticoagulation Sep 28, 2014   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 34.9cm/sec               No mass, no ascites Labs- thrombocytopenia Overview:  Overview:  genotype 1a; Stage II fibrosis, Bx. 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   Hepatitis    C        "Has taken Harvoni"   History of other specified conditions presenting hazards to health 2014/09/28   Overview:  Overview:  perforated gastric ulcer   Hypertension    Hypertensive heart disease with heart failure (HCC) 2014-09-28   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 07/03/2014   Obstructive hypertrophic cardiomyopathy (HCC) 09/28/2014   Palpitation 01/27/2018   Preoperative cardiovascular examination 12/29/2016   Primary osteoarthritis of left knee 06/23/2014   Pulmonary embolism without acute cor pulmonale (HCC) 2014/09/28   Sudden cardiac death (HCC) 09-28-2014   Urine retention 07/12/2014    Tobacco History: Social History   Tobacco Use  Smoking Status Former   Packs/day: 1.50   Years: 24.00   Additional pack years: 0.00   Total pack years: 36.00   Types: Cigarettes   Start date: 76   Quit date: 1993   Years since quitting: 31.3   Passive exposure: Past  Smokeless Tobacco Former   Types: Chew   Counseling given: Not Answered   Outpatient Medications Prior to Visit  Medication Sig Dispense Refill   apixaban (ELIQUIS) 5 MG TABS tablet TAKE 1 TABLET(5 MG) BY MOUTH TWICE DAILY 180 tablet 1   Ascorbic Acid (VITAMIN C) 1000 MG tablet Take 1,000 mg by mouth daily.     azithromycin (ZITHROMAX) 250 MG tablet Take 250 mg by mouth daily.  Budeson-Glycopyrrol-Formoterol (BREZTRI AEROSPHERE) 160-9-4.8 MCG/ACT AERO Inhale 2 each into the lungs daily.     calcium gluconate 500 MG tablet Take 1 tablet by mouth daily.     cholecalciferol (VITAMIN D3) 25 MCG (1000 UNIT) tablet Take 1,000 Units by mouth daily.     hydrochlorothiazide (HYDRODIURIL) 25 MG tablet TAKE 1 TABLET BY MOUTH DAILY FOR BLOOD PRESSURE OVER 140/80 90 tablet 2   losartan (COZAAR) 25 MG tablet TAKE 1 TABLET(25 MG) BY MOUTH DAILY 90 tablet 2   MAGNESIUM GLYCINATE PLUS PO Take 200 mg by mouth daily.     Melatonin 10 MG TABS Take 1 tablet by mouth at bedtime.     metoprolol succinate (TOPROL-XL) 25 MG 24 hr tablet Take 12.5  mg by mouth daily.     Multiple Vitamins-Minerals (MENS 50+ MULTIVITAMIN) TABS Take 1 tablet by mouth daily.     Omega-3 Fatty Acids (FISH OIL) 600 MG CAPS Take 1 capsule by mouth daily.     predniSONE (DELTASONE) 10 MG tablet Take 10 mg by mouth daily with breakfast.     Quercetin 250 MG TABS Take 500 mg by mouth daily.     QUEtiapine (SEROQUEL) 25 MG tablet Take 50 mg by mouth at bedtime.      rosuvastatin (CRESTOR) 5 MG tablet Take 1 tablet (5 mg total) by mouth daily. 90 tablet 1   TRULICITY 0.75 MG/0.5ML SOPN Inject into the skin.     Zinc 50 MG TABS Take by mouth daily.     No facility-administered medications prior to visit.     Review of Systems:   Constitutional: No weight loss or gain, night sweats, fevers, chills, fatigue, or lassitude. HEENT: No headaches, difficulty swallowing, tooth/dental problems, or sore throat. No sneezing, itching, ear ache, nasal congestion, or post nasal drip CV:  No chest pain, orthopnea, PND, swelling in lower extremities, anasarca, dizziness, palpitations, syncope Resp: No shortness of breath with exertion or at rest. No excess mucus or change in color of mucus. No productive or non-productive. No hemoptysis. No wheezing.  No chest wall deformity GI:  No heartburn, indigestion, abdominal pain, nausea, vomiting, diarrhea, change in bowel habits, loss of appetite, bloody stools.  GU: No dysuria, change in color of urine, urgency or frequency.  No flank pain, no hematuria  Skin: No rash, lesions, ulcerations MSK:  No joint pain or swelling.  No decreased range of motion.  No back pain. Neuro: No dizziness or lightheadedness.  Psych: No depression or anxiety. Mood stable.     Physical Exam:  There were no vitals taken for this visit.  GEN: Pleasant, interactive, well-nourished/chronically-ill appearing/acutely-ill appearing/poorly-nourished/morbidly obese; in no acute distress.****** HEENT:  Normocephalic and atraumatic. EACs patent bilaterally.  TM pearly gray with present light reflex bilaterally. PERRLA. Sclera white. Nasal turbinates pink, moist and patent bilaterally. No rhinorrhea present. Oropharynx pink and moist, without exudate or edema. No lesions, ulcerations, or postnasal drip.  NECK:  Supple w/ fair ROM. No JVD present. Normal carotid impulses w/o bruits. Thyroid symmetrical with no goiter or nodules palpated. No lymphadenopathy.   CV: RRR, no m/r/g, no peripheral edema. Pulses intact, +2 bilaterally. No cyanosis, pallor or clubbing. PULMONARY:  Unlabored, regular breathing. Clear bilaterally A&P w/o wheezes/rales/rhonchi. No accessory muscle use.  GI: BS present and normoactive. Soft, non-tender to palpation. No organomegaly or masses detected. No CVA tenderness. MSK: No erythema, warmth or tenderness. Cap refil <2 sec all extrem. No deformities or joint swelling noted.  Neuro: A/Ox3. No focal deficits  noted.   Skin: Warm, no lesions or rashe Psych: Normal affect and behavior. Judgement and thought content appropriate.     Lab Results:  CBC    Component Value Date/Time   WBC 20.2 Repeated and verified X2. (HH) 06/03/2022 1536   RBC 4.45 06/03/2022 1536   HGB 12.6 (L) 06/03/2022 1536   HGB 13.9 12/21/2019 1455   HCT 38.2 (L) 06/03/2022 1536   HCT 40.4 12/21/2019 1455   PLT 306.0 06/03/2022 1536   PLT 234 12/21/2019 1455   MCV 85.9 06/03/2022 1536   MCV 87 12/21/2019 1455   MCH 29.9 12/21/2019 1455   MCH 28.9 07/14/2014 0400   MCHC 33.0 06/03/2022 1536   RDW 14.7 06/03/2022 1536   RDW 13.3 12/21/2019 1455   LYMPHSABS 0.4 (L) 06/03/2022 1536   MONOABS 0.2 06/03/2022 1536   EOSABS 0.0 06/03/2022 1536   BASOSABS 0.1 06/03/2022 1536    BMET    Component Value Date/Time   NA 128 (L) 06/03/2022 1536   NA 141 11/15/2020 1510   K 5.5 (H) 06/03/2022 1536   CL 94 (L) 06/03/2022 1536   CO2 26 06/03/2022 1536   GLUCOSE 457 (H) 06/03/2022 1536   BUN 31 (H) 06/03/2022 1536   BUN 26 11/15/2020 1510   CREATININE  1.00 06/03/2022 1536   CALCIUM 9.4 06/03/2022 1536   GFRNONAA 57 (L) 12/21/2019 1455   GFRAA 66 12/21/2019 1455    BNP    Component Value Date/Time   BNP 531.8 (H) 07/12/2014 0501     Imaging:  No results found.       Latest Ref Rng & Units 03/06/2021    9:50 AM  PFT Results  FVC-Pre L 3.42   FVC-Predicted Pre % 111   FVC-Post L 3.44   FVC-Predicted Post % 112   Pre FEV1/FVC % % 81   Post FEV1/FCV % % 83   FEV1-Pre L 2.78   FEV1-Predicted Pre % 130   FEV1-Post L 2.85   DLCO uncorrected ml/min/mmHg 11.98   DLCO UNC% % 59   DLCO corrected ml/min/mmHg 11.98   DLCO COR %Predicted % 59   DLVA Predicted % 62   TLC L 5.10   TLC % Predicted % 87   RV % Predicted % 69     No results found for: "NITRICOXIDE"      Assessment & Plan:   No problem-specific Assessment & Plan notes found for this encounter.   I spent *** minutes of dedicated to the care of this patient on the date of this encounter to include pre-visit review of records, face-to-face time with the patient discussing conditions above, post visit ordering of testing, clinical documentation with the electronic health record, making appropriate referrals as documented, and communicating necessary findings to members of the patients care team.  Noemi Chapel, NP 06/16/2022  Pt aware and understands NP's role.

## 2022-06-19 ENCOUNTER — Telehealth: Payer: Self-pay | Admitting: Cardiology

## 2022-06-19 NOTE — Telephone Encounter (Signed)
Called patient's wife and she reported that the patient had been admitted to [facility] with pneumonia and she would like for Dr. Dulce Sellar to come see the patient because of his A-fib and CHF. I explained that she had to request for cardiology to come see the patient  and that Dr. Dulce Sellar was out of the office so it would be another cardiologist from [facility]. Patient's wife stated that she would request for cardiology to come see the patient when she went back to the hospital later on tonight. Patient's wife had no further questions at this time.

## 2022-06-19 NOTE — Telephone Encounter (Signed)
Wife states patient has been admitted to [facility] with pneumonia, 4th Floor, Rm# [id number].  Wife stated his pulmonologist suggest patient's medication may need to be adjusted as patient also has afib and she wants Dr. Dulce Sellar to review.

## 2022-06-19 NOTE — Telephone Encounter (Signed)
Called pt and when trying to speak with pt, he stated that he was very hard of hearing. Asked if his spouse was at home and he said that she was not at the moment. Stated to pt to have spouse call the office once she returned home so we could go over test results with her and he verbalized understanding.  Will await a return call.

## 2022-06-19 NOTE — Telephone Encounter (Addendum)
Pt's spouse returned call and I let her know the results of pt's recent bloodwork.  While speaking with Eulah Citizen, she said that pt was currently in the hospital and said that he has been in the hospital since 5/3 at [facility].  Eulah Citizen said that pt had to be placed on BIPAP at the hospital. She stated that his WBC was even higher now at the hospital than what it was when labwork was done.  She said that she did not really know how long pt was going to be at the hospital but said that once pt is out of the hospital, they are going to have a pulmonologist down in [place] take over pt's care as it is hard on both her and pt to drive back and forth from [facility] to [facility] for pulmonary visits due to their age and both of their health. Nothing further needed.

## 2022-06-20 DIAGNOSIS — I10 Essential (primary) hypertension: Secondary | ICD-10-CM

## 2022-06-20 DIAGNOSIS — I5033 Acute on chronic diastolic (congestive) heart failure: Secondary | ICD-10-CM

## 2022-06-20 DIAGNOSIS — J8410 Pulmonary fibrosis, unspecified: Secondary | ICD-10-CM

## 2022-06-20 DIAGNOSIS — I4891 Unspecified atrial fibrillation: Secondary | ICD-10-CM

## 2022-06-24 ENCOUNTER — Telehealth: Payer: Self-pay | Admitting: Cardiology

## 2022-06-24 NOTE — Telephone Encounter (Signed)
Patient spouse is calling to update that the patient is doing better and went from 157lbs back to 154lbs. Patient's spouse would like to know if the swelling starts to increase again, what should they do. Please advise.

## 2022-06-24 NOTE — Telephone Encounter (Signed)
Pt's spouse called about him being prescribed Lasix 20mg  twice a day after being discharged from the hospital on 06/21/2022. She's like to know if medication can be increased due to her noticing he's gained 2 lbs his legs are seeping fluid. She's requesting a callback regarding this matter to see what can be done to assist him. Please advise

## 2022-06-24 NOTE — Telephone Encounter (Signed)
Left VM for call back.

## 2022-06-24 NOTE — Telephone Encounter (Signed)
Spoke with Eulah Citizen per DPR who states that pt had gained 3 pounds but after having a BM he lost the 3 pounds. Eulah Citizen would like to know what to do if he does gain 3 pounds in a day? BP has been 150/91.

## 2022-06-24 NOTE — Telephone Encounter (Signed)
Recommendations reviewed with Christian Patton per DPR as per Wallis Bamberg PA's note.  Pt verbalized understanding and had no additional questions.

## 2022-06-25 DIAGNOSIS — J4540 Moderate persistent asthma, uncomplicated: Secondary | ICD-10-CM | POA: Diagnosis not present

## 2022-06-25 DIAGNOSIS — J9611 Chronic respiratory failure with hypoxia: Secondary | ICD-10-CM | POA: Diagnosis not present

## 2022-06-25 DIAGNOSIS — R051 Acute cough: Secondary | ICD-10-CM | POA: Diagnosis not present

## 2022-06-25 DIAGNOSIS — Z8701 Personal history of pneumonia (recurrent): Secondary | ICD-10-CM | POA: Diagnosis not present

## 2022-06-25 NOTE — Telephone Encounter (Signed)
Please try again and close encounter. Thank you.

## 2022-06-27 DIAGNOSIS — R600 Localized edema: Secondary | ICD-10-CM | POA: Diagnosis not present

## 2022-06-27 DIAGNOSIS — I482 Chronic atrial fibrillation, unspecified: Secondary | ICD-10-CM | POA: Diagnosis not present

## 2022-06-27 DIAGNOSIS — Z79899 Other long term (current) drug therapy: Secondary | ICD-10-CM | POA: Diagnosis not present

## 2022-06-27 DIAGNOSIS — J189 Pneumonia, unspecified organism: Secondary | ICD-10-CM | POA: Diagnosis not present

## 2022-06-27 DIAGNOSIS — I5033 Acute on chronic diastolic (congestive) heart failure: Secondary | ICD-10-CM | POA: Diagnosis not present

## 2022-06-27 DIAGNOSIS — I422 Other hypertrophic cardiomyopathy: Secondary | ICD-10-CM | POA: Diagnosis not present

## 2022-06-27 DIAGNOSIS — D72829 Elevated white blood cell count, unspecified: Secondary | ICD-10-CM | POA: Diagnosis not present

## 2022-06-27 DIAGNOSIS — R053 Chronic cough: Secondary | ICD-10-CM | POA: Diagnosis not present

## 2022-06-27 DIAGNOSIS — I10 Essential (primary) hypertension: Secondary | ICD-10-CM | POA: Diagnosis not present

## 2022-06-27 DIAGNOSIS — E1165 Type 2 diabetes mellitus with hyperglycemia: Secondary | ICD-10-CM | POA: Diagnosis not present

## 2022-06-27 DIAGNOSIS — J9601 Acute respiratory failure with hypoxia: Secondary | ICD-10-CM | POA: Diagnosis not present

## 2022-06-28 NOTE — Progress Notes (Unsigned)
Cardiology Office Note:    Date:  06/30/2022   ID:  AH DOOD, DOB 06-09-1940, MRN [id number]  PCP:  Hurshel Party, NP  Cardiologist:  Norman Herrlich, MD    Referring MD: Hurshel Party, NP   2 weeks ASSESSMENT:    1. Persistent atrial fibrillation (HCC)   2. Chronic anticoagulation   3. Obstructive hypertrophic cardiomyopathy (HCC)   4. Hypertensive heart disease with heart failure (HCC)   5. Coronary artery disease of native artery of native heart with stable angina pectoris (HCC)   6. Mixed hyperlipidemia   7. Nonrheumatic mitral valve stenosis   8. Interstitial lung disease (HCC)    PLAN:    In order of problems listed above:  Rate controlled continue his beta-blocker and unguinal have him reduce his anticoagulant dose to 50% with his extensive ecchymosis Recheck echocardiogram no indication of obstructive hypertrophic cardiomyopathy with these individuals can Progress developed dilated cardiomyopathy Increased diuretic therapy changed to torsemide Stable CAD continue medical therapy having no anginal discomfort including lipid-lowering treatment Recheck his echocardiogram regarding his calcific mitral valve disease Being treated by pulmonary   Next appointment: 2 weeks   Medication Adjustments/Labs and Tests Ordered: Current medicines are reviewed at length with the patient today.  Concerns regarding medicines are outlined above.  Orders Placed This Encounter  Procedures   ECHOCARDIOGRAM COMPLETE   Meds ordered this encounter  Medications   torsemide (DEMADEX) 20 MG tablet    Sig: Take 1 tablet (20 mg total) by mouth 2 (two) times daily.    Dispense:  180 tablet    Refill:  3   apixaban (ELIQUIS) 2.5 MG TABS tablet    Sig: Take 1 tablet (2.5 mg total) by mouth 2 (two) times daily.    Dispense:  180 tablet    Refill:  3    Chief complaint I have multiple medical interactions and hospitalization   History of Present Illness:    Christian Patton is a 82 y.o. male  with a hx of permanent/chronic atrial fibrillation with anticoagulation obstructive hypertrophic cardiomyopathy hypertensive heart disease with heart failure hyperlipidemia calcific mitral valve disease with moderate stenosis mild aortic regurgitation and coronary artery disease with previous remote PCI last seen 08/01/2021.  Most recently has had hospitalizations and was found to have interstitial lung disease.  He was admitted at [facility] and treated with antibiotics bronchodilators and oxygen.  Compliance with diet, lifestyle and medications: Yes  Christian Patton is not doing well with high-dose steroids and developed marked peripheral edema he has an irritant cough he is sleeping sitting up he is short of breath ambulating and has had extensive ecchymosis of the abdominal wall extending to his groin No chest pain palpitation or syncope Past Medical History:  Diagnosis Date   Acute renal failure (HCC) 07/12/2014   Arthralgia of both knees 06/15/2014   Arthritis    Atherosclerotic heart disease of native coronary artery without angina pectoris 09/13/2014   Overview:  Stented x 3; 2012;  cardiac cath clean arteries, 2014 Overview:  Overview:  Stented x 3; 2012;  cardiac cath clean arteries, 2014   Chronic anticoagulation 09/13/2014   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 34.9cm/sec  No mass, no ascites Labs- thrombocytopenia Overview:  Overview:  genotype 1a; Stage II fibrosis, Bx. 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, 2005 Treated with IFN + RBV x 24 weeks, no response, 2002  continued additional 2 years-2005 Re-treated with Harvoni, 24 weeks, 2015   Sustained viral eradication  Risks-blood transfusion, UGI bleed, perforated duodenal ulcer, 1966   GERD (gastroesophageal reflux disease)    d/t barretts esphagus   Hepatitis    C       "Has taken Harvoni"   History of other specified conditions presenting hazards to health 09/20/14   Overview:  Overview:  perforated gastric ulcer   Hypertension    Hypertensive heart disease with heart failure (HCC) 09-20-2014   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 07/03/2014   Obstructive hypertrophic cardiomyopathy (HCC) 09-20-14   Palpitation 01/27/2018   Preoperative cardiovascular examination 12/29/2016   Primary osteoarthritis of left knee 06/23/2014   Pulmonary embolism without acute cor pulmonale (HCC) September 20, 2014   Sudden cardiac death (HCC) 09/20/14   Urine retention 07/12/2014    Past Surgical History:  Procedure Laterality Date   CARDIAC CATHETERIZATION     x 3    Last one was in 2011 @ [facility]   TONSILLECTOMY     TOTAL KNEE ARTHROPLASTY Left 07/03/2014   Procedure: LEFT TOTAL KNEE ARTHOPLASTY;  Surgeon: Dannielle Huh, MD;  Location: [facility] OR;  Service: Orthopedics;  Laterality: Left;    Current Medications: Current Meds  Medication Sig   albuterol (VENTOLIN HFA) 108 (90 Base) MCG/ACT inhaler Inhale 2 puffs into the lungs every 6 (six) hours as needed for wheezing or shortness of breath.   apixaban (ELIQUIS) 2.5 MG TABS tablet Take 1 tablet (2.5 mg total) by mouth 2 (two) times daily.   Ascorbic Acid (VITAMIN C) 1000 MG tablet Take 1,000 mg by mouth daily.   Budeson-Glycopyrrol-Formoterol (BREZTRI AEROSPHERE) 160-9-4.8 MCG/ACT AERO Inhale 2 each into the lungs daily.   calcium gluconate 500 MG tablet Take 1 tablet by mouth daily.   cholecalciferol (VITAMIN D3) 25 MCG (1000 UNIT) tablet Take 1,000 Units by mouth daily.   furosemide (LASIX) 20  MG tablet Take 20 mg by mouth 2 (two) times daily.   insulin lispro (HUMALOG) 100 UNIT/ML KwikPen Inject into the skin. Per sliding scale   ipratropium-albuterol (DUONEB) 0.5-2.5 (3) MG/3ML SOLN Inhale 3 mLs into the lungs every 6 (six) hours as needed (shortness of breath, wheezing).   LANTUS SOLOSTAR 100 UNIT/ML Solostar Pen Inject 20 Units into the skin at bedtime.   losartan (COZAAR) 25 MG tablet TAKE 1 TABLET(25 MG) BY MOUTH DAILY   MAGNESIUM GLYCINATE PLUS PO Take 200 mg by mouth daily.   Melatonin 10 MG TABS Take 1 tablet by mouth at bedtime.   metoprolol succinate (TOPROL-XL) 25 MG 24 hr tablet Take 12.5 mg by mouth daily.   Multiple Vitamins-Minerals (MENS 50+ MULTIVITAMIN) TABS Take 1 tablet by mouth daily.   potassium chloride (MICRO-K) 10 MEQ CR capsule Take 10 mEq by mouth daily.   predniSONE (DELTASONE) 10 MG tablet Take 10 mg by mouth daily with breakfast. Dose pack / as directed   Quercetin 250 MG  TABS Take 500 mg by mouth daily.   QUEtiapine (SEROQUEL) 25 MG tablet Take 50 mg by mouth at bedtime.    rosuvastatin (CRESTOR) 5 MG tablet Take 1 tablet (5 mg total) by mouth daily.   torsemide (DEMADEX) 20 MG tablet Take 1 tablet (20 mg total) by mouth 2 (two) times daily.   TRULICITY 0.75 MG/0.5ML SOPN Inject 0.75 mg into the skin once a week.   Zinc 50 MG TABS Take by mouth daily.   [DISCONTINUED] apixaban (ELIQUIS) 5 MG TABS tablet TAKE 1 TABLET(5 MG) BY MOUTH TWICE DAILY     Allergies:   Metformin and Methocarbamol   Social History   Socioeconomic History   Marital status: Married    Spouse name: Not on file   Number of children: Not on file   Years of education: Not on file   Highest education level: Not on file  Occupational History   Not on file  Tobacco Use   Smoking status: Former    Packs/day: 1.50    Years: 24.00    Additional pack years: 0.00    Total pack years: 36.00    Types: Cigarettes    Start date: 54    Quit date: 1993    Years since quitting:  31.4    Passive exposure: Past   Smokeless tobacco: Former    Types: Associate Professor Use: Never used  Substance and Sexual Activity   Alcohol use: No    Comment: quit 30 yrs ago   Drug use: No   Sexual activity: Not on file  Other Topics Concern   Not on file  Social History Narrative   Not on file   Social Determinants of Health   Financial Resource Strain: Not on file  Food Insecurity: Not on file  Transportation Needs: Not on file  Physical Activity: Not on file  Stress: Not on file  Social Connections: Not on file     Family History: The patient's family history includes Heart attack in his father and mother. ROS:   Please see the history of present illness.    All other systems reviewed and are negative.  EKGs/Labs/Other Studies Reviewed:    The following studies were reviewed today:  Cardiac Studies & Procedures       ECHOCARDIOGRAM  ECHOCARDIOGRAM COMPLETE 04/07/2019  Narrative ECHOCARDIOGRAM REPORT    Patient Name:   AJA GATTS Date of Exam: 04/06/2019 Medical Rec #:  [id number]    Height:       64.0 in Accession #:    [id number]   Weight:       168.0 lb Date of Birth:  February 02, 1941    BSA:          1.817 m Patient Age:    78 years     BP:           188/90 mmHg Patient Gender: M            HR:           62 bpm. Exam Location:  [facility]  Procedure: 2D Echo  Indications:    Obstructive hypertrophic cardiomyopathy (HCC) [I42.1]  History:        Patient has no prior history of Echocardiogram examinations, most recent 01/22/2017. Atherosclerotic heart disease of native coronary artery without angina pectoris, Signs/Symptoms:Hypertensive Heart Disease; Risk Factors:Diabetes.  Sonographer:    Louie Boston Referring Phys: [phone] Maloree Uplinger J Stephany Poorman  IMPRESSIONS   1. Left ventricular ejection fraction,  by estimation, is 60 to 65%. The left ventricle has normal function. The left ventricle has no regional wall motion abnormalities. There is severe  concentric left ventricular hypertrophy without SAM or LVOT gradient at rest. Left ventricular diastolic parameters are indeterminate. 2. Right ventricular systolic function is normal. The right ventricular size is normal. There is normal pulmonary artery systolic pressure. 3. Left atrial size was severely dilated. 4. Mean gradient of 10 mm Hg at HR 58 BPM. The mitral valve is degenerative. No evidence of mitral valve regurgitation. Mild to moderate mitral stenosis. 5. The aortic valve is tricuspid. Aortic valve regurgitation is mild. Mild aortic valve sclerosis is present, with no evidence of aortic valve stenosis. 6. The inferior vena cava is normal in size with greater than 50% respiratory variability, suggesting right atrial pressure of 3 mmHg.  FINDINGS Left Ventricle: Left ventricular ejection fraction, by estimation, is 60 to 65%. The left ventricle has normal function. The left ventricle has no regional wall motion abnormalities. The left ventricular internal cavity size was normal in size. There is severe concentric left ventricular hypertrophy. Left ventricular diastolic parameters are indeterminate with MAC.  Right Ventricle: The right ventricular size is normal. No increase in right ventricular wall thickness. Right ventricular systolic function is normal. There is normal pulmonary artery systolic pressure. The tricuspid regurgitant velocity is 2.58 m/s, and with an assumed right atrial pressure of 3 mmHg, the estimated right ventricular systolic pressure is 29.6 mmHg.  Left Atrium: Left atrial size was severely dilated.  Right Atrium: Right atrial size was normal in size.  Pericardium: There is no evidence of pericardial effusion.  Mitral Valve: Mean gradient of 10 mm Hg at HR 58 BPM. The mitral valve is degenerative in appearance. Normal mobility of the mitral valve leaflets. Moderate mitral annular calcification. No evidence of mitral valve regurgitation. Mild to moderate  mitral valve stenosis. MV peak gradient, 18.9 mmHg. The mean mitral valve gradient is 9.0 mmHg.  Tricuspid Valve: The tricuspid valve is normal in structure. Tricuspid valve regurgitation is mild . No evidence of tricuspid stenosis.  Aortic Valve: The aortic valve is tricuspid. Aortic valve regurgitation is mild. Mild aortic valve sclerosis is present, with no evidence of aortic valve stenosis.  Pulmonic Valve: The pulmonic valve was normal in structure. Pulmonic valve regurgitation is not visualized. No evidence of pulmonic stenosis.  Aorta: The aortic root and ascending aorta are structurally normal, with no evidence of dilitation.  Venous: The pulmonary veins were not well visualized. The inferior vena cava is normal in size with greater than 50% respiratory variability, suggesting right atrial pressure of 3 mmHg.  IAS/Shunts: No atrial level shunt detected by color flow Doppler.   LEFT VENTRICLE PLAX 2D LVIDd:         3.30 cm  Diastology LVIDs:         2.00 cm  LV e' lateral:   4.24 cm/s LV PW:         1.90 cm  LV E/e' lateral: 42.0 LV IVS:        2.40 cm  LV e' medial:    2.72 cm/s LVOT diam:     2.20 cm  LV E/e' medial:  65.4 LV SV:         127 LV SV Index:   70 LVOT Area:     3.80 cm   RIGHT VENTRICLE            IVC RV S prime:     8.59 cm/s  IVC diam: 1.70 cm TAPSE (M-mode): 2.2 cm  LEFT ATRIUM              Index       RIGHT ATRIUM           Index LA diam:        5.00 cm  2.75 cm/m  RA Area:     12.40 cm LA Vol (A2C):   165.0 ml 90.83 ml/m RA Volume:   26.00 ml  14.31 ml/m LA Vol (A4C):   83.7 ml  46.07 ml/m LA Biplane Vol: 123.0 ml 67.71 ml/m AORTIC VALVE LVOT Vmax:   128.00 cm/s LVOT Vmean:  92.700 cm/s LVOT VTI:    0.335 m  AORTA Ao Root diam: 3.25 cm Ao Asc diam:  3.50 cm  MITRAL VALVE                TRICUSPID VALVE MV Area (PHT): 1.67 cm     TR Peak grad:   26.6 mmHg MV Peak grad:  18.9 mmHg    TR Vmax:        258.00 cm/s MV Mean grad:  9.0  mmHg MV Vmax:       2.17 m/s     SHUNTS MV Vmean:      141.0 cm/s   Systemic VTI:  0.34 m MV Decel Time: 454 msec     Systemic Diam: 2.20 cm MV E velocity: 178.00 cm/s MV A velocity: 218.00 cm/s MV E/A ratio:  0.82  Norman Herrlich MD Electronically signed by Norman Herrlich MD Signature Date/Time: 04/07/2019/8:28:07 AM    Final    MONITORS  LONG TERM MONITOR (3-14 DAYS) 04/19/2021  Narrative ZIO monitor was performed for 3 days beginning 12/21/2019 to assess heart rate response to atrial fibrillation. The average heart rate is 60 bpm minimum 31 maximum 117 bpm. There were 5 pauses greater than 3 seconds the longest 3.3 seconds.  Ventricular ectopy was frequent with PVCs 645 couplets and 1 triplet.  They were 1 triggered and 1 diary event 1 of which was associated with frequent PVCs.   Conclusion atrial fibrillation with a relatively slow ventricular rate and frequent ventricular ectopy.            Physical Exam:    VS:  BP (!) 158/74 (BP Location: Right Arm, Patient Position: Sitting)   Pulse 82   Ht 5\' 4"  (1.626 m)   Wt 148 lb 9.6 oz (67.4 kg)   SpO2 96%   BMI 25.51 kg/m     Wt Readings from Last 3 Encounters:  06/30/22 148 lb 9.6 oz (67.4 kg)  06/03/22 151 lb 9.6 oz (68.8 kg)  08/01/21 150 lb 12.8 oz (68.4 kg)     GEN: He has lost a great deal of his muscle he is starting to appear frail well nourished, well developed in no acute distress HEENT: Normal NECK: No JVD; No carotid bruits LYMPHATICS: No lymphadenopathy CARDIAC: Irregular rate and rhythm no murmurs, rubs, gallops RESPIRATORY:  Clear to auscultation without rales, wheezing or rhonchi  ABDOMEN: Soft, non-tender, non-distended MUSCULOSKELETAL: He has marked bilateral lower extremity pitting to the knee edema; No deformity  SKIN: Warm and dry NEUROLOGIC:  Alert and oriented x 3 PSYCHIATRIC:  Normal affect    Signed, Norman Herrlich, MD  06/30/2022 11:46 AM    [facility]

## 2022-06-30 ENCOUNTER — Other Ambulatory Visit: Payer: Medicare Other

## 2022-06-30 ENCOUNTER — Other Ambulatory Visit: Payer: Self-pay

## 2022-06-30 ENCOUNTER — Ambulatory Visit: Payer: Medicare Other | Attending: Cardiology | Admitting: Cardiology

## 2022-06-30 ENCOUNTER — Encounter: Payer: Self-pay | Admitting: Cardiology

## 2022-06-30 VITALS — BP 158/74 | HR 82 | Ht 64.0 in | Wt 148.6 lb

## 2022-06-30 DIAGNOSIS — J849 Interstitial pulmonary disease, unspecified: Secondary | ICD-10-CM | POA: Diagnosis not present

## 2022-06-30 DIAGNOSIS — I4819 Other persistent atrial fibrillation: Secondary | ICD-10-CM | POA: Diagnosis not present

## 2022-06-30 DIAGNOSIS — I110 Hypertensive heart disease with heart failure: Secondary | ICD-10-CM

## 2022-06-30 DIAGNOSIS — I25118 Atherosclerotic heart disease of native coronary artery with other forms of angina pectoris: Secondary | ICD-10-CM | POA: Insufficient documentation

## 2022-06-30 DIAGNOSIS — I421 Obstructive hypertrophic cardiomyopathy: Secondary | ICD-10-CM | POA: Diagnosis not present

## 2022-06-30 DIAGNOSIS — I342 Nonrheumatic mitral (valve) stenosis: Secondary | ICD-10-CM

## 2022-06-30 DIAGNOSIS — E782 Mixed hyperlipidemia: Secondary | ICD-10-CM

## 2022-06-30 DIAGNOSIS — Z7901 Long term (current) use of anticoagulants: Secondary | ICD-10-CM | POA: Diagnosis not present

## 2022-06-30 MED ORDER — TORSEMIDE 20 MG PO TABS: 20.0000 mg | ORAL_TABLET | Freq: Two times a day (BID) | ORAL | 3 refills | Status: DC

## 2022-06-30 MED ORDER — APIXABAN 2.5 MG PO TABS: 2.5000 mg | ORAL_TABLET | Freq: Two times a day (BID) | ORAL | 3 refills | Status: DC

## 2022-06-30 NOTE — Patient Instructions (Signed)
Medication Instructions:  Your physician has recommended you make the following change in your medication:   START: Torsemide 20 mg twice daily START: Eliquis 2.5 mg twice daily  *If you need a refill on your cardiac medications before your next appointment, please call your pharmacy*   Lab Work: None If you have labs (blood work) drawn today and your tests are completely normal, you will receive your results only by: MyChart Message (if you have MyChart) OR A paper copy in the mail If you have any lab test that is abnormal or we need to change your treatment, we will call you to review the results.   Testing/Procedures: Your physician has requested that you have an echocardiogram. Echocardiography is a painless test that uses sound waves to create images of your heart. It provides your doctor with information about the size and shape of your heart and how well your heart's chambers and valves are working. This procedure takes approximately one hour. There are no restrictions for this procedure. Please do NOT wear cologne, perfume, aftershave, or lotions (deodorant is allowed). Please arrive 15 minutes prior to your appointment time.    Follow-Up: At [facility], you and your health needs are our priority.  As part of our continuing mission to provide you with exceptional heart care, we have created designated Provider Care Teams.  These Care Teams include your primary Cardiologist (physician) and Advanced Practice Providers (APPs -  Physician Assistants and Nurse Practitioners) who all work together to provide you with the care you need, when you need it.  We recommend signing up for the patient portal called "MyChart".  Sign up information is provided on this After Visit Summary.  MyChart is used to connect with patients for Virtual Visits (Telemedicine).  Patients are able to view lab/test results, encounter notes, upcoming appointments, etc.  Non-urgent messages can be sent  to your provider as well.   To learn more about what you can do with MyChart, go to [email or web address].    Your next appointment:   2 week(s)  Provider:   Norman Herrlich, MD    Other Instructions None

## 2022-07-02 ENCOUNTER — Other Ambulatory Visit: Payer: Self-pay | Admitting: Cardiology

## 2022-07-02 DIAGNOSIS — J4540 Moderate persistent asthma, uncomplicated: Secondary | ICD-10-CM | POA: Diagnosis not present

## 2022-07-02 DIAGNOSIS — R051 Acute cough: Secondary | ICD-10-CM | POA: Diagnosis not present

## 2022-07-02 DIAGNOSIS — Z8701 Personal history of pneumonia (recurrent): Secondary | ICD-10-CM | POA: Diagnosis not present

## 2022-07-02 DIAGNOSIS — J9611 Chronic respiratory failure with hypoxia: Secondary | ICD-10-CM | POA: Diagnosis not present

## 2022-07-02 MED ORDER — APIXABAN 2.5 MG PO TABS: 2.5000 mg | ORAL_TABLET | Freq: Two times a day (BID) | ORAL | 1 refills | Status: DC

## 2022-07-02 NOTE — Telephone Encounter (Signed)
Per Dr Dulce Sellar: "have him reduce his anticoagulant dose to 50% with his extensive ecchymosis." Ok to refill at lower dose.

## 2022-07-02 NOTE — Telephone Encounter (Signed)
Prescription refill request for Eliquis received. Indication: AF Last office visit: 06/30/22  Caryl Pina MD Scr: 1.00 on 06/03/22 Age: 82 Weight: 67.4kg  Based on above findings Eliquis 5mg  twice daily would be the appropriate dose.  Pt was just started on Eliquis 2.5mg  twice daily on 06/30/22 by Dr Dulce Sellar. Message sent to Pharmacist to advise on dose adjustment.

## 2022-07-02 NOTE — Telephone Encounter (Signed)
*  STAT* If patient is at the pharmacy, call can be transferred to refill team.   1. Which medications need to be refilled? (please list name of each medication and dose if known) apixaban (ELIQUIS) 2.5 MG TABS tablet   2. Which pharmacy/location (including street and city if local pharmacy) is medication to be sent to? [facility] - [place], [place] - [address] AT Portal to Registered [facility] Sites   3. Do they need a 30 day or 90 day supply? 90  

## 2022-07-02 NOTE — Telephone Encounter (Signed)
Eliquis refill approved at current dose of 2.5mg  twice daily.

## 2022-07-03 ENCOUNTER — Encounter: Payer: Self-pay | Admitting: Nurse Practitioner

## 2022-07-04 ENCOUNTER — Telehealth: Payer: Self-pay | Admitting: Cardiology

## 2022-07-04 ENCOUNTER — Other Ambulatory Visit: Payer: Self-pay

## 2022-07-04 NOTE — Telephone Encounter (Signed)
Called Caidon Moulds, the patient's wife and informed her of Dr. Hulen Shouts recommendation below:  "Restart metoprolol Stop losartan   Patient's wife verbalized understanding and had no further questions at this time.

## 2022-07-04 NOTE — Telephone Encounter (Signed)
Called Shadee Ridl, the patient's wife and she was concerned because the patient's blood pressure was running low. The latest blood pressure readings are:  This morning it is 96/63, yesterday it was 109/63, 108/64, 120/80, last night 128/77  Patient's blood pressure normally runs 140/85 and he takes Metoprolol and Losartan for his blood pressure medication. Eulah Citizen stated that she had held his blood pressure medication since 5/20 because his blood pressure was running low.

## 2022-07-04 NOTE — Telephone Encounter (Signed)
Pt c/o BP issue: STAT if pt c/o blurred vision, one-sided weakness or slurred speech  1. What are your last 5 BP readings?  This morning it is 96/63,  yesterday it was 109/63, 108/64, 120/80, last night 128/77  2. Are you having any other symptoms (ex. Dizziness, headache, blurred vision, passed out)?  No symptoms  3. What is your BP issue? Wife says his blood pressure is runnning low and she is concerned

## 2022-07-08 DIAGNOSIS — J4540 Moderate persistent asthma, uncomplicated: Secondary | ICD-10-CM | POA: Diagnosis not present

## 2022-07-08 DIAGNOSIS — J9611 Chronic respiratory failure with hypoxia: Secondary | ICD-10-CM | POA: Diagnosis not present

## 2022-07-08 DIAGNOSIS — R051 Acute cough: Secondary | ICD-10-CM | POA: Diagnosis not present

## 2022-07-08 DIAGNOSIS — Z8701 Personal history of pneumonia (recurrent): Secondary | ICD-10-CM | POA: Diagnosis not present

## 2022-07-11 DIAGNOSIS — E1165 Type 2 diabetes mellitus with hyperglycemia: Secondary | ICD-10-CM | POA: Diagnosis not present

## 2022-07-11 DIAGNOSIS — D72829 Elevated white blood cell count, unspecified: Secondary | ICD-10-CM | POA: Diagnosis not present

## 2022-07-11 DIAGNOSIS — R600 Localized edema: Secondary | ICD-10-CM | POA: Diagnosis not present

## 2022-07-11 DIAGNOSIS — J189 Pneumonia, unspecified organism: Secondary | ICD-10-CM | POA: Diagnosis not present

## 2022-07-11 DIAGNOSIS — I422 Other hypertrophic cardiomyopathy: Secondary | ICD-10-CM | POA: Diagnosis not present

## 2022-07-11 DIAGNOSIS — J9611 Chronic respiratory failure with hypoxia: Secondary | ICD-10-CM | POA: Diagnosis not present

## 2022-07-11 DIAGNOSIS — I5033 Acute on chronic diastolic (congestive) heart failure: Secondary | ICD-10-CM | POA: Diagnosis not present

## 2022-07-15 NOTE — Progress Notes (Unsigned)
Cardiology Office Note:    Date:  07/16/2022   ID:  Christian Patton, DOB Aug 19, 1940, MRN [id number]  PCP:  Hurshel Party, NP  Cardiologist:  Norman Herrlich, MD    Referring MD: Hurshel Party, NP    ASSESSMENT:    1. Hypertensive heart disease with heart failure (HCC)   2. Obstructive hypertrophic cardiomyopathy (HCC)   3. Persistent atrial fibrillation (HCC)   4. Chronic anticoagulation   5. Interstitial lung disease (HCC)    PLAN:    In order of problems listed above:  He is improved but remains edematous fluid overloaded we will increase his loop diuretic knowing that he does not have left ventricular outflow track obstruction.  He will return for a full echocardiogram to define better but he appears to have severe calcific mitral stenosis in general these individuals do not do well with surgical intervention we will have to treat him to the best of our ability medically and reserve any consideration for surgical intervention until he has been treated and is also his lung problem has been better defined and improved. Fortunately he has no left ventricular outflow tract obstruction He is rate controlled continue his beta-blocker and his anticoagulant Unfortunately he has a venous ulcer from ill fitting shoe superimposed on his lower extremity edema and diabetes I will refer him to our local surgical specialist for Una compression wrap Following the local pulmonary physician and my office call to see if we could facilitate that CT scan.  His wife tells me his sats are improved.   Next appointment: 6 weeks   Medication Adjustments/Labs and Tests Ordered: Current medicines are reviewed at length with the patient today.  Concerns regarding medicines are outlined above.  No orders of the defined types were placed in this encounter.  No orders of the defined types were placed in this encounter.   Chief Complaint  Patient presents with   Follow-up   Congestive Heart Failure   Atrial  Fibrillation   Cardiomyopathy   Coronary Artery Disease    History of Present Illness:    Christian Patton is a 82 y.o. 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  Compliance with diet, lifestyle and medications: Yes  Christian Patton is now being seen by Dr. Blenda Nicely pulmonary in [place]. His sats are better he runs 94 to 95% at rest but any activity he desaturates to 90% or less He has a CT of the chest ordered at [facility] but they have never received a call it is not scheduled we will see if we can facilitate that I have placed him on a diuretic with fluid overload last visit he is improved but unfortunately has a venous ulcer on the left foot both the dorsum above the heel from wearing shoes that were loosefitting with his edema Will refer him to Dr. Hardin Negus for an overwrap Today we will do a quick look echo in the office due to his history of hypertrophic cardiomyopathy and heart failure Otherwise he certainly is improved he is more active more comfortable not having orthopnea chest pain palpitation or syncope  [name] had a quick focused echocardiogram in the office he has severe LVH ejection fraction  is normal.  Aortic valve is sclerotic he has mild stenosis.  He has no left ventricular outflow tract obstruction.  What he does have is dense mitral annular calcification mild MR and has mitral stenosis that looks severe intermediate and 15 mmHg.  He is scheduled to return for full echocardiogram and increase his diuretic as his heart failure is clearly improved.  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 34.9cm/sec               No mass, no ascites Labs- thrombocytopenia Overview:  Overview:  genotype 1a; Stage II fibrosis, Bx. 2005; Tx'd with Harvoni x 24 weeks, 2015   Coronary artery disease    Diabetes mellitus without complication (HCC)    TYPE 2   Essential hypertension 2014/10/09   Overview:  on meds Overview:  Overview:  on meds   Exposure to hepatitis C 12/26/2016   Overview:  Diagnosed 1995 Genotype 1a Liver Bx- Stage II fibrosis, 2005 Treated with IFN + RBV x 24 weeks, no response, 2002  continued additional 2 years-2005 Re-treated with Harvoni, 24 weeks, 2015   Sustained viral eradication  Risks-blood transfusion, UGI bleed, perforated duodenal ulcer, 1966   GERD (gastroesophageal reflux disease)    d/t barretts esphagus   Hepatitis    C       "Has taken Harvoni"   History of other specified conditions presenting hazards to health 2014-10-09   Overview:  Overview:  perforated gastric ulcer   Hypertension    Hypertensive heart disease with heart failure (HCC) 10/09/14   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 07/03/2014   Obstructive hypertrophic cardiomyopathy (HCC) 10/09/2014   Palpitation 01/27/2018   Preoperative cardiovascular examination 12/29/2016   Primary osteoarthritis of left knee 06/23/2014    Pulmonary embolism without acute cor pulmonale (HCC) 09-Oct-2014   Sudden cardiac death (HCC) 10/09/2014   Urine retention 07/12/2014    Past Surgical History:  Procedure Laterality Date   CARDIAC CATHETERIZATION     x 3    Last one was in 2011 @ [facility]   TONSILLECTOMY     TOTAL KNEE ARTHROPLASTY Left 07/03/2014   Procedure: LEFT TOTAL KNEE ARTHOPLASTY;  Surgeon: Dannielle Huh, MD;  Location: [facility] OR;  Service: Orthopedics;  Laterality: Left;    Current Medications: Current Meds  Medication Sig   apixaban (ELIQUIS) 2.5 MG TABS tablet Take 1 tablet (2.5 mg total) by mouth 2 (two) times daily.   Ascorbic Acid (VITAMIN C) 1000 MG tablet Take 1,000 mg by mouth daily.   Budeson-Glycopyrrol-Formoterol (BREZTRI AEROSPHERE) 160-9-4.8 MCG/ACT AERO Inhale 2 each into the lungs daily.   calcium gluconate 500 MG tablet Take 1 tablet by mouth daily.   cholecalciferol (VITAMIN D3) 25 MCG (1000 UNIT) tablet Take 1,000 Units by mouth daily.  insulin lispro (HUMALOG) 100 UNIT/ML KwikPen Inject into the skin. Per sliding scale   ipratropium-albuterol (DUONEB) 0.5-2.5 (3) MG/3ML SOLN Inhale 3 mLs into the lungs every 6 (six) hours as needed (shortness of breath, wheezing).   LANTUS SOLOSTAR 100 UNIT/ML Solostar Pen Inject 20 Units into the skin at bedtime.   MAGNESIUM GLYCINATE PLUS PO Take 200 mg by mouth daily.   Melatonin 10 MG TABS Take 1 tablet by mouth at bedtime.   metoprolol succinate (TOPROL-XL) 25 MG 24 hr tablet Take 12.5 mg by mouth daily.   Multiple Vitamins-Minerals (MENS 50+ MULTIVITAMIN) TABS Take 1 tablet by mouth daily.   Quercetin 250 MG TABS Take 500 mg by mouth daily.   QUEtiapine (SEROQUEL) 25 MG tablet Take 50 mg by mouth at bedtime.    rosuvastatin (CRESTOR) 5 MG tablet Take 1 tablet (5 mg total) by mouth daily.   torsemide (DEMADEX) 20 MG tablet Take 1 tablet (20 mg total) by mouth 2 (two) times daily.   TRULICITY 0.75 MG/0.5ML SOPN Inject 0.75 mg into the skin once a week.    Zinc 50 MG TABS Take by mouth daily.     Allergies:   Metformin and Methocarbamol   Social History   Socioeconomic History   Marital status: Married    Spouse name: Not on file   Number of children: Not on file   Years of education: Not on file   Highest education level: Not on file  Occupational History   Not on file  Tobacco Use   Smoking status: Former    Packs/day: 1.50    Years: 24.00    Additional pack years: 0.00    Total pack years: 36.00    Types: Cigarettes    Start date: 40    Quit date: 1993    Years since quitting: 31.4    Passive exposure: Past   Smokeless tobacco: Former    Types: Associate Professor Use: Never used  Substance and Sexual Activity   Alcohol use: No    Comment: quit 30 yrs ago   Drug use: No   Sexual activity: Not on file  Other Topics Concern   Not on file  Social History Narrative   Not on file   Social Determinants of Health   Financial Resource Strain: Not on file  Food Insecurity: Not on file  Transportation Needs: Not on file  Physical Activity: Not on file  Stress: Not on file  Social Connections: Not on file     Family History: The patient's family history includes Heart attack in his father and mother. ROS:   Please see the history of present illness.    All other systems reviewed and are negative.  EKGs/Labs/Other Studies Reviewed:    The following studies were reviewed today:  Cardiac Studies & Procedures       ECHOCARDIOGRAM  ECHOCARDIOGRAM COMPLETE 04/07/2019  Narrative ECHOCARDIOGRAM REPORT    Patient Name:   DAKKOTA SPICKARD Date of Exam: 04/06/2019 Medical Rec #:  [id number]    Height:       64.0 in Accession #:    [id number]   Weight:       168.0 lb Date of Birth:  [birth year]    BSA:          1.817 m Patient Age:    78 years     BP:           188/90 mmHg Patient Gender: M  HR:           62 bpm. Exam Location:  [place]  Procedure: 2D Echo  Indications:    Obstructive  hypertrophic cardiomyopathy (HCC) [I42.1]  History:        Patient has no prior history of Echocardiogram examinations, most recent 01/22/2017. Atherosclerotic heart disease of native coronary artery without angina pectoris, Signs/Symptoms:Hypertensive Heart Disease; Risk Factors:Diabetes.  Sonographer:    Louie Boston Referring Phys: [phone] Dashun Borre J Frances Ambrosino  IMPRESSIONS   1. Left ventricular ejection fraction, by estimation, is 60 to 65%. The left ventricle has normal function. The left ventricle has no regional wall motion abnormalities. There is severe concentric left ventricular hypertrophy without SAM or LVOT gradient at rest. Left ventricular diastolic parameters are indeterminate. 2. Right ventricular systolic function is normal. The right ventricular size is normal. There is normal pulmonary artery systolic pressure. 3. Left atrial size was severely dilated. 4. Mean gradient of 10 mm Hg at HR 58 BPM. The mitral valve is degenerative. No evidence of mitral valve regurgitation. Mild to moderate mitral stenosis. 5. The aortic valve is tricuspid. Aortic valve regurgitation is mild. Mild aortic valve sclerosis is present, with no evidence of aortic valve stenosis. 6. The inferior vena cava is normal in size with greater than 50% respiratory variability, suggesting right atrial pressure of 3 mmHg.  FINDINGS Left Ventricle: Left ventricular ejection fraction, by estimation, is 60 to 65%. The left ventricle has normal function. The left ventricle has no regional wall motion abnormalities. The left ventricular internal cavity size was normal in size. There is severe concentric left ventricular hypertrophy. Left ventricular diastolic parameters are indeterminate with MAC.  Right Ventricle: The right ventricular size is normal. No increase in right ventricular wall thickness. Right ventricular systolic function is normal. There is normal pulmonary artery systolic pressure. The tricuspid  regurgitant velocity is 2.58 m/s, and with an assumed right atrial pressure of 3 mmHg, the estimated right ventricular systolic pressure is 29.6 mmHg.  Left Atrium: Left atrial size was severely dilated.  Right Atrium: Right atrial size was normal in size.  Pericardium: There is no evidence of pericardial effusion.  Mitral Valve: Mean gradient of 10 mm Hg at HR 58 BPM. The mitral valve is degenerative in appearance. Normal mobility of the mitral valve leaflets. Moderate mitral annular calcification. No evidence of mitral valve regurgitation. Mild to moderate mitral valve stenosis. MV peak gradient, 18.9 mmHg. The mean mitral valve gradient is 9.0 mmHg.  Tricuspid Valve: The tricuspid valve is normal in structure. Tricuspid valve regurgitation is mild . No evidence of tricuspid stenosis.  Aortic Valve: The aortic valve is tricuspid. Aortic valve regurgitation is mild. Mild aortic valve sclerosis is present, with no evidence of aortic valve stenosis.  Pulmonic Valve: The pulmonic valve was normal in structure. Pulmonic valve regurgitation is not visualized. No evidence of pulmonic stenosis.  Aorta: The aortic root and ascending aorta are structurally normal, with no evidence of dilitation.  Venous: The pulmonary veins were not well visualized. The inferior vena cava is normal in size with greater than 50% respiratory variability, suggesting right atrial pressure of 3 mmHg.  IAS/Shunts: No atrial level shunt detected by color flow Doppler.   LEFT VENTRICLE PLAX 2D LVIDd:         3.30 cm  Diastology LVIDs:         2.00 cm  LV e' lateral:   4.24 cm/s LV PW:         1.90 cm  LV E/e' lateral: 42.0 LV IVS:        2.40 cm  LV e' medial:    2.72 cm/s LVOT diam:     2.20 cm  LV E/e' medial:  65.4 LV SV:         127 LV SV Index:   70 LVOT Area:     3.80 cm   RIGHT VENTRICLE            IVC RV S prime:     8.59 cm/s  IVC diam: 1.70 cm TAPSE (M-mode): 2.2 cm  LEFT ATRIUM              Index        RIGHT ATRIUM           Index LA diam:        5.00 cm  2.75 cm/m  RA Area:     12.40 cm LA Vol (A2C):   165.0 ml 90.83 ml/m RA Volume:   26.00 ml  14.31 ml/m LA Vol (A4C):   83.7 ml  46.07 ml/m LA Biplane Vol: 123.0 ml 67.71 ml/m AORTIC VALVE LVOT Vmax:   128.00 cm/s LVOT Vmean:  92.700 cm/s LVOT VTI:    0.335 m  AORTA Ao Root diam: 3.25 cm Ao Asc diam:  3.50 cm  MITRAL VALVE                TRICUSPID VALVE MV Area (PHT): 1.67 cm     TR Peak grad:   26.6 mmHg MV Peak grad:  18.9 mmHg    TR Vmax:        258.00 cm/s MV Mean grad:  9.0 mmHg MV Vmax:       2.17 m/s     SHUNTS MV Vmean:      141.0 cm/s   Systemic VTI:  0.34 m MV Decel Time: 454 msec     Systemic Diam: 2.20 cm MV E velocity: 178.00 cm/s MV A velocity: 218.00 cm/s MV E/A ratio:  0.82  Norman Herrlich MD Electronically signed by Norman Herrlich MD Signature Date/Time: 04/07/2019/8:28:07 AM    Final    MONITORS  LONG TERM MONITOR (3-14 DAYS) 04/19/2021  Narrative ZIO monitor was performed for 3 days beginning 12/21/2019 to assess heart rate response to atrial fibrillation. The average heart rate is 60 bpm minimum 31 maximum 117 bpm. There were 5 pauses greater than 3 seconds the longest 3.3 seconds.  Ventricular ectopy was frequent with PVCs 645 couplets and 1 triplet.  They were 1 triggered and 1 diary event 1 of which was associated with frequent PVCs.   Conclusion atrial fibrillation with a relatively slow ventricular rate and frequent ventricular ectopy.             Recent Labs: 06/03/2022: ALT 53; BUN 31; Creatinine, Ser 1.00; Hemoglobin 12.6; Platelets 306.0; Potassium 5.5; Pro B Natriuretic peptide (BNP) 336.0; Sodium 128  Recent Lipid Panel No results found for: "CHOL", "TRIG", "HDL", "CHOLHDL", "VLDL", "LDLCALC", "LDLDIRECT"  Physical Exam:    VS:  BP 126/60 (BP Location: Left Arm, Patient Position: Sitting, Cuff Size: Normal)   Pulse 92   Wt 136 lb 9.6 oz (62 kg)   SpO2 90%   BMI  23.45 kg/m     Wt Readings from Last 3 Encounters:  07/16/22 136 lb 9.6 oz (62 kg)  06/30/22 148 lb 9.6 oz (67.4 kg)  06/03/22 151 lb 9.6 oz (68.8 kg)     GEN: He certainly has lost a  great deal of weight but he looks improved since last visit he is not short of breath at rest  Well nourished, well developed in no acute distress HEENT: Normal NECK: No JVD; No carotid bruits LYMPHATICS: No lymphadenopathy CARDIAC: Irregular rate and rhythm grade 1/6 to 2/6 murmur I am unsure whether it is outflow tract obstruction or a moreRESPIRATORY:  Clear to auscultation without rales, wheezing or rhonchi  ABDOMEN: Soft, non-tender, non-distended MUSCULOSKELETAL:   he has below the knee bilateral edema predominantly the ankle and dorsum of foot greater than 4+ markedly improved from last visit no longer has presacral edema; No deformity  SKIN: Warm and dry NEUROLOGIC:  Alert and oriented x 3 PSYCHIATRIC:  Normal affect    Signed, Norman Herrlich, MD  07/16/2022 3:29 PM    [facility]

## 2022-07-16 ENCOUNTER — Encounter: Payer: Self-pay | Admitting: Cardiology

## 2022-07-16 ENCOUNTER — Ambulatory Visit: Payer: Medicare Other | Attending: Cardiology | Admitting: Cardiology

## 2022-07-16 ENCOUNTER — Other Ambulatory Visit: Payer: Self-pay | Admitting: Cardiology

## 2022-07-16 VITALS — BP 126/60 | HR 92 | Wt 136.6 lb

## 2022-07-16 DIAGNOSIS — I421 Obstructive hypertrophic cardiomyopathy: Secondary | ICD-10-CM | POA: Diagnosis not present

## 2022-07-16 DIAGNOSIS — Z7901 Long term (current) use of anticoagulants: Secondary | ICD-10-CM | POA: Insufficient documentation

## 2022-07-16 DIAGNOSIS — I4819 Other persistent atrial fibrillation: Secondary | ICD-10-CM | POA: Diagnosis not present

## 2022-07-16 DIAGNOSIS — I110 Hypertensive heart disease with heart failure: Secondary | ICD-10-CM | POA: Insufficient documentation

## 2022-07-16 DIAGNOSIS — J849 Interstitial pulmonary disease, unspecified: Secondary | ICD-10-CM | POA: Diagnosis not present

## 2022-07-16 MED ORDER — FUROSEMIDE 20 MG PO TABS: 20.0000 mg | ORAL_TABLET | Freq: Three times a day (TID) | ORAL | 3 refills | Status: DC

## 2022-07-16 MED ORDER — TORSEMIDE 20 MG PO TABS: 20.0000 mg | ORAL_TABLET | Freq: Three times a day (TID) | ORAL | 3 refills | Status: DC

## 2022-07-16 NOTE — Patient Instructions (Addendum)
Medication Instructions:  Your physician has recommended you make the following change in your medication:   START: Torsemide 20 mg three times daily on Monday, Wednesday and Friday  *If you need a refill on your cardiac medications before your next appointment, please call your pharmacy*   Lab Work: None If you have labs (blood work) drawn today and your tests are completely normal, you will receive your results only by: MyChart Message (if you have MyChart) OR A paper copy in the mail If you have any lab test that is abnormal or we need to change your treatment, we will call you to review the results.   Testing/Procedures: None   Follow-Up: At [facility], you and your health needs are our priority.  As part of our continuing mission to provide you with exceptional heart care, we have created designated Provider Care Teams.  These Care Teams include your primary Cardiologist (physician) and Advanced Practice Providers (APPs -  Physician Assistants and Nurse Practitioners) who all work together to provide you with the care you need, when you need it.  We recommend signing up for the patient portal called "MyChart".  Sign up information is provided on this After Visit Summary.  MyChart is used to connect with patients for Virtual Visits (Telemedicine).  Patients are able to view lab/test results, encounter notes, upcoming appointments, etc.  Non-urgent messages can be sent to your provider as well.   To learn more about what you can do with MyChart, go to [email or web address].    Your next appointment:   6 week(s)  Provider:   Norman Herrlich, MD    Other Instructions None

## 2022-07-17 DIAGNOSIS — Z9981 Dependence on supplemental oxygen: Secondary | ICD-10-CM | POA: Diagnosis not present

## 2022-07-17 DIAGNOSIS — H9190 Unspecified hearing loss, unspecified ear: Secondary | ICD-10-CM | POA: Diagnosis not present

## 2022-07-17 DIAGNOSIS — Z86711 Personal history of pulmonary embolism: Secondary | ICD-10-CM | POA: Diagnosis not present

## 2022-07-17 DIAGNOSIS — J189 Pneumonia, unspecified organism: Secondary | ICD-10-CM | POA: Diagnosis not present

## 2022-07-17 DIAGNOSIS — D649 Anemia, unspecified: Secondary | ICD-10-CM | POA: Diagnosis not present

## 2022-07-17 DIAGNOSIS — Z794 Long term (current) use of insulin: Secondary | ICD-10-CM | POA: Diagnosis not present

## 2022-07-17 DIAGNOSIS — Z7901 Long term (current) use of anticoagulants: Secondary | ICD-10-CM | POA: Diagnosis not present

## 2022-07-17 DIAGNOSIS — I422 Other hypertrophic cardiomyopathy: Secondary | ICD-10-CM | POA: Diagnosis not present

## 2022-07-17 DIAGNOSIS — I482 Chronic atrial fibrillation, unspecified: Secondary | ICD-10-CM | POA: Diagnosis not present

## 2022-07-17 DIAGNOSIS — R351 Nocturia: Secondary | ICD-10-CM | POA: Diagnosis not present

## 2022-07-17 DIAGNOSIS — F5104 Psychophysiologic insomnia: Secondary | ICD-10-CM | POA: Diagnosis not present

## 2022-07-17 DIAGNOSIS — J440 Chronic obstructive pulmonary disease with acute lower respiratory infection: Secondary | ICD-10-CM | POA: Diagnosis not present

## 2022-07-17 DIAGNOSIS — J9611 Chronic respiratory failure with hypoxia: Secondary | ICD-10-CM | POA: Diagnosis not present

## 2022-07-17 DIAGNOSIS — N401 Enlarged prostate with lower urinary tract symptoms: Secondary | ICD-10-CM | POA: Diagnosis not present

## 2022-07-17 DIAGNOSIS — I5033 Acute on chronic diastolic (congestive) heart failure: Secondary | ICD-10-CM | POA: Diagnosis not present

## 2022-07-17 DIAGNOSIS — I2510 Atherosclerotic heart disease of native coronary artery without angina pectoris: Secondary | ICD-10-CM | POA: Diagnosis not present

## 2022-07-17 DIAGNOSIS — E1165 Type 2 diabetes mellitus with hyperglycemia: Secondary | ICD-10-CM | POA: Diagnosis not present

## 2022-07-17 DIAGNOSIS — I110 Hypertensive heart disease with heart failure: Secondary | ICD-10-CM | POA: Diagnosis not present

## 2022-07-17 DIAGNOSIS — Z951 Presence of aortocoronary bypass graft: Secondary | ICD-10-CM | POA: Diagnosis not present

## 2022-07-17 DIAGNOSIS — D72829 Elevated white blood cell count, unspecified: Secondary | ICD-10-CM | POA: Diagnosis not present

## 2022-07-17 DIAGNOSIS — Z7951 Long term (current) use of inhaled steroids: Secondary | ICD-10-CM | POA: Diagnosis not present

## 2022-07-17 DIAGNOSIS — Z7985 Long-term (current) use of injectable non-insulin antidiabetic drugs: Secondary | ICD-10-CM | POA: Diagnosis not present

## 2022-07-17 DIAGNOSIS — J849 Interstitial pulmonary disease, unspecified: Secondary | ICD-10-CM | POA: Diagnosis not present

## 2022-07-17 DIAGNOSIS — E785 Hyperlipidemia, unspecified: Secondary | ICD-10-CM | POA: Diagnosis not present

## 2022-07-17 NOTE — Telephone Encounter (Signed)
Furosemide Rx request refused, med was d/c 07/16/22

## 2022-07-18 ENCOUNTER — Other Ambulatory Visit: Payer: Self-pay

## 2022-07-18 ENCOUNTER — Encounter: Payer: Self-pay | Admitting: Internal Medicine

## 2022-07-18 DIAGNOSIS — R590 Localized enlarged lymph nodes: Secondary | ICD-10-CM

## 2022-07-18 DIAGNOSIS — J84112 Idiopathic pulmonary fibrosis: Secondary | ICD-10-CM

## 2022-07-18 DIAGNOSIS — R599 Enlarged lymph nodes, unspecified: Secondary | ICD-10-CM

## 2022-07-18 DIAGNOSIS — R591 Generalized enlarged lymph nodes: Secondary | ICD-10-CM

## 2022-07-21 ENCOUNTER — Other Ambulatory Visit: Payer: Self-pay

## 2022-07-21 ENCOUNTER — Telehealth: Payer: Self-pay | Admitting: Internal Medicine

## 2022-07-21 DIAGNOSIS — R918 Other nonspecific abnormal finding of lung field: Secondary | ICD-10-CM

## 2022-07-21 DIAGNOSIS — J440 Chronic obstructive pulmonary disease with acute lower respiratory infection: Secondary | ICD-10-CM | POA: Diagnosis not present

## 2022-07-21 DIAGNOSIS — E1165 Type 2 diabetes mellitus with hyperglycemia: Secondary | ICD-10-CM | POA: Diagnosis not present

## 2022-07-21 DIAGNOSIS — R590 Localized enlarged lymph nodes: Secondary | ICD-10-CM

## 2022-07-21 DIAGNOSIS — J984 Other disorders of lung: Secondary | ICD-10-CM | POA: Diagnosis not present

## 2022-07-21 DIAGNOSIS — I5033 Acute on chronic diastolic (congestive) heart failure: Secondary | ICD-10-CM | POA: Diagnosis not present

## 2022-07-21 DIAGNOSIS — J189 Pneumonia, unspecified organism: Secondary | ICD-10-CM | POA: Diagnosis not present

## 2022-07-21 DIAGNOSIS — I110 Hypertensive heart disease with heart failure: Secondary | ICD-10-CM | POA: Diagnosis not present

## 2022-07-21 DIAGNOSIS — I482 Chronic atrial fibrillation, unspecified: Secondary | ICD-10-CM | POA: Diagnosis not present

## 2022-07-21 NOTE — Telephone Encounter (Signed)
FYI - PET order was placed.  I called pt to give him appt info.  Wife states she is no longer able to drive to [facility] for pt to see Dr Marchelle Gearing.  She states she hates it has come to that but she doesn't feel comfortable driving in [place].  She states pt used to do all the driving and he is no longer able to do it.  She states he is now seeing Dr Blenda Nicely in [place].  I gave her the phone # to medical records so she can have pt's records sent to [facility] and cancelled PET appt.  I told her I would make Dr Marchelle Gearing aware of their situation.

## 2022-07-22 NOTE — Telephone Encounter (Signed)
Sorry to hear this. However, let him know that if there is a clinical trial they are interested transport is covered for that and we can send Benedetto Goad. But he will need to qualify for a trial. Is this something of tinterest?

## 2022-07-23 ENCOUNTER — Ambulatory Visit: Payer: Medicare Other | Attending: Cardiology

## 2022-07-23 DIAGNOSIS — Z7901 Long term (current) use of anticoagulants: Secondary | ICD-10-CM | POA: Insufficient documentation

## 2022-07-23 DIAGNOSIS — I4819 Other persistent atrial fibrillation: Secondary | ICD-10-CM | POA: Insufficient documentation

## 2022-07-23 DIAGNOSIS — I342 Nonrheumatic mitral (valve) stenosis: Secondary | ICD-10-CM

## 2022-07-23 DIAGNOSIS — E782 Mixed hyperlipidemia: Secondary | ICD-10-CM | POA: Diagnosis not present

## 2022-07-23 DIAGNOSIS — J849 Interstitial pulmonary disease, unspecified: Secondary | ICD-10-CM

## 2022-07-23 DIAGNOSIS — I110 Hypertensive heart disease with heart failure: Secondary | ICD-10-CM | POA: Diagnosis not present

## 2022-07-23 DIAGNOSIS — I421 Obstructive hypertrophic cardiomyopathy: Secondary | ICD-10-CM | POA: Diagnosis not present

## 2022-07-23 DIAGNOSIS — I25118 Atherosclerotic heart disease of native coronary artery with other forms of angina pectoris: Secondary | ICD-10-CM | POA: Diagnosis not present

## 2022-07-23 LAB — ECHOCARDIOGRAM COMPLETE
Area-P 1/2: 2.6 cm2
MV VTI: 1.12 cm2
P 1/2 time: 476 msec
S' Lateral: 2.6 cm

## 2022-07-24 DIAGNOSIS — I10 Essential (primary) hypertension: Secondary | ICD-10-CM | POA: Diagnosis not present

## 2022-07-24 DIAGNOSIS — I482 Chronic atrial fibrillation, unspecified: Secondary | ICD-10-CM | POA: Diagnosis not present

## 2022-07-24 DIAGNOSIS — R600 Localized edema: Secondary | ICD-10-CM | POA: Diagnosis not present

## 2022-07-24 DIAGNOSIS — I110 Hypertensive heart disease with heart failure: Secondary | ICD-10-CM | POA: Diagnosis not present

## 2022-07-24 DIAGNOSIS — J849 Interstitial pulmonary disease, unspecified: Secondary | ICD-10-CM | POA: Diagnosis not present

## 2022-07-24 DIAGNOSIS — J189 Pneumonia, unspecified organism: Secondary | ICD-10-CM | POA: Diagnosis not present

## 2022-07-24 DIAGNOSIS — J440 Chronic obstructive pulmonary disease with acute lower respiratory infection: Secondary | ICD-10-CM | POA: Diagnosis not present

## 2022-07-24 DIAGNOSIS — I5033 Acute on chronic diastolic (congestive) heart failure: Secondary | ICD-10-CM | POA: Diagnosis not present

## 2022-07-24 DIAGNOSIS — D72829 Elevated white blood cell count, unspecified: Secondary | ICD-10-CM | POA: Diagnosis not present

## 2022-07-24 DIAGNOSIS — J449 Chronic obstructive pulmonary disease, unspecified: Secondary | ICD-10-CM | POA: Diagnosis not present

## 2022-07-24 DIAGNOSIS — J9611 Chronic respiratory failure with hypoxia: Secondary | ICD-10-CM | POA: Diagnosis not present

## 2022-07-24 DIAGNOSIS — F411 Generalized anxiety disorder: Secondary | ICD-10-CM | POA: Diagnosis not present

## 2022-07-24 DIAGNOSIS — I422 Other hypertrophic cardiomyopathy: Secondary | ICD-10-CM | POA: Diagnosis not present

## 2022-07-24 DIAGNOSIS — E1165 Type 2 diabetes mellitus with hyperglycemia: Secondary | ICD-10-CM | POA: Diagnosis not present

## 2022-07-24 NOTE — Telephone Encounter (Signed)
Spoke to pt's wife and she stated that Christian Patton is dying he is no longer capable to drive, sit or stand he's not either. I gave pt's wife the information for clinical trials recmommended by Dr Marchelle Gearing. Pt's wife verbalized understanding and thanked Dr Marchelle Gearing for his care. Nothing further needed.

## 2022-07-28 DIAGNOSIS — E1165 Type 2 diabetes mellitus with hyperglycemia: Secondary | ICD-10-CM | POA: Diagnosis not present

## 2022-07-28 DIAGNOSIS — I110 Hypertensive heart disease with heart failure: Secondary | ICD-10-CM | POA: Diagnosis not present

## 2022-07-28 DIAGNOSIS — J189 Pneumonia, unspecified organism: Secondary | ICD-10-CM | POA: Diagnosis not present

## 2022-07-28 DIAGNOSIS — J440 Chronic obstructive pulmonary disease with acute lower respiratory infection: Secondary | ICD-10-CM | POA: Diagnosis not present

## 2022-07-28 DIAGNOSIS — I482 Chronic atrial fibrillation, unspecified: Secondary | ICD-10-CM | POA: Diagnosis not present

## 2022-07-28 DIAGNOSIS — I5033 Acute on chronic diastolic (congestive) heart failure: Secondary | ICD-10-CM | POA: Diagnosis not present

## 2022-07-29 DIAGNOSIS — I110 Hypertensive heart disease with heart failure: Secondary | ICD-10-CM | POA: Diagnosis not present

## 2022-07-29 DIAGNOSIS — Z9981 Dependence on supplemental oxygen: Secondary | ICD-10-CM | POA: Diagnosis not present

## 2022-07-29 DIAGNOSIS — E119 Type 2 diabetes mellitus without complications: Secondary | ICD-10-CM | POA: Diagnosis not present

## 2022-07-29 DIAGNOSIS — I2510 Atherosclerotic heart disease of native coronary artery without angina pectoris: Secondary | ICD-10-CM | POA: Diagnosis not present

## 2022-07-29 DIAGNOSIS — Z86711 Personal history of pulmonary embolism: Secondary | ICD-10-CM | POA: Diagnosis not present

## 2022-07-29 DIAGNOSIS — R918 Other nonspecific abnormal finding of lung field: Secondary | ICD-10-CM | POA: Diagnosis not present

## 2022-07-29 DIAGNOSIS — J8410 Pulmonary fibrosis, unspecified: Secondary | ICD-10-CM | POA: Diagnosis not present

## 2022-07-29 DIAGNOSIS — K7460 Unspecified cirrhosis of liver: Secondary | ICD-10-CM | POA: Diagnosis not present

## 2022-07-29 DIAGNOSIS — I4891 Unspecified atrial fibrillation: Secondary | ICD-10-CM | POA: Diagnosis not present

## 2022-07-29 DIAGNOSIS — L89623 Pressure ulcer of left heel, stage 3: Secondary | ICD-10-CM | POA: Diagnosis not present

## 2022-07-29 DIAGNOSIS — I429 Cardiomyopathy, unspecified: Secondary | ICD-10-CM | POA: Diagnosis not present

## 2022-07-29 DIAGNOSIS — I509 Heart failure, unspecified: Secondary | ICD-10-CM | POA: Diagnosis not present

## 2022-07-29 DIAGNOSIS — K219 Gastro-esophageal reflux disease without esophagitis: Secondary | ICD-10-CM | POA: Diagnosis not present

## 2022-07-30 DIAGNOSIS — I2510 Atherosclerotic heart disease of native coronary artery without angina pectoris: Secondary | ICD-10-CM | POA: Diagnosis not present

## 2022-07-30 DIAGNOSIS — I429 Cardiomyopathy, unspecified: Secondary | ICD-10-CM | POA: Diagnosis not present

## 2022-07-30 DIAGNOSIS — I509 Heart failure, unspecified: Secondary | ICD-10-CM | POA: Diagnosis not present

## 2022-07-30 DIAGNOSIS — I4891 Unspecified atrial fibrillation: Secondary | ICD-10-CM | POA: Diagnosis not present

## 2022-07-30 DIAGNOSIS — Z9981 Dependence on supplemental oxygen: Secondary | ICD-10-CM | POA: Diagnosis not present

## 2022-07-30 DIAGNOSIS — I110 Hypertensive heart disease with heart failure: Secondary | ICD-10-CM | POA: Diagnosis not present

## 2022-08-01 DIAGNOSIS — I509 Heart failure, unspecified: Secondary | ICD-10-CM | POA: Diagnosis not present

## 2022-08-01 DIAGNOSIS — I4891 Unspecified atrial fibrillation: Secondary | ICD-10-CM | POA: Diagnosis not present

## 2022-08-01 DIAGNOSIS — I429 Cardiomyopathy, unspecified: Secondary | ICD-10-CM | POA: Diagnosis not present

## 2022-08-01 DIAGNOSIS — I110 Hypertensive heart disease with heart failure: Secondary | ICD-10-CM | POA: Diagnosis not present

## 2022-08-01 DIAGNOSIS — I2510 Atherosclerotic heart disease of native coronary artery without angina pectoris: Secondary | ICD-10-CM | POA: Diagnosis not present

## 2022-08-01 DIAGNOSIS — Z9981 Dependence on supplemental oxygen: Secondary | ICD-10-CM | POA: Diagnosis not present

## 2022-08-02 DIAGNOSIS — I2510 Atherosclerotic heart disease of native coronary artery without angina pectoris: Secondary | ICD-10-CM | POA: Diagnosis not present

## 2022-08-02 DIAGNOSIS — I4891 Unspecified atrial fibrillation: Secondary | ICD-10-CM | POA: Diagnosis not present

## 2022-08-02 DIAGNOSIS — I509 Heart failure, unspecified: Secondary | ICD-10-CM | POA: Diagnosis not present

## 2022-08-02 DIAGNOSIS — Z9981 Dependence on supplemental oxygen: Secondary | ICD-10-CM | POA: Diagnosis not present

## 2022-08-02 DIAGNOSIS — I110 Hypertensive heart disease with heart failure: Secondary | ICD-10-CM | POA: Diagnosis not present

## 2022-08-02 DIAGNOSIS — I429 Cardiomyopathy, unspecified: Secondary | ICD-10-CM | POA: Diagnosis not present

## 2022-08-03 DIAGNOSIS — I4891 Unspecified atrial fibrillation: Secondary | ICD-10-CM | POA: Diagnosis not present

## 2022-08-03 DIAGNOSIS — I110 Hypertensive heart disease with heart failure: Secondary | ICD-10-CM | POA: Diagnosis not present

## 2022-08-03 DIAGNOSIS — I429 Cardiomyopathy, unspecified: Secondary | ICD-10-CM | POA: Diagnosis not present

## 2022-08-03 DIAGNOSIS — I2510 Atherosclerotic heart disease of native coronary artery without angina pectoris: Secondary | ICD-10-CM | POA: Diagnosis not present

## 2022-08-03 DIAGNOSIS — Z9981 Dependence on supplemental oxygen: Secondary | ICD-10-CM | POA: Diagnosis not present

## 2022-08-03 DIAGNOSIS — I509 Heart failure, unspecified: Secondary | ICD-10-CM | POA: Diagnosis not present

## 2022-08-08 ENCOUNTER — Other Ambulatory Visit (HOSPITAL_COMMUNITY): Payer: Medicare Other

## 2022-08-11 DEATH — deceased

## 2022-09-01 ENCOUNTER — Ambulatory Visit: Payer: Medicare Other | Admitting: Cardiology
